# Patient Record
Sex: Male | Born: 1965 | Race: White | Hispanic: No | Smoking: Former smoker
Health system: Southern US, Community
[De-identification: ages and names within clinical notes are randomized; demographics above are authoritative.]

## PROBLEM LIST (undated history)

## (undated) DIAGNOSIS — I5022 Chronic systolic (congestive) heart failure: Secondary | ICD-10-CM

## (undated) DIAGNOSIS — N200 Calculus of kidney: Secondary | ICD-10-CM

## (undated) DIAGNOSIS — E559 Vitamin D deficiency, unspecified: Secondary | ICD-10-CM

## (undated) DIAGNOSIS — Z7901 Long term (current) use of anticoagulants: Secondary | ICD-10-CM

## (undated) DIAGNOSIS — T884XXA Failed or difficult intubation, initial encounter: Secondary | ICD-10-CM

## (undated) DIAGNOSIS — Z79620 Long term (current) use of immunosuppressive biologic: Secondary | ICD-10-CM

## (undated) DIAGNOSIS — I428 Other cardiomyopathies: Secondary | ICD-10-CM

## (undated) DIAGNOSIS — I509 Heart failure, unspecified: Secondary | ICD-10-CM

## (undated) DIAGNOSIS — E119 Type 2 diabetes mellitus without complications: Secondary | ICD-10-CM

## (undated) DIAGNOSIS — F32A Depression, unspecified: Secondary | ICD-10-CM

## (undated) DIAGNOSIS — I4819 Other persistent atrial fibrillation: Secondary | ICD-10-CM

## (undated) DIAGNOSIS — E669 Obesity, unspecified: Secondary | ICD-10-CM

## (undated) DIAGNOSIS — I340 Nonrheumatic mitral (valve) insufficiency: Secondary | ICD-10-CM

## (undated) DIAGNOSIS — R7303 Prediabetes: Secondary | ICD-10-CM

## (undated) DIAGNOSIS — L400 Psoriasis vulgaris: Secondary | ICD-10-CM

## (undated) DIAGNOSIS — Z87442 Personal history of urinary calculi: Secondary | ICD-10-CM

## (undated) DIAGNOSIS — R079 Chest pain, unspecified: Secondary | ICD-10-CM

## (undated) DIAGNOSIS — G4733 Obstructive sleep apnea (adult) (pediatric): Secondary | ICD-10-CM

## (undated) DIAGNOSIS — G454 Transient global amnesia: Secondary | ICD-10-CM

## (undated) DIAGNOSIS — F419 Anxiety disorder, unspecified: Secondary | ICD-10-CM

## (undated) DIAGNOSIS — G709 Myoneural disorder, unspecified: Secondary | ICD-10-CM

## (undated) DIAGNOSIS — K760 Fatty (change of) liver, not elsewhere classified: Secondary | ICD-10-CM

## (undated) DIAGNOSIS — K8020 Calculus of gallbladder without cholecystitis without obstruction: Secondary | ICD-10-CM

## (undated) HISTORY — DX: Calculus of kidney: N20.0

## (undated) HISTORY — DX: Other persistent atrial fibrillation: I48.19

## (undated) HISTORY — DX: Prediabetes: R73.03

## (undated) HISTORY — PX: CARDIOVASCULAR STRESS TEST: SHX262

## (undated) HISTORY — DX: Obesity, unspecified: E66.9

## (undated) HISTORY — DX: Obstructive sleep apnea (adult) (pediatric): G47.33

## (undated) HISTORY — DX: Chest pain, unspecified: R07.9

## (undated) HISTORY — DX: Nonrheumatic mitral (valve) insufficiency: I34.0

## (undated) HISTORY — DX: Other cardiomyopathies: I42.8

## (undated) HISTORY — PX: TONSILLECTOMY AND ADENOIDECTOMY: SUR1326

## (undated) HISTORY — DX: Chronic systolic (congestive) heart failure: I50.22

## (undated) HISTORY — DX: Transient global amnesia: G45.4

## (undated) HISTORY — DX: Fatty (change of) liver, not elsewhere classified: K76.0

---

## 1998-08-29 HISTORY — PX: COLONOSCOPY: SHX174

## 2011-08-30 DIAGNOSIS — I4819 Other persistent atrial fibrillation: Secondary | ICD-10-CM

## 2011-08-30 HISTORY — DX: Other persistent atrial fibrillation: I48.19

## 2011-10-10 ENCOUNTER — Encounter (HOSPITAL_COMMUNITY): Payer: Self-pay | Admitting: Psychology

## 2011-10-10 ENCOUNTER — Ambulatory Visit (INDEPENDENT_AMBULATORY_CARE_PROVIDER_SITE_OTHER): Payer: PRIVATE HEALTH INSURANCE | Admitting: Psychology

## 2011-10-10 DIAGNOSIS — F329 Major depressive disorder, single episode, unspecified: Secondary | ICD-10-CM

## 2011-10-10 LAB — VITAMIN D 25 HYDROXY (VIT D DEFICIENCY, FRACTURES): Vit D, 25-Hydroxy: 19.1

## 2011-10-10 LAB — LIPID PANEL
Cholesterol, Total: 175
Direct LDL: 105
HDL: 51 mg/dL (ref 35–70)
Triglyceride fasting, serum: 93

## 2011-10-10 LAB — COMPREHENSIVE METABOLIC PANEL
Albumin: 4.1
Calcium: 9.2 mg/dL
Phosphorus: 3.7 mg/dL (ref 2.5–4.9)
Potassium: 4.8 mmol/L
Sodium: 143 mmol/L (ref 137–147)
Total Protein: 7.1 g/dL

## 2011-10-10 LAB — CBC: Hemoglobin: 15.4 g/dL (ref 13.5–17.5)

## 2011-10-10 LAB — LACTATE DEHYDROGENASE: LDH: 168 U/L

## 2011-10-10 LAB — HEMOGLOBIN A1C: A1c: 5.8
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
## 2011-10-25 ENCOUNTER — Encounter (HOSPITAL_COMMUNITY): Payer: Self-pay | Admitting: Psychology

## 2011-11-07 ENCOUNTER — Ambulatory Visit (INDEPENDENT_AMBULATORY_CARE_PROVIDER_SITE_OTHER): Payer: PRIVATE HEALTH INSURANCE | Admitting: Psychology

## 2011-11-07 DIAGNOSIS — F329 Major depressive disorder, single episode, unspecified: Secondary | ICD-10-CM

## 2011-11-08 ENCOUNTER — Encounter (HOSPITAL_COMMUNITY): Payer: Self-pay | Admitting: Psychology
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
## 2012-04-09 ENCOUNTER — Encounter: Payer: Self-pay | Admitting: Family Medicine

## 2012-04-09 ENCOUNTER — Ambulatory Visit (INDEPENDENT_AMBULATORY_CARE_PROVIDER_SITE_OTHER): Payer: PRIVATE HEALTH INSURANCE | Admitting: Family Medicine

## 2012-04-09 VITALS — BP 134/82 | HR 80 | Temp 98.2°F | Ht 69.0 in | Wt 217.8 lb

## 2012-04-09 DIAGNOSIS — F430 Acute stress reaction: Secondary | ICD-10-CM

## 2012-04-09 DIAGNOSIS — I480 Paroxysmal atrial fibrillation: Secondary | ICD-10-CM | POA: Insufficient documentation

## 2012-04-09 DIAGNOSIS — I499 Cardiac arrhythmia, unspecified: Secondary | ICD-10-CM

## 2012-04-09 DIAGNOSIS — F411 Generalized anxiety disorder: Secondary | ICD-10-CM | POA: Insufficient documentation

## 2012-04-09 DIAGNOSIS — I4821 Permanent atrial fibrillation: Secondary | ICD-10-CM | POA: Insufficient documentation

## 2012-04-09 DIAGNOSIS — Z23 Encounter for immunization: Secondary | ICD-10-CM

## 2012-04-09 DIAGNOSIS — F439 Reaction to severe stress, unspecified: Secondary | ICD-10-CM

## 2012-04-09 MED ORDER — ASPIRIN 81 MG PO TBEC
81.0000 mg | DELAYED_RELEASE_TABLET | Freq: Every day | ORAL | Status: DC
Start: 2012-04-09 — End: 2012-05-22

## 2012-04-09 MED ORDER — METOPROLOL TARTRATE 25 MG PO TABS: 25.0000 mg | ORAL_TABLET | Freq: Two times a day (BID) | ORAL | Status: DC | PRN

## 2012-04-09 NOTE — Progress Notes (Signed)
Subjective:    Patient ID: Stanley Kane, male    DOB: 04-15-1966, 46 y.o.   MRN: [id number]  HPI CC: new pt to establish  Father dx with liver cancer 1 yr ago - non drinker..  Recently several trips to hospital.  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   Increased stress recently with father and work.    Yesterday started with left posterior neck pain.  Using heating pad.  Prior loved to work out at gym, not in last few years.  Trying to start walking daily - actually lost 20lbs.  Stopped drinking soft drinks.  Increasing anxiety - with chest discomfort - described as tightness in mid chest, comes on with worsened anxiety.  Not exertional - doesn't happen when walking.  No associated SOB, nausea.  Sometimes with sleep initiation insomnia - feels worrying about things excessively.  Actually 1 wk ago did have episode of DOE when was walking down block to restaurant in [place].  First time this has happened.  His GF has told him for months that his heart at times feels like it's racing, but pt doesn't think really recognizes this.  Recently had blood work via company, told normal values. Recent stress test - at [facility], told arrhythmias but not to worry (?PAC/PVC).  Thinks done at [facility].  Have requested records today.  Body mass index is 32.16 kg/(m^2).  Caffeine: 1 cup coffee Lives with GF ([name] Smith), 1 dog Occupation: truck Hospital doctor Edu: 9th grade, GED Activity: enjoys gym but no time, starting walking Diet: good water, fruits/vegetables daily  Preventative: Blood work - done at work, will request records for [place].  Medications and allergies reviewed and updated in chart.  Past histories reviewed and updated if relevant as below. There is no problem list on file for this patient.  Past Medical History  Diagnosis Date  . Arrhythmia     "skips a beat occasionally"   Past Surgical History  Procedure Date  . Tonsillectomy and adenoidectomy childhood  . Cardiovascular stress  test ~2005    thinks at [facility], told normal   History  Substance Use Topics  . Smoking status: Former Smoker    Quit date: 08/29/1996  . Smokeless tobacco: Never Used  . Alcohol Use: 1.2 oz/week    2 Cans of beer per week     Occasional   Family History  Problem Relation Age of Onset  . Hypertension Father   . Cancer Father     liver  . Diabetes Mother   . Coronary artery disease Father 38  . Stroke Neg Hx    Allergies  Allergen Reactions  . Vicodin (Hydrocodone-Acetaminophen)     Makes him "jittery"   No current outpatient prescriptions on file prior to visit.     Review of Systems  Constitutional: Negative for fever, chills, activity change, appetite change, fatigue and unexpected weight change.  HENT: Negative for hearing loss and neck pain.   Eyes: Positive for visual disturbance (due for vision screen).  Respiratory: Positive for chest tightness (see HPI) and shortness of breath (rare - see HPI). Negative for cough and wheezing.   Cardiovascular: Negative for chest pain, palpitations and leg swelling.  Gastrointestinal: Negative for nausea, vomiting, abdominal pain, diarrhea, constipation, blood in stool and abdominal distention.  Genitourinary: Negative for hematuria and difficulty urinating.  Musculoskeletal: Negative for myalgias and arthralgias.  Skin: Negative for rash.  Neurological: Negative for dizziness, seizures, syncope and headaches.  Hematological: Does not bruise/bleed easily.  Psychiatric/Behavioral:  Negative for dysphoric mood. The patient is nervous/anxious.        Objective:   Physical Exam  Nursing note and vitals reviewed. Constitutional: He is oriented to person, place, and time. He appears well-developed and well-nourished. No distress.  HENT:  Head: Normocephalic and atraumatic.  Right Ear: External ear normal.  Left Ear: External ear normal.  Nose: Nose normal.  Mouth/Throat: Oropharynx is clear and moist.  Eyes: Conjunctivae and  EOM are normal. Pupils are equal, round, and reactive to light.  Neck: Normal range of motion. Neck supple. No thyromegaly present.  Cardiovascular: Normal rate, normal heart sounds and intact distal pulses.  An irregularly irregular rhythm present.  No murmur heard. Pulses:      Radial pulses are 2+ on the right side, and 2+ on the left side.  Pulmonary/Chest: Effort normal and breath sounds normal. No respiratory distress. He has no wheezes. He has no rales.  Abdominal: Soft. Bowel sounds are normal. He exhibits no distension and no mass. There is no tenderness. There is no rebound and no guarding.  Musculoskeletal: Normal range of motion. He exhibits no edema.  Lymphadenopathy:    He has no cervical adenopathy.  Neurological: He is alert and oriented to person, place, and time.       CN grossly intact, station and gait intact  Skin: Skin is warm and dry. No rash noted.  Psychiatric: He has a normal mood and affect. His behavior is normal. Judgment and thought content normal.      Assessment & Plan:  Tdap today.

## 2012-04-09 NOTE — Patient Instructions (Addendum)
Start baby aspirin 81mg  once daily. Use metoprolol to slow heart rate if feeling palpitations. Pass by Marion's office to schedule cardiology appointment. Return to see me in 3 months, sooner if needed. Good to meet you today, call us with questions. If worsening chest pressure, or shortness of breath, or speeding heart, seek urgent care.

## 2012-04-10 LAB — TSH: TSH: 1.75 u[IU]/mL (ref 0.35–5.50)

## 2012-04-10 NOTE — Assessment & Plan Note (Signed)
Anticipate stress contributing. Discussed healthy ways of coping with stress. Will w/u cardiac issue first, then reassess stress (anticipate improved sxs after treated)

## 2012-04-10 NOTE — Assessment & Plan Note (Addendum)
New afib, less likely atrial flutter, unclear how long but sounds like months in duration. Currently no evidence of decompensation so will pursue w/u outpatient. Have requested recent lab results from work, checked TSH today as well. EKG today - aAfib at rate of 108. Start aspirin at 81mg  daily, refer to cardiology for consideration of further treatment, likely will need echo. Given endorses h/o palpitations/racing heart, provided with metoprolol to take 25mg  1/2 to 1 pill prn tachycardia. Red flags to seek urgent care or ER discussed as well. Avoid restarting exercise routine until eval by cards.

## 2012-04-13 ENCOUNTER — Ambulatory Visit (INDEPENDENT_AMBULATORY_CARE_PROVIDER_SITE_OTHER): Payer: PRIVATE HEALTH INSURANCE | Admitting: Cardiovascular Disease

## 2012-04-13 ENCOUNTER — Encounter: Payer: Self-pay | Admitting: Cardiovascular Disease

## 2012-04-13 VITALS — BP 100/80 | HR 113 | Ht 69.0 in | Wt 218.0 lb

## 2012-04-13 DIAGNOSIS — F439 Reaction to severe stress, unspecified: Secondary | ICD-10-CM

## 2012-04-13 DIAGNOSIS — R0989 Other specified symptoms and signs involving the circulatory and respiratory systems: Secondary | ICD-10-CM

## 2012-04-13 DIAGNOSIS — R0602 Shortness of breath: Secondary | ICD-10-CM

## 2012-04-13 DIAGNOSIS — R079 Chest pain, unspecified: Secondary | ICD-10-CM

## 2012-04-13 DIAGNOSIS — F430 Acute stress reaction: Secondary | ICD-10-CM

## 2012-04-13 DIAGNOSIS — I4891 Unspecified atrial fibrillation: Secondary | ICD-10-CM

## 2012-04-13 DIAGNOSIS — R0683 Snoring: Secondary | ICD-10-CM | POA: Insufficient documentation

## 2012-04-13 MED ORDER — RIVAROXABAN 20 MG PO TABS: 20.0000 mg | ORAL_TABLET | Freq: Every day | ORAL | Status: DC

## 2012-04-13 MED ORDER — DIGOXIN 250 MCG PO TABS: 0.2500 mg | ORAL_TABLET | Freq: Every day | ORAL | Status: DC

## 2012-04-13 NOTE — Assessment & Plan Note (Addendum)
His wife reports significant snoring even despite recent 20 pound weight loss. Possible periods of apnea. We did discuss with him at some point possibly doing a sleep study. He has nasal congestion. We have suggested he try zyrtec. He also tried nasal strips. Perhaps when he has nasal congestion, his snoring might be worse. We did discuss that obstructive sleep apnea is a risk factor for recurrent atrial fibrillation.

## 2012-04-13 NOTE — Assessment & Plan Note (Addendum)
Blood pressure is low, unable to take regular metoprolol or calcium channel blocker. We'll start digoxin 0.25 mg daily. We'll check a digoxin level when he comes in for echocardiogram.  We'll start him on anticoagulation one month and then discuss further options with him. If he continues to be in atrial fibrillation, depending on his echocardiogram, options include antiarrhythmic medications and /or cardioversion

## 2012-04-13 NOTE — Assessment & Plan Note (Signed)
He does have significant stress, typically while driving. Uncertain if this could have contributed to his arrhythmia.   
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
## 2012-04-13 NOTE — Patient Instructions (Addendum)
Please start digoxin one a day (two the first day)  Start xarelto one a day for blood thinning Monitor your pulse   Take metoprolol 1/2 pill for significant shortness of breath or tachycardia  We will schedule you for an echocardiogram  Please call us if you have new issues that need to be addressed before your next appt.  Your physician wants you to follow-up in: 1 months.   Atrial Fibrillation (Irregular Heartbeat) Your caregiver has diagnosed you with atrial fibrillation (AFib). The heart normally beats very regularly; AFib is a type of irregular heartbeat. The heart rate may be faster or slower than normal. This can prevent your heart from pumping as well as it should. AFib can be constant (chronic) or intermittent (paroxysmal). CAUSES Atrial fibrillation may be caused by:  Heart disease, including heart attack, coronary artery disease, heart failure, diseases of the heart valves, and others.   Blood clot in the lungs (pulmonary embolism).   Pneumonia or other infections.   Chronic lung disease.   Thyroid disease.   Toxins. These include alcohol, some medications (such as decongestant medications or diet pills), and caffeine.  In some people, no cause for AFib can be found. This is referred to as Lone Atrial Fibrillation. SYMPTOMS  Palpitations or a fluttering in your chest.   A vague sense of chest discomfort.   Shortness of breath.   Sudden onset of lightheadedness or weakness.  Sometimes, the first sign of AFib can be a complication of the condition. This could be a stroke or heart failure. DIAGNOSIS Your description of your condition may make your caregiver suspicious of atrial fibrillation. Your caregiver will examine your pulse to determine if fibrillation is present. An EKG (electrocardiogram) will confirm the diagnosis. Further testing may help determine what caused you to have atrial fibrillation. This may include chest x-ray, echocardiogram, blood tests, or CT  scans. PREVENTION If you have previously had atrial fibrillation, your caregiver may advise you to avoid substances known to cause the condition (such as stimulant medications, and possibly caffeine or alcohol). You may be advised to use medications to prevent recurrence. Proper treatment of any underlying condition is important to help prevent recurrence. PROGNOSIS Atrial fibrillation does tend to become a chronic condition over time. It can cause significant complications (see below). Atrial fibrillation is not usually immediately life-threatening, but it can shorten your life expectancy. This seems to be worse in women. If you have lone atrial fibrillation and are under 69 years old, the risk of complications is very low, and life expectancy is not shortened. RISKS AND COMPLICATIONS Complications of atrial fibrillation can include stroke, chest pain, and heart failure. Your caregiver will recommend treatments for the atrial fibrillation, as well as for any underlying conditions, to help minimize risk of complications. TREATMENT Treatment for AFib is divided into several categories:  Treatment of any underlying condition.   Converting you out of AFib into a regular (sinus) rhythm.   Controlling rapid heart rate.   Prevention of blood clots and stroke.  Medications and procedures are available to convert your atrial fibrillation to sinus rhythm. However, recent studies have shown that this may not offer you any advantage, and cardiac experts are continuing research and debate on this topic. More important is controlling your rapid heartbeat. The rapid heartbeat causes more symptoms, and places strain on your heart. Your caregiver will advise you on the use of medications that can control your heart rate. Atrial fibrillation is a strong stroke risk. You can  lessen this risk by taking blood thinning medications such as Coumadin (warfarin), or sometimes aspirin. These medications need close  monitoring by your caregiver. Over-medication can cause bleeding. Too little medication may not protect against stroke. HOME CARE INSTRUCTIONS  If your caregiver prescribed medicine to make your heartbeat more normally, take as directed.   If blood thinners were prescribed by your caregiver, take EXACTLY as directed.   Perform blood tests EXACTLY as directed.   Quit smoking. Smoking increases your cardiac and lung (pulmonary) risks.   DO NOT drink alcohol.   DO NOT drink caffeinated drinks (e.g. coffee, pop, chocolate, and leaf teas). You may drink decaffeinated coffee, pop or tea.   If you are overweight, you should choose a reduced calorie diet to lose weight. Please see a registered dietitian if you need more information about healthy weight loss. DO NOT USE DIET PILLS as they may aggravate heart problems.   If you have other heart problems that are causing AFib, you may need to eat a low salt, fat, and cholesterol diet. Your caregiver will tell you if this is necessary.   Exercise every day to improve your physical fitness. Stay active unless advised otherwise.   If your caregiver has given you a follow-up appointment, it is very important to keep that appointment. Not keeping the appointment could result in heart failure or stroke. If there is any problem keeping the appointment, you must call back to this facility for assistance.  SEEK MEDICAL CARE IF:  You notice a change in the rate, rhythm or strength of your heartbeat.   You develop an infection or any other change in your overall health status.  SEEK IMMEDIATE MEDICAL CARE IF:  You develop chest pain, abdominal pain, sweating, weakness or feel sick to your stomach (nausea).   You develop shortness of breath.   You develop swollen feet and ankles.   You develop dizziness, numbness, or weakness of your face or limbs, or any change in vision or speech.  MAKE SURE YOU:  Understand these instructions.   Will watch your  condition.   Will get help right away if you are not doing well or get worse.  Document Released: 08/15/2005 Document Re-Released: 02/02/2010 [facility] Patient Information 2011 [place], [place].

## 2012-04-25 ENCOUNTER — Telehealth: Payer: Self-pay

## 2012-04-25 NOTE — Telephone Encounter (Signed)
Lets have pt come in for OV.  i could see tomorrow.

## 2012-04-25 NOTE — Telephone Encounter (Signed)
[name] left v/m pt having extreme anxiety; request low dose med for anxiety. Did not leave pharmacy info. Left v/m for candice to call back.

## 2012-04-25 NOTE — Telephone Encounter (Signed)
I had to leave a message for Stanley Kane advising he needs to be seen. Advised I placed him on schedule at 3:30 tomorrow to give him to get back. If he gets back earlier and needs earlier appt, then she could call and reschedule for tomorrow morning. Otherwise, keep appt for tomorrow afternoon or seek urgent care tonight.

## 2012-04-25 NOTE — Telephone Encounter (Signed)
Stanley Kane said stress level out the roof. 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 Pt has seen cardiologist and taking xarelto and digoxin. Pt is not taking metoprolol at this time.[facility].

## 2012-04-25 NOTE — Telephone Encounter (Signed)
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  He called her to say his stress level is "out the roof". She asks if there is any med for anxiety that can be called in for pt. I explained Dr. Mariah Milling is on vacation and usually does not prescribe these types of meds. I advised her to call PCP about anxiety.  Understanding verb.

## 2012-04-26 ENCOUNTER — Ambulatory Visit: Payer: Self-pay | Admitting: Family Medicine

## 2012-05-01 ENCOUNTER — Other Ambulatory Visit: Payer: Self-pay

## 2012-05-02 ENCOUNTER — Telehealth: Payer: Self-pay | Admitting: Cardiovascular Disease
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
## 2012-05-02 NOTE — Telephone Encounter (Signed)
Pt informed.  He wishes to keep 9/24 echo as scheduled but will call us sooner should his symptoms change/get worse.  He will try the 1/2 dose of metoprolol for elevated HR and palpitations.

## 2012-05-02 NOTE — Telephone Encounter (Signed)
Spoke with [name]; she states pt is feeling better but Stanley Kane will talk with pt to see if he feels he needs to come in for anxiety med. If pt needs appt [name] will call back to schedule.

## 2012-05-02 NOTE — Telephone Encounter (Signed)
LMTCB

## 2012-05-02 NOTE — Telephone Encounter (Signed)
FYI See below 

## 2012-05-02 NOTE — Telephone Encounter (Signed)
Pt GF called and states that pt is out on the road and states that a few days ago pt had severe chest pain with increased HR. No nitro or vomiting. Pt didn't take the metoprolol that was prescribed in the case of this happening.

## 2012-05-02 NOTE — Telephone Encounter (Signed)
Sounds good. Could repeat 1/2 metoprolol if needed if rate does not improve

## 2012-05-02 NOTE — Telephone Encounter (Signed)
Per Lela in [place], no echo's available for 9/6. I will call to inform pt.

## 2012-05-03 NOTE — Telephone Encounter (Signed)
Pt informed.  Understanding verb. Says he is feeling well today. He took 1 whole tablet of metoprolol yesterday and today.

## 2012-05-07 ENCOUNTER — Encounter: Payer: Self-pay | Admitting: Family Medicine

## 2012-05-21 ENCOUNTER — Ambulatory Visit: Payer: Self-pay | Admitting: Cardiovascular Disease

## 2012-05-22 ENCOUNTER — Ambulatory Visit (INDEPENDENT_AMBULATORY_CARE_PROVIDER_SITE_OTHER): Payer: PRIVATE HEALTH INSURANCE | Admitting: Cardiovascular Disease

## 2012-05-22 ENCOUNTER — Other Ambulatory Visit: Payer: Self-pay

## 2012-05-22 ENCOUNTER — Encounter: Payer: Self-pay | Admitting: Cardiovascular Disease

## 2012-05-22 ENCOUNTER — Other Ambulatory Visit (INDEPENDENT_AMBULATORY_CARE_PROVIDER_SITE_OTHER): Payer: PRIVATE HEALTH INSURANCE

## 2012-05-22 VITALS — BP 114/79 | HR 87 | Ht 69.0 in | Wt 218.5 lb

## 2012-05-22 DIAGNOSIS — R079 Chest pain, unspecified: Secondary | ICD-10-CM

## 2012-05-22 DIAGNOSIS — I4891 Unspecified atrial fibrillation: Secondary | ICD-10-CM

## 2012-05-22 DIAGNOSIS — R0602 Shortness of breath: Secondary | ICD-10-CM | POA: Insufficient documentation

## 2012-05-22 MED ORDER — AMIODARONE HCL 400 MG PO TABS: 400.0000 mg | ORAL_TABLET | Freq: Every day | ORAL | Status: DC

## 2012-05-22 NOTE — Patient Instructions (Addendum)
Please start amiodarone 400 mg twice a day for 5 days Then decrease the  Dose to 200 mg twice a day  We will schedule an ekg in 2 weeks  Please call us if you have new issues that need to be addressed before your next appt.  Your physician wants you to follow-up in: 1 months.

## 2012-05-22 NOTE — Progress Notes (Signed)
Patient ID: Stanley Kane, male    DOB: 28-Apr-1966, 47 y.o.   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  He reports that he is unable to tolerate metoprolol twice a day. He's been taking this once in the morning only. Heart rate in the morning is typically elevated in the 120 range when he gets up for his morning pill. He does have malaise, shortness of breath.  Continues To have significant stress while driving.  Echocardiogram today shows ejection fraction 50%, mild dilatation of the left atrium, no significant valvular disease   He reports having a stress test at [facility] 6 years ago. This was reportedly normal.  EKG today shows atrial fibrillation with rate 87 beats per minute, no significant ST or T wave changes   Outpatient Encounter Prescriptions as of 05/22/2012  Medication Sig Dispense Refill  . digoxin (LANOXIN) 0.25 MG tablet Take 1 tablet (0.25 mg total) by mouth daily.  30 tablet  6  . metoprolol tartrate (LOPRESSOR) 25 MG tablet Take 25 mg by mouth daily.      . Multiple Vitamins-Minerals (MULTIVITAMIN PO) Take 1 tablet by mouth daily.      . Rivaroxaban (XARELTO) 20 MG TABS Take 1 tablet (20 mg total) by mouth daily.  30 tablet  6    Review of Systems  Constitutional: Positive for fatigue.  HENT: Negative.   Eyes: Negative.   Respiratory: Positive for shortness of breath.   Cardiovascular: Positive for palpitations.  Gastrointestinal: Negative.   Musculoskeletal: Negative.   Skin: Negative.   Neurological: Negative.   Hematological: Negative.   Psychiatric/Behavioral: Negative.   All other systems reviewed and are negative.    BP 114/79  Pulse 87  Ht 5\' 9"  (1.753 m)  Wt 218 lb 8 oz (99.111 kg)  BMI 32.27 kg/m2  Physical Exam  Nursing note and vitals reviewed. Constitutional: He is  oriented to person, place, and time. He appears well-developed and well-nourished.  HENT:  Head: Normocephalic.  Nose: Nose normal.  Mouth/Throat: Oropharynx is clear and moist.  Eyes: Conjunctivae normal are normal. Pupils are equal, round, and reactive to light.  Neck: Normal range of motion. Neck supple. No JVD present.  Cardiovascular: S1 normal, S2 normal, normal heart sounds and intact distal pulses.  An irregularly irregular rhythm present. Tachycardia present.  Exam reveals no gallop and no friction rub.   No murmur heard. Pulmonary/Chest: Effort normal and breath sounds normal. No respiratory distress. He has no wheezes. He has no rales. He exhibits no tenderness.  Abdominal: Soft. Bowel sounds are normal. He exhibits no distension. There is no tenderness.  Musculoskeletal: Normal range of motion. He exhibits no edema and no tenderness.  Lymphadenopathy:    He has no cervical adenopathy.  Neurological: He is alert and oriented to person, place, and time. Coordination normal.  Skin: Skin is warm and dry. No rash noted. No erythema.  Psychiatric: He has a normal mood and affect. His behavior is normal. Judgment and thought content normal.           Assessment and Plan     Atrial fibrillation -  We'll start him on amiodarone 400 mg twice a day for 5 days then decrease the dose to 200 mg twice a day . EKG in 2 weeks' time. If he remains in atrial fibrillation, we'll schedule him  for cardioversion . Continue other current medications, try to increase metoprolol to twice a day .  Stress - He does have significant stress, typically while driving. Uncertain if this could have contributed to his arrhythmia.  Snoring -   We did discuss that obstructive sleep apnea is a risk factor for recurrent atrial fibrillation. He is not interested in a sleep study at this time   Shortness of breath  Likely secondary to underlying atrial fibrillation. Possible contribution from being  deconditioned and obese .

## 2012-05-28 NOTE — Telephone Encounter (Signed)
Pt wanted to clarify instructions for amiodarone. He says bottle says something different than AVS printout. I reviewed instructions with pt. He understands now and will call us when he is in town for EKG.  He mentions left chest/arm pain that gets worse with movement.  I reassured him this is usually non cardiac. He had a stress test in 2006 that was normal.  He will pay close att'n to these symptoms and let us know should these get worse/change.

## 2012-05-28 NOTE — Telephone Encounter (Signed)
Pt calling not sure of the directions of the amiodarone.

## 2012-05-28 NOTE — Telephone Encounter (Signed)
LMTCB

## 2012-05-28 NOTE — Telephone Encounter (Signed)
FYI See below 

## 2012-06-05 ENCOUNTER — Ambulatory Visit (INDEPENDENT_AMBULATORY_CARE_PROVIDER_SITE_OTHER): Payer: PRIVATE HEALTH INSURANCE

## 2012-06-05 VITALS — BP 110/72 | HR 65 | Ht 69.0 in | Wt 221.0 lb

## 2012-06-05 DIAGNOSIS — R0602 Shortness of breath: Secondary | ICD-10-CM

## 2012-06-05 DIAGNOSIS — I4891 Unspecified atrial fibrillation: Secondary | ICD-10-CM

## 2012-06-05 NOTE — Patient Instructions (Addendum)
We will tentatively schedule cardioversion for 06/27/12 We will call you to confirm  Return for labs 06/18/12

## 2012-06-05 NOTE — Progress Notes (Signed)
Pt here for EKG after having started amiodarone at last OV. Dr. Mariah Milling wanted pt to come in for EKG 2 weeks after start and schedule cardioversion if pt still in atrial fib. EKG shows atrial fib with a rate of 65 BPM.  Pt is asymptomatic. Says his HR has improved tremendously. Denies worsening sob or fatigue. He confirms compliance with xarelto as well. 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  We will tentatively arrange this and have him come in for labs 06/18/12. Understanding verb. We will give him detailed instruction re: cardioversion at 10/21 visit.

## 2012-06-06 ENCOUNTER — Other Ambulatory Visit: Payer: Self-pay

## 2012-06-06 ENCOUNTER — Telehealth: Payer: Self-pay

## 2012-06-06 DIAGNOSIS — I4891 Unspecified atrial fibrillation: Secondary | ICD-10-CM

## 2012-06-06 NOTE — Telephone Encounter (Signed)
Spoke with pt's fiance, who says pt has decided to not have cardioversion 10/30. They will be on vacation that week. They would like to have this done 07/06/12. This has been scheduled for 11/8 for Dr. Mariah Milling to do.  He will come for labs/CXR/EKG 07/02/12. Instructions mailed to pt.

## 2012-06-14 ENCOUNTER — Telehealth: Payer: Self-pay | Admitting: Cardiovascular Disease

## 2012-06-14 ENCOUNTER — Other Ambulatory Visit: Payer: Self-pay

## 2012-06-14 MED ORDER — METOPROLOL TARTRATE 25 MG PO TABS: 12.5000 mg | ORAL_TABLET | Freq: Every day | ORAL | Status: DC

## 2012-06-14 NOTE — Telephone Encounter (Signed)
Pt's fiance says pt has been very fatigued and even had to call out of work yesterday, which he never does.  I had her check his HR while I was on the phone. It is between 80-90 BPM.   I advised pt to decrease metoprolol to 12.5 mg daily for a few days and see if this helps symptoms. They will call us back should they not notice a change

## 2012-06-14 NOTE — Telephone Encounter (Signed)
Pt's fiance called to see if the patient can back off of his Metoprolol since it is making him very fatigued.  Please call pt to discuss options.

## 2012-06-20 ENCOUNTER — Other Ambulatory Visit: Payer: Self-pay

## 2012-06-27 NOTE — Addendum Note (Signed)
Addended by: Indiana Pechacek J on: 06/27/2012 12:30 AM   Modules accepted: Level of Service  

## 2012-07-02 ENCOUNTER — Other Ambulatory Visit: Payer: Self-pay

## 2012-07-02 ENCOUNTER — Ambulatory Visit (INDEPENDENT_AMBULATORY_CARE_PROVIDER_SITE_OTHER): Payer: PRIVATE HEALTH INSURANCE

## 2012-07-02 ENCOUNTER — Ambulatory Visit: Payer: Self-pay | Admitting: Cardiovascular Disease

## 2012-07-02 ENCOUNTER — Other Ambulatory Visit: Payer: Self-pay | Admitting: Cardiovascular Disease

## 2012-07-02 VITALS — BP 104/64 | HR 79 | Ht 69.0 in | Wt 216.0 lb

## 2012-07-02 DIAGNOSIS — I4891 Unspecified atrial fibrillation: Secondary | ICD-10-CM

## 2012-07-02 DIAGNOSIS — R0602 Shortness of breath: Secondary | ICD-10-CM

## 2012-07-03 LAB — CBC WITH DIFFERENTIAL
Basophils Absolute: 0.1 10*3/uL (ref 0.0–0.2)
Eosinophils Absolute: 0.1 10*3/uL (ref 0.0–0.4)
Immature Granulocytes: 0 % (ref 0–2)
Lymphocytes Absolute: 2.1 10*3/uL (ref 0.7–3.1)
MCH: 32.8 pg (ref 26.6–33.0)
MCHC: 34.3 g/dL (ref 31.5–35.7)
MCV: 96 fL (ref 79–97)
Monocytes Absolute: 1 10*3/uL — ABNORMAL HIGH (ref 0.1–0.9)
Platelets: 349 10*3/uL (ref 155–379)
RDW: 13.8 % (ref 12.3–15.4)

## 2012-07-03 LAB — BASIC METABOLIC PANEL
CO2: 18 mmol/L — ABNORMAL LOW (ref 19–28)
Calcium: 9.3 mg/dL (ref 8.7–10.2)
Glucose: 104 mg/dL — ABNORMAL HIGH (ref 65–99)
Potassium: 4.4 mmol/L (ref 3.5–5.2)
Sodium: 139 mmol/L (ref 134–144)

## 2012-07-06 ENCOUNTER — Ambulatory Visit: Payer: Self-pay | Admitting: Cardiovascular Disease

## 2012-07-06 DIAGNOSIS — I4891 Unspecified atrial fibrillation: Secondary | ICD-10-CM

## 2012-07-17 ENCOUNTER — Telehealth: Payer: Self-pay | Admitting: Cardiovascular Disease

## 2012-07-17 NOTE — Telephone Encounter (Signed)
Fiance called back He is seeing pcp Monday 11/25 at 0915 Wants to see Gollan same day Scheduled for 11/25 at 0745 Understanding verb

## 2012-07-17 NOTE — Telephone Encounter (Signed)
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 He will not know until later today when he is coming back in town His rate is ranging between 80-110 BPM He is asymptomatic and noticed this when he was checking his HR Fiance is also concerned, because prior to leaving for this trip, on Sunday, pt was under a lot of stress and stays stressed out with his job.  She thinks he needs RX for anxiety/stress management She feels until we get stress under better control, atrial fib will continue I explained atrial fib can be exacerbated by stress and may need med for this but Dr. Mariah Milling usually doesn't prescribe and may want to call PCP for this She voices understanding I explained pt is due for EKG anyway post cardioversion and should come in for this when he is back in town Fiance will call me this afternoon, as soon as she finds out when he will be back in town, to make that appt She will try to get appt with PCP same day I will await her call back

## 2012-07-17 NOTE — Telephone Encounter (Signed)
Pt fiance calling stating that pt is in afib and is out of town. Wants to know what she should do.

## 2012-07-23 ENCOUNTER — Ambulatory Visit: Payer: Self-pay | Admitting: Family Medicine

## 2012-07-23 ENCOUNTER — Ambulatory Visit: Payer: Self-pay | Admitting: Cardiovascular Disease

## 2012-07-23 NOTE — Patient Instructions (Signed)
EKG performed for atrial fib

## 2012-07-23 NOTE — Addendum Note (Signed)
Addended by: Antonieta Iba on: 07/23/2012 07:47 AM   Modules accepted: Level of Service

## 2012-09-05 ENCOUNTER — Ambulatory Visit: Payer: Self-pay | Admitting: Family Medicine

## 2012-09-21 ENCOUNTER — Encounter: Payer: Self-pay | Admitting: Cardiovascular Disease

## 2012-09-21 ENCOUNTER — Ambulatory Visit (INDEPENDENT_AMBULATORY_CARE_PROVIDER_SITE_OTHER): Payer: Self-pay | Admitting: Cardiovascular Disease

## 2012-09-21 VITALS — BP 110/72 | HR 74 | Ht 69.0 in | Wt 221.0 lb

## 2012-09-21 DIAGNOSIS — R0683 Snoring: Secondary | ICD-10-CM

## 2012-09-21 DIAGNOSIS — I4891 Unspecified atrial fibrillation: Secondary | ICD-10-CM

## 2012-09-21 DIAGNOSIS — Z733 Stress, not elsewhere classified: Secondary | ICD-10-CM

## 2012-09-21 DIAGNOSIS — R0609 Other forms of dyspnea: Secondary | ICD-10-CM

## 2012-09-21 DIAGNOSIS — R0602 Shortness of breath: Secondary | ICD-10-CM

## 2012-09-21 DIAGNOSIS — F439 Reaction to severe stress, unspecified: Secondary | ICD-10-CM

## 2012-09-21 MED ORDER — DABIGATRAN ETEXILATE MESYLATE 150 MG PO CAPS: 150.0000 mg | ORAL_CAPSULE | Freq: Two times a day (BID) | ORAL | Status: DC

## 2012-09-21 NOTE — Assessment & Plan Note (Signed)
Back in atrial fibrillation. Uncertain if secondary to untreated sleep apnea or other trigger such as stress while driving. We'll restart anticoagulation, pradaxa , 150 mg twice a day. EKG in one month. Stay on amiodarone. We'll reconsider cardioversion at that time.

## 2012-09-21 NOTE — Assessment & Plan Note (Signed)
Palpitations and mild shortness of breath likely secondary to underlying arrhythmia. Would repeat cardioversion if he is willing in one month.

## 2012-09-21 NOTE — Assessment & Plan Note (Addendum)
We'll need to discuss sleep study with him. Encouraged weight loss.
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
## 2012-09-21 NOTE — Assessment & Plan Note (Signed)
He does have significant stress, typically while driving. Uncertain if this could have contributed to his arrhythmia.

## 2012-09-21 NOTE — Patient Instructions (Addendum)
You are doing well. Please start pradaxa twice a day  Please call us if you have new issues that need to be addressed before your next appt.  Your physician wants you to follow-up in: 1 months for EKG, discuss cardioversion You will receive a reminder letter in the mail two months in advance. If you don't receive a letter, please call our office to schedule the follow-up appointment.

## 2012-10-26 ENCOUNTER — Ambulatory Visit: Payer: Self-pay | Admitting: Cardiovascular Disease

## 2012-10-26 ENCOUNTER — Encounter: Payer: Self-pay | Admitting: *Deleted

## 2012-11-03 ENCOUNTER — Other Ambulatory Visit: Payer: Self-pay | Admitting: Cardiovascular Disease

## 2012-11-23 ENCOUNTER — Ambulatory Visit (INDEPENDENT_AMBULATORY_CARE_PROVIDER_SITE_OTHER): Payer: BC Managed Care – PPO | Admitting: Cardiovascular Disease

## 2012-11-23 ENCOUNTER — Encounter: Payer: Self-pay | Admitting: Cardiovascular Disease

## 2012-11-23 VITALS — BP 114/74 | HR 85 | Ht 69.0 in | Wt 216.0 lb

## 2012-11-23 DIAGNOSIS — R0602 Shortness of breath: Secondary | ICD-10-CM

## 2012-11-23 DIAGNOSIS — I4891 Unspecified atrial fibrillation: Secondary | ICD-10-CM

## 2012-11-23 DIAGNOSIS — Z733 Stress, not elsewhere classified: Secondary | ICD-10-CM

## 2012-11-23 DIAGNOSIS — F439 Reaction to severe stress, unspecified: Secondary | ICD-10-CM

## 2012-11-23 DIAGNOSIS — R0989 Other specified symptoms and signs involving the circulatory and respiratory systems: Secondary | ICD-10-CM

## 2012-11-23 DIAGNOSIS — R0683 Snoring: Secondary | ICD-10-CM

## 2012-11-23 MED ORDER — AMIODARONE HCL 200 MG PO TABS: 200.0000 mg | ORAL_TABLET | Freq: Two times a day (BID) | ORAL | Status: DC

## 2012-11-23 NOTE — Assessment & Plan Note (Signed)
Worsening shortness of breath since he is back in atrial fibrillation. He is interested in restoring normal sinus rhythm.

## 2012-11-23 NOTE — Assessment & Plan Note (Signed)
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 Uncertain if this is contributing to recurrence of his arrhythmia versus underlying sleep apnea

## 2012-11-23 NOTE — Assessment & Plan Note (Signed)
He is interested in cardioversion. We will try pharmacologic cardioversion. He would take amiodarone 400 mg twice a day for 5 days then down to 200 mg twice a day with EKG in 10 days to 2 weeks. If he remains in atrial fibrillation, we will schedule cardioversion.

## 2012-11-23 NOTE — Assessment & Plan Note (Signed)
Sleep apnea could continue to be a problem. He is not interested in sleep study at this time

## 2012-11-23 NOTE — Patient Instructions (Addendum)
Please take amiodarone 400 mg twice a day for five days Then decrease amiodarone to 200 mg twice a day  EKG in 10 days to 2 weeks If still in atrial fibrillation, We will schedule a cardioversion  Please call us if you have new issues that need to be addressed before your next appt.  Your physician wants you to follow-up in: 6 weeks

## 2012-11-23 NOTE — Progress Notes (Signed)
Patient ID: Stanley Kane, male    DOB: Jul 17, 1966, 47 y.o.   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 Develop atrial fibrillation in 2013 with cardioversion 07/06/2012, successful restoring of his normal sinus rhythm.  Last clinic visit with recurrent atrial fibrillation started back on anticoagulation.   Previously unable to tolerate metoprolol twice a day. He's taking one half dose twice a day . Suspected obstructive sleep apnea, not on CPAP . Sleep apnea noted during anesthesia of cardioversion Significant stress and anxiety when driving his stroke in the [place] area . Wife reports he has anger issues when driving .  He went for routine DOT physical and this was denied as he was in atrial fibrillation and not on anticoagulation .   Prior  Echocardiogram showed ejection fraction 50%, mild dilatation of the left atrium, no significant valvular disease  He reports having a stress test at [place] 6 years ago. This was reportedly normal.  EKG today shows atrial fibrillation with rate 85 beats per minute, no significant ST or T wave changes   Outpatient Encounter Prescriptions as of 11/23/2012  Medication Sig Dispense Refill  . amiodarone (PACERONE) 200 MG tablet Take 1 tablet (200 mg total) by mouth 2 (two) times daily.  60 tablet  6  . dabigatran (PRADAXA) 150 MG CAPS Take 1 capsule (150 mg total) by mouth every 12 (twelve) hours.  60 capsule  11  . digoxin (LANOXIN) 0.25 MG tablet Take 1 tablet (0.25 mg total) by mouth daily.  30 tablet  6  . metoprolol tartrate (LOPRESSOR) 25 MG tablet Take 0.5 tablets (12.5 mg total) by mouth daily.  15 tablet  3  . Multiple Vitamins-Minerals (MULTIVITAMIN PO) Take 1 tablet by mouth daily.        Review of Systems  Constitutional: Positive for fatigue.  HENT: Negative.    Eyes: Negative.   Respiratory: Positive for shortness of breath.   Cardiovascular: Positive for palpitations.  Gastrointestinal: Negative.   Musculoskeletal: Negative.   Skin: Negative.   Neurological: Negative.   Psychiatric/Behavioral: Negative.   All other systems reviewed and are negative.    BP 114/74  Pulse 85  Ht 5\' 9"  (1.753 m)  Wt 216 lb (97.977 kg)  BMI 31.88 kg/m2  Physical Exam  Nursing note and vitals reviewed. Constitutional: He is oriented to person, place, and time. He appears well-developed and well-nourished.  HENT:  Head: Normocephalic.  Nose: Nose normal.  Mouth/Throat: Oropharynx is clear and moist.  Eyes: Conjunctivae are normal. Pupils are equal, round, and reactive to light.  Neck: Normal range of motion. Neck supple. No JVD present.  Cardiovascular: S1 normal, S2 normal, normal heart sounds and intact distal pulses.  An irregularly irregular rhythm present. Tachycardia present.  Exam reveals no gallop and no friction rub.   No murmur heard. Pulmonary/Chest: Effort normal and breath sounds normal. No respiratory distress. He has no wheezes. He has no rales. He exhibits no tenderness.  Abdominal: Soft. Bowel sounds are normal. He exhibits no distension. There is no tenderness.  Musculoskeletal: Normal range of motion. He exhibits no edema and no tenderness.  Lymphadenopathy:    He has no cervical adenopathy.  Neurological: He is alert and oriented to person, place, and time. Coordination normal.  Skin: Skin is warm and dry. No rash noted. No erythema.  Psychiatric: He  has a normal mood and affect. His behavior is normal. Judgment and thought content normal.      Assessment and Plan

## 2012-11-30 ENCOUNTER — Telehealth: Payer: Self-pay

## 2012-11-30 NOTE — Telephone Encounter (Signed)
Is pt cleared for dental work? Hold anticoag?

## 2012-12-03 NOTE — Telephone Encounter (Signed)
LMTCB

## 2012-12-03 NOTE — Telephone Encounter (Signed)
Do not hold anticoagulation Scheduling for cardioversion if he does not convert on amio Would do cleaning at a later date or do cleaning and dont stop anticoagulation

## 2012-12-05 NOTE — Telephone Encounter (Signed)
lmtcb

## 2012-12-12 ENCOUNTER — Ambulatory Visit (INDEPENDENT_AMBULATORY_CARE_PROVIDER_SITE_OTHER): Payer: BC Managed Care – PPO

## 2012-12-12 VITALS — BP 120/84 | HR 74 | Ht 69.0 in | Wt 224.0 lb

## 2012-12-12 DIAGNOSIS — I4891 Unspecified atrial fibrillation: Secondary | ICD-10-CM

## 2012-12-12 NOTE — Telephone Encounter (Signed)
lmtcb YN:WGNF to have ekg asap

## 2012-12-12 NOTE — Progress Notes (Signed)
Pt here for EKG post amiodarone load No complaints EKG performed Shows atrial fibrillation Per Dr. Windell Hummingbird last note, cardioversion was scheduled for 5/9 He will come for labs and EKG 5/8 Verbal instructions given

## 2012-12-12 NOTE — Patient Instructions (Addendum)
See cardioversion instructions given to you

## 2012-12-31 ENCOUNTER — Telehealth: Payer: Self-pay

## 2012-12-31 NOTE — Telephone Encounter (Signed)
FYI: Pt called to cancel cardioversion scheduled for 01/04/13 due to family emergency He will call back next week  to r/s

## 2013-01-04 ENCOUNTER — Ambulatory Visit: Payer: Self-pay | Admitting: Cardiovascular Disease

## 2013-02-01 ENCOUNTER — Ambulatory Visit: Payer: Self-pay | Admitting: Cardiovascular Disease

## 2013-02-09 ENCOUNTER — Other Ambulatory Visit: Payer: Self-pay | Admitting: Cardiovascular Disease

## 2013-02-11 ENCOUNTER — Other Ambulatory Visit: Payer: Self-pay | Admitting: *Deleted

## 2013-02-11 MED ORDER — DIGOXIN 250 MCG PO TABS: 0.2500 mg | ORAL_TABLET | Freq: Every day | ORAL | Status: DC

## 2013-02-11 NOTE — Telephone Encounter (Signed)
Refilled Digoxin sent to [facility].

## 2013-03-04 ENCOUNTER — Ambulatory Visit (INDEPENDENT_AMBULATORY_CARE_PROVIDER_SITE_OTHER): Payer: BC Managed Care – PPO | Admitting: Cardiovascular Disease

## 2013-03-04 ENCOUNTER — Encounter: Payer: Self-pay | Admitting: Cardiovascular Disease

## 2013-03-04 VITALS — BP 110/78 | HR 66 | Ht 70.0 in | Wt 209.2 lb

## 2013-03-04 DIAGNOSIS — I4891 Unspecified atrial fibrillation: Secondary | ICD-10-CM

## 2013-03-04 DIAGNOSIS — R0602 Shortness of breath: Secondary | ICD-10-CM

## 2013-03-04 MED ORDER — DABIGATRAN ETEXILATE MESYLATE 150 MG PO CAPS: 150.0000 mg | ORAL_CAPSULE | Freq: Two times a day (BID) | ORAL | Status: DC

## 2013-03-04 MED ORDER — DIGOXIN 250 MCG PO TABS: 0.2500 mg | ORAL_TABLET | Freq: Every day | ORAL | Status: DC

## 2013-03-04 MED ORDER — METOPROLOL TARTRATE 25 MG PO TABS: 25.0000 mg | ORAL_TABLET | Freq: Every day | ORAL | Status: DC

## 2013-03-04 NOTE — Patient Instructions (Addendum)
You are doing well. No medication changes were made.  Continue to monitor your rhythm, Cal the office if you think you are in atrial fibrillation and restart pradaxa  Please call us if you have new issues that need to be addressed before your next appt.  Your physician wants you to follow-up in: 6 months.  You will receive a reminder letter in the mail two months in advance. If you don't receive a letter, please call our office to schedule the follow-up appointment.

## 2013-03-04 NOTE — Assessment & Plan Note (Signed)
Maintaining normal sinus rhythm on today's visit. Possibly from recent weight loss,  may be with improved sleep apnea. He stay on digoxin and metoprolol. I've asked them to closely monitor his heart rate with a pulse oximeter. If he can closely monitor this, he could hold his anticoagulation . Resting to come back into the office for any recurrent arrhythmia .

## 2013-03-04 NOTE — Progress Notes (Signed)
Patient ID: Stanley Kane, male    DOB: May 22, 1966, 47 y.o.   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 Developing atrial fibrillation in 2013 with cardioversion 07/06/2012, successful restoring of his normal sinus rhythm.  Previous clinic visit with recurrent atrial fibrillation started back on anticoagulation.   Suspected obstructive sleep apnea, not on CPAP . Sleep apnea noted during anesthesia of cardioversion Significant stress and anxiety when driving his stroke in the [place] area . Wife reports he has anger issues when driving . He went for routine DOT physical and this was denied as he was in atrial fibrillation and not on anticoagulation .   In followup today, he is in normal sinus rhythm. He lists previous visit for cardioversion. His weight is down 25 pounds. He is trying to watch his diet. Overall he feels well with no complaints. He stopped taking amiodarone, only takes anticoagulation, metoprolol, digoxin. He's been out of his metoprolol for several days.  Prior  Echocardiogram showed ejection fraction 50%, mild dilatation of the left atrium, no significant valvular disease  He reports having a stress test at [place] 6 years ago. This was reportedly normal.  EKG shows normal sinus rhythm with rate 65 beats a minute, no significant ST or T wave changes   Outpatient Encounter Prescriptions as of 03/04/2013  Medication Sig Dispense Refill  . dabigatran (PRADAXA) 150 MG CAPS Take 1 capsule (150 mg total) by mouth every 12 (twelve) hours.  60 capsule  11  . digoxin (LANOXIN) 0.25 MG tablet Take 1 tablet (0.25 mg total) by mouth daily.  30 tablet  3  . metoprolol tartrate (LOPRESSOR) 25 MG tablet Take 0.5 tablets (12.5 mg total) by mouth daily.  15 tablet  3  . Multiple Vitamins-Minerals (MULTIVITAMIN PO) Take 1  tablet by mouth daily.      . [DISCONTINUED] amiodarone (PACERONE) 200 MG tablet Take 1 tablet (200 mg total) by mouth 2 (two) times daily.  60 tablet  6   No facility-administered encounter medications on file as of 03/04/2013.     Review of Systems  HENT: Negative.   Eyes: Negative.   Gastrointestinal: Negative.   Musculoskeletal: Negative.   Skin: Negative.   Neurological: Negative.   Psychiatric/Behavioral: Negative.   All other systems reviewed and are negative.    BP 110/78  Pulse 66  Ht 5\' 10"  (1.778 m)  Wt 209 lb 4 oz (94.915 kg)  BMI 30.02 kg/m2  Physical Exam  Nursing note and vitals reviewed. Constitutional: He is oriented to person, place, and time. He appears well-developed and well-nourished.  HENT:  Head: Normocephalic.  Nose: Nose normal.  Mouth/Throat: Oropharynx is clear and moist.  Eyes: Conjunctivae are normal. Pupils are equal, round, and reactive to light.  Neck: Normal range of motion. Neck supple. No JVD present.  Cardiovascular: S1 normal, S2 normal, normal heart sounds and intact distal pulses.  An irregularly irregular rhythm present. Tachycardia present.  Exam reveals no gallop and no friction rub.   No murmur heard. Pulmonary/Chest: Effort normal and breath sounds normal. No respiratory distress. He has no wheezes. He has no rales. He exhibits no tenderness.  Abdominal: Soft. Bowel sounds are normal. He exhibits no distension. There is no tenderness.  Musculoskeletal: Normal range of motion. He exhibits no edema and no tenderness.  Lymphadenopathy:    He has  no cervical adenopathy.  Neurological: He is alert and oriented to person, place, and time. Coordination normal.  Skin: Skin is warm and dry. No rash noted. No erythema.  Psychiatric: He has a normal mood and affect. His behavior is normal. Judgment and thought content normal.      Assessment and Plan

## 2014-05-26 ENCOUNTER — Ambulatory Visit: Payer: Self-pay | Admitting: Family Medicine

## 2014-06-02 ENCOUNTER — Ambulatory Visit (INDEPENDENT_AMBULATORY_CARE_PROVIDER_SITE_OTHER): Payer: No Typology Code available for payment source | Admitting: Cardiovascular Disease

## 2014-06-02 ENCOUNTER — Encounter: Payer: Self-pay | Admitting: Cardiovascular Disease

## 2014-06-02 VITALS — BP 104/74 | HR 138 | Ht 69.0 in | Wt 249.5 lb

## 2014-06-02 DIAGNOSIS — E669 Obesity, unspecified: Secondary | ICD-10-CM | POA: Insufficient documentation

## 2014-06-02 DIAGNOSIS — I4891 Unspecified atrial fibrillation: Secondary | ICD-10-CM

## 2014-06-02 DIAGNOSIS — F439 Reaction to severe stress, unspecified: Secondary | ICD-10-CM

## 2014-06-02 DIAGNOSIS — Z658 Other specified problems related to psychosocial circumstances: Secondary | ICD-10-CM

## 2014-06-02 DIAGNOSIS — Z6835 Body mass index (BMI) 35.0-35.9, adult: Secondary | ICD-10-CM | POA: Insufficient documentation

## 2014-06-02 DIAGNOSIS — R0683 Snoring: Secondary | ICD-10-CM

## 2014-06-02 DIAGNOSIS — R0602 Shortness of breath: Secondary | ICD-10-CM

## 2014-06-02 DIAGNOSIS — E66811 Obesity, class 1: Secondary | ICD-10-CM | POA: Insufficient documentation

## 2014-06-02 MED ORDER — RIVAROXABAN 20 MG PO TABS: 20.0000 mg | ORAL_TABLET | Freq: Every day | ORAL | Status: DC

## 2014-06-02 MED ORDER — DIGOXIN 250 MCG PO TABS: 0.2500 mg | ORAL_TABLET | Freq: Every day | ORAL | Status: DC

## 2014-06-02 MED ORDER — METOPROLOL SUCCINATE ER 50 MG PO TB24: 50.0000 mg | ORAL_TABLET | Freq: Every day | ORAL | Status: DC

## 2014-06-02 NOTE — Progress Notes (Signed)
Patient ID: Stanley ChihuahuaWesley E Stratmann, male    DOB: [birth year], 48 y.o.   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 Developing atrial fibrillation in 2013 with cardioversion 07/06/2012, successful restoring of his normal sinus rhythm.  Previous clinic visit with recurrent atrial fibrillation started back on anticoagulation.  Suspected obstructive sleep apnea, not on CPAP . Sleep apnea noted during anesthesia of cardioversion  Significant stress and anxiety when driving his stroke in the [place] area . Wife reports he has anger issues when driving . He  previously went for routine DOT physical and this was denied as he was in atrial fibrillation and not on anticoagulation .   In followup today, he has noticed tachycardia and palpitations for the past several weeks, possibly months. He did not seek her care until today. He has had increasing shortness of breath particularly with exertion. Clearly symptomatic from his fast heart rate. Is not taking any medications and reports he was confused and thought he was supposed to stop everything He denies any significant chest pain lightheadedness or dizziness Weight continues to trend upwards  Prior  Echocardiogram showed ejection fraction 50%, mild dilatation of the left atrium, no significant valvular disease  He reports having a stress test at [facility] 6 years ago. This was reportedly normal.  EKG shows  atrial fibrillation with rate 138 beats a minute, no significant ST or T wave changes   Outpatient Encounter Prescriptions as of 06/02/2014  Medication Sig  . dabigatran (PRADAXA) 150 MG CAPS Take 1 capsule (150 mg total) by mouth every 12 (twelve) hours.  . digoxin (LANOXIN) 0.25 MG tablet Take 1 tablet (0.25 mg total) by mouth daily.  . metoprolol tartrate (LOPRESSOR) 25 MG tablet  Take 1 tablet (25 mg total) by mouth daily.  . Multiple Vitamins-Minerals (MULTIVITAMIN PO) Take 1 tablet by mouth daily.    Review of Systems  Constitutional: Negative.   HENT: Negative.   Eyes: Negative.   Respiratory: Positive for shortness of breath.   Cardiovascular: Positive for palpitations.  Gastrointestinal: Negative.   Endocrine: Negative.   Musculoskeletal: Negative.   Skin: Negative.   Allergic/Immunologic: Negative.   Neurological: Negative.   Hematological: Negative.   Psychiatric/Behavioral: Negative.   All other systems reviewed and are negative.   BP 104/74  Pulse 138  Ht 5\' 9"  (1.753 m)  Wt 249 lb 8 oz (113.172 kg)  BMI 36.83 kg/m2  Physical Exam  Nursing note and vitals reviewed. Constitutional: He is oriented to person, place, and time. He appears well-developed and well-nourished.  HENT:  Head: Normocephalic.  Nose: Nose normal.  Mouth/Throat: Oropharynx is clear and moist.  Eyes: Conjunctivae are normal. Pupils are equal, round, and reactive to light.  Neck: Normal range of motion. Neck supple. No JVD present.  Cardiovascular: S1 normal, S2 normal, normal heart sounds and intact distal pulses.  An irregularly irregular rhythm present. Tachycardia present.  Exam reveals no gallop and no friction rub.   No murmur heard. Pulmonary/Chest: Effort normal and breath sounds normal. No respiratory distress. He has no wheezes. He has no rales. He exhibits no tenderness.  Abdominal: Soft. Bowel sounds are normal. He exhibits no distension. There is no tenderness.  Musculoskeletal: Normal range of motion. He exhibits no edema and no tenderness.  Lymphadenopathy:    He has no cervical adenopathy.  Neurological: He is alert  and oriented to person, place, and time. Coordination normal.  Skin: Skin is warm and dry. No rash noted. No erythema.  Psychiatric: He has a normal mood and affect. His behavior is normal. Judgment and thought content normal.       Assessment and Plan

## 2014-06-02 NOTE — Assessment & Plan Note (Signed)
Likely has obstructive sleep apnea. Encouraged him again that he would likely benefit from a sleep study and CPAP

## 2014-06-02 NOTE — Assessment & Plan Note (Signed)
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

## 2014-06-02 NOTE — Assessment & Plan Note (Signed)
Acute onset of shortness of breath likely from atrial fibrillation with rapid rate. No clear signs of fluid retention, lungs are clear. Lasix may be needed for shortness of breath persist despite heart rate control

## 2014-06-02 NOTE — Assessment & Plan Note (Signed)
Weight continues to be a major issue. poor lifestyle, not exercising. Counseled him on watching his diet

## 2014-06-02 NOTE — Patient Instructions (Signed)
Please start metoprolol once a day and digoxin one a day for heart rate control Please start xarelto one a day for blood thinner  Please call us if you have new issues that need to be addressed before your next appt.  Your physician wants you to follow-up in: 1 months.

## 2014-06-02 NOTE — Assessment & Plan Note (Addendum)
He is back in atrial fibrillation today and has likely been having arrhythmia on a consistent basis for several weeks to month or 2. We will restart his anticoagulation with xarelto 20 mg daily. We've also restarted his metoprolol 25 mg twice a day, and digoxin 0.25 mg daily After 3 weeks or so, we could try flecainide or amiodarone in an attempt to convert him to normal sinus rhythm His obstructive sleep apnea and lack of CPAP is likely contributing to his symptoms

## 2014-06-03 ENCOUNTER — Telehealth: Payer: Self-pay

## 2014-06-03 NOTE — Telephone Encounter (Signed)
Pt states Xarelto is too expensive, would like an alternative. Please call.

## 2014-06-03 NOTE — Telephone Encounter (Signed)
With the coupon for xarelto , should be less than $10 Only other option is warfarin

## 2014-06-03 NOTE — Telephone Encounter (Signed)
Spoke w/ pt.  Advised him of Dr. Windell HummingbirdGollan's recommendation.  He is agreeable to continuing xarelto for now.  He requests a coupon and samples, if possible.  Advised him that I am leaving these at front desk to pick up at his convenience.

## 2014-06-04 ENCOUNTER — Encounter: Payer: Self-pay | Admitting: Cardiovascular Disease

## 2014-06-16 ENCOUNTER — Ambulatory Visit (INDEPENDENT_AMBULATORY_CARE_PROVIDER_SITE_OTHER): Payer: No Typology Code available for payment source | Admitting: Family Medicine

## 2014-06-16 ENCOUNTER — Ambulatory Visit (INDEPENDENT_AMBULATORY_CARE_PROVIDER_SITE_OTHER)
Admission: RE | Admit: 2014-06-16 | Discharge: 2014-06-16 | Disposition: A | Discharge status: Home / Self Care | Payer: No Typology Code available for payment source | Source: Ambulatory Visit | Attending: Family Medicine | Admitting: Family Medicine

## 2014-06-16 ENCOUNTER — Encounter: Payer: Self-pay | Admitting: Family Medicine

## 2014-06-16 ENCOUNTER — Telehealth: Payer: Self-pay

## 2014-06-16 VITALS — BP 116/84 | HR 84 | Temp 97.9°F | Ht 70.0 in | Wt 252.0 lb

## 2014-06-16 DIAGNOSIS — F439 Reaction to severe stress, unspecified: Secondary | ICD-10-CM

## 2014-06-16 DIAGNOSIS — E669 Obesity, unspecified: Secondary | ICD-10-CM

## 2014-06-16 DIAGNOSIS — I481 Persistent atrial fibrillation: Secondary | ICD-10-CM

## 2014-06-16 DIAGNOSIS — R7309 Other abnormal glucose: Secondary | ICD-10-CM

## 2014-06-16 DIAGNOSIS — R1011 Right upper quadrant pain: Secondary | ICD-10-CM

## 2014-06-16 DIAGNOSIS — M79644 Pain in right finger(s): Secondary | ICD-10-CM

## 2014-06-16 DIAGNOSIS — I4819 Other persistent atrial fibrillation: Secondary | ICD-10-CM

## 2014-06-16 DIAGNOSIS — Z658 Other specified problems related to psychosocial circumstances: Secondary | ICD-10-CM

## 2014-06-16 DIAGNOSIS — R7303 Prediabetes: Secondary | ICD-10-CM | POA: Insufficient documentation

## 2014-06-16 DIAGNOSIS — M79646 Pain in unspecified finger(s): Secondary | ICD-10-CM | POA: Insufficient documentation

## 2014-06-16 DIAGNOSIS — R1084 Generalized abdominal pain: Secondary | ICD-10-CM | POA: Insufficient documentation

## 2014-06-16 DIAGNOSIS — Z23 Encounter for immunization: Secondary | ICD-10-CM

## 2014-06-16 MED ORDER — SERTRALINE HCL 25 MG PO TABS: 25.0000 mg | ORAL_TABLET | Freq: Every day | ORAL | Status: DC

## 2014-06-16 NOTE — Assessment & Plan Note (Signed)
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  Discussed anxiolytic, however pt not interested in any med that may cause sedation. Will start daily zoloft at 25mg  daily. Discussed common side effects and adverse effects to monitor for.  Pt agrees with plan.

## 2014-06-16 NOTE — Patient Instructions (Addendum)
Flu shot today. Return tomorrow morning fasting for blood work. Pass by Marion's office to schedule abdominal ultrasound. Try zoloft 25mg  daily for anxiety. This is a daily mood medicine that can help lower anxiety levels. For joint pain - start tylenol 1000mg  twice daily. Avoid aleve or advil. Right had xray to eval for arthritis. Good to see you today, call us with quesitons. Return in 3 months for follow up, sooner if needed.

## 2014-06-16 NOTE — Assessment & Plan Note (Signed)
Suspicious for biliary colic with sxs he describes - will order abd US and CMP, lipase and CBC. If + gallstones, will refer to surgery. Pt agrees.

## 2014-06-16 NOTE — Assessment & Plan Note (Signed)
Encouraged restart exercising as soon as afib allows him.

## 2014-06-16 NOTE — Progress Notes (Signed)
BP 116/84  Pulse 84  Temp(Src) 97.9 F (36.6 C) (Oral)  Ht 5\' 10"  (1.778 m )  Wt 252 lb (114.306 kg)  BMI 36.16 kg/m2  SpO2 96%   CC: re establish care  Subjective:    Patient ID: Stanley Kane, male    DOB: [birth year], 48 y.o.   MRN: [id number]  HPI: Stanley ChihuahuaWesley E Rust is a 48 y.o. male presenting on 06/16/2014 for Establish Care   Not seen since 03/2012 when he established care at our office. [place]. Stress with driving.  Last visit here was found to have atrial fibrillation s/p evaluation by Dr. Mariah MillingGollan. S/p successful cardioversion 06/2012, but unfortunately had recurrence of atrial fibrillation. Placed on xarelto, digoxin, and metoprolol succinate 50mg  daily. He was lost to follow up from 02/2013 to 05/2014 when he returned to see Dr. Mariah MillingGollan, and in interim had self stopped all these medications for unknown amount of time.  Significant stress with driving up in [place]. Requests med for this, but unable to take prn med that will cause sedation. Stays very anxiety and stressed while driving. No SI/HI.   Also with suspected obesity and OSA not on CPAP. Pt does snore significantly.  Body mass index is 36.16 kg/(m^2).  Wt Readings from Last 3 Encounters:  06/16/14 252 lb (114.306 kg)  06/02/14 249 lb 8 oz (113.172 kg)  03/04/13 209 lb 4 oz (94.915 kg)    Over last 6-8 months noticing RUQ pain described as numb/tingling that lasts about 1 hour and occasionally radiates to LUQ. Describes crescendo/decrescendo pain. Worse with fried foods. 2 wks ago with episode of nausea no vomiting. Denies fevers/chills, diarrhea/constipation. Denies significant GERD sxs.  Joint pains of hands worse in mornings - stiffness. No redness or warmth of joints. Knuckles swell. Mother with h/o arthritis. Denies other joint pains.  Relevant past medical, surgical, family and social history reviewed and updated as indicated.  Allergies and medications reviewed and updated. Current Outpatient  Prescriptions on File Prior to Visit  Medication Sig  . digoxin (LANOXIN) 0.25 MG tablet Take 1 tablet (0.25 mg total) by mouth daily.  . metoprolol succinate (TOPROL XL) 50 MG 24 hr tablet Take 1 tablet (50 mg total) by mouth daily.  . rivaroxaban (XARELTO) 20 MG TABS tablet Take 1 tablet (20 mg total) by mouth daily with supper.   No current facility-administered medications on file prior to visit.    Review of Systems Per HPI unless specifically indicated above    Objective:    BP 116/84  Pulse 84  Temp(Src) 97.9 F (36.6 C) (Oral)  Ht 5\' 10"  (1.778 m)  Wt 252 lb (114.306 kg)  BMI 36.16 kg/m2  SpO2 96%  Physical Exam  Nursing note and vitals reviewed. Constitutional: He appears well-developed and well-nourished. No distress.  HENT:  Mouth/Throat: Oropharynx is clear and moist. No oropharyngeal exudate.  Eyes: Conjunctivae and EOM are normal. Pupils are equal, round, and reactive to light.  Cardiovascular: Normal rate, normal heart sounds and intact distal pulses.  An irregularly irregular rhythm present.  No murmur heard. Pulmonary/Chest: Effort normal and breath sounds normal. No respiratory distress. He has no wheezes. He has no rales.  Abdominal: Soft. Normal appearance and bowel sounds are normal. He exhibits no distension and no mass. There is no hepatosplenomegaly. There is tenderness in the right upper quadrant. There is no rigidity, no rebound, no guarding, no CVA tenderness and negative Murphy's sign. No hernia. Hernia confirmed negative in the ventral  area.  obese  Musculoskeletal: He exhibits no edema.  No active synovitis. 2+ rad pulses bilaterally Point tender to palpation of R palmar 1st MCP joint - small nodule also palpable. Also tender with flexion/extension at this joint. Able to flex/extend at IP of thumb without pain.  No significant pain to palpation other joints of R hand.  Skin: Skin is warm and dry. No rash noted.  Psychiatric: He has a normal mood  and affect.       Assessment & Plan:   Problem List Items Addressed This Visit   Thumb pain     Tender at palmar 1st MCP joint. ?tenosynovitis vs arthritis. Check xray of R hand today to eval for arthritic burden.  Depending on results of xray - consider autoimmune eval including RF/CCP. Consider referral to rheum vs hand.    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  Discussed anxiolytic, however pt not interested in any med that may cause sedation. Will start daily zoloft at 25mg  daily. Discussed common side effects and adverse effects to monitor for.  Pt agrees with plan.    RUQ abdominal pain - Primary     Suspicious for biliary colic with sxs he describes - will order abd US and CMP, lipase and CBC. If + gallstones, will refer to surgery. Pt agrees.    Relevant Orders      Lipase      Comprehensive metabolic panel      CBC with Differential      US Abdomen Complete   Prediabetes     Last A1c 5.8% (2013). Recheck today. Anticipate deterioration given marked weight gain noted.    Relevant Orders      Hemoglobin A1c   Obesity     Encouraged restart exercising as soon as afib allows him.    Relevant Orders      Lipid panel   Atrial fibrillation     Persistent afib, but rate controlled with toprol xl and reports compliance with digoxin and xarelto. Continue regimen, has f/u planned with cards.        Follow up plan: Return in about 3 months (around 09/16/2014), or as needed.

## 2014-06-16 NOTE — Assessment & Plan Note (Addendum)
Tender at palmar 1st MCP joint. ?tenosynovitis vs arthritis. Check xray of R hand today to eval for arthritic burden.  Depending on results of xray - consider autoimmune eval including RF/CCP. Consider referral to rheum vs hand.

## 2014-06-16 NOTE — Assessment & Plan Note (Signed)
Last A1c 5.8% (2013). Recheck today. Anticipate deterioration given marked weight gain noted.

## 2014-06-16 NOTE — Telephone Encounter (Signed)
Placed samples of Xarelto 20 mg @ front desk for pick up. 

## 2014-06-16 NOTE — Telephone Encounter (Signed)
Pt would like Xarelto samples.  

## 2014-06-16 NOTE — Progress Notes (Signed)
Pre visit review using our clinic review tool, if applicable. No additional management support is needed unless otherwise documented below in the visit note. 

## 2014-06-16 NOTE — Addendum Note (Signed)
Addended by: Josph MachoANCE, KIMBERLY A on: 06/16/2014 11:50 AM   Modules accepted: Orders

## 2014-06-16 NOTE — Assessment & Plan Note (Signed)
Persistent afib, but rate controlled with toprol xl and reports compliance with digoxin and xarelto. Continue regimen, has f/u planned with cards.

## 2014-06-17 ENCOUNTER — Other Ambulatory Visit (INDEPENDENT_AMBULATORY_CARE_PROVIDER_SITE_OTHER): Payer: No Typology Code available for payment source

## 2014-06-17 DIAGNOSIS — R1011 Right upper quadrant pain: Secondary | ICD-10-CM

## 2014-06-17 DIAGNOSIS — R7309 Other abnormal glucose: Secondary | ICD-10-CM

## 2014-06-17 DIAGNOSIS — E669 Obesity, unspecified: Secondary | ICD-10-CM

## 2014-06-17 DIAGNOSIS — R7303 Prediabetes: Secondary | ICD-10-CM

## 2014-06-17 LAB — CBC WITH DIFFERENTIAL/PLATELET
BASOS PCT: 0.7 % (ref 0.0–3.0)
Basophils Absolute: 0 10*3/uL (ref 0.0–0.1)
EOS PCT: 4.4 % (ref 0.0–5.0)
Eosinophils Absolute: 0.3 10*3/uL (ref 0.0–0.7)
HEMATOCRIT: 46.9 % (ref 39.0–52.0)
HEMOGLOBIN: 15.6 g/dL (ref 13.0–17.0)
Lymphocytes Relative: 27.6 % (ref 12.0–46.0)
Lymphs Abs: 1.9 10*3/uL (ref 0.7–4.0)
MCHC: 33.4 g/dL (ref 30.0–36.0)
MCV: 94.7 fl (ref 78.0–100.0)
Monocytes Absolute: 0.8 10*3/uL (ref 0.1–1.0)
Monocytes Relative: 11.1 % (ref 3.0–12.0)
NEUTROS PCT: 56.2 % (ref 43.0–77.0)
Neutro Abs: 3.9 10*3/uL (ref 1.4–7.7)
Platelets: 334 10*3/uL (ref 150.0–400.0)
RBC: 4.95 Mil/uL (ref 4.22–5.81)
RDW: 13.7 % (ref 11.5–15.5)
WBC: 6.9 10*3/uL (ref 4.0–10.5)

## 2014-06-17 LAB — LIPID PANEL
Cholesterol: 160 mg/dL (ref 0–200)
HDL: 36 mg/dL — ABNORMAL LOW (ref >39.00)
LDL CALC: 103 mg/dL — AB (ref 0–99)
NONHDL: 124
TRIGLYCERIDES: 106 mg/dL (ref 0.0–149.0)
Total CHOL/HDL Ratio: 4
VLDL: 21.2 mg/dL (ref 0.0–40.0)

## 2014-06-17 LAB — COMPREHENSIVE METABOLIC PANEL
ALT: 31 U/L (ref 0–53)
AST: 27 U/L (ref 0–37)
Albumin: 3.4 g/dL — ABNORMAL LOW (ref 3.5–5.2)
Alkaline Phosphatase: 41 U/L (ref 39–117)
BUN: 15 mg/dL (ref 6–23)
CALCIUM: 9.1 mg/dL (ref 8.4–10.5)
CHLORIDE: 104 meq/L (ref 96–112)
CO2: 30 meq/L (ref 19–32)
CREATININE: 1 mg/dL (ref 0.4–1.5)
GFR: 82.03 mL/min (ref >60.00)
GLUCOSE: 116 mg/dL — AB (ref 70–99)
Potassium: 4.4 mEq/L (ref 3.5–5.1)
Sodium: 139 mEq/L (ref 135–145)
Total Bilirubin: 1.1 mg/dL (ref 0.2–1.2)
Total Protein: 7.6 g/dL (ref 6.0–8.3)

## 2014-06-17 LAB — HEMOGLOBIN A1C: Hgb A1c MFr Bld: 5.9 % (ref 4.6–6.5)

## 2014-06-17 LAB — LIPASE: LIPASE: 26 U/L (ref 11.0–59.0)

## 2014-06-18 ENCOUNTER — Encounter: Payer: Self-pay | Admitting: *Deleted

## 2014-06-18 ENCOUNTER — Encounter: Payer: Self-pay | Admitting: Family Medicine

## 2014-06-29 DIAGNOSIS — K760 Fatty (change of) liver, not elsewhere classified: Secondary | ICD-10-CM

## 2014-06-29 HISTORY — DX: Fatty (change of) liver, not elsewhere classified: K76.0

## 2014-07-07 ENCOUNTER — Encounter: Payer: Self-pay | Admitting: Family Medicine

## 2014-07-07 ENCOUNTER — Ambulatory Visit (INDEPENDENT_AMBULATORY_CARE_PROVIDER_SITE_OTHER): Payer: No Typology Code available for payment source | Admitting: Cardiovascular Disease

## 2014-07-07 ENCOUNTER — Encounter: Payer: Self-pay | Admitting: Cardiovascular Disease

## 2014-07-07 ENCOUNTER — Ambulatory Visit: Payer: Self-pay | Admitting: Family Medicine

## 2014-07-07 ENCOUNTER — Telehealth: Payer: Self-pay | Admitting: Family Medicine

## 2014-07-07 VITALS — BP 100/72 | HR 101 | Ht 70.0 in | Wt 248.2 lb

## 2014-07-07 DIAGNOSIS — R7309 Other abnormal glucose: Secondary | ICD-10-CM

## 2014-07-07 DIAGNOSIS — Z658 Other specified problems related to psychosocial circumstances: Secondary | ICD-10-CM

## 2014-07-07 DIAGNOSIS — F439 Reaction to severe stress, unspecified: Secondary | ICD-10-CM

## 2014-07-07 DIAGNOSIS — I4891 Unspecified atrial fibrillation: Secondary | ICD-10-CM

## 2014-07-07 DIAGNOSIS — R0683 Snoring: Secondary | ICD-10-CM

## 2014-07-07 DIAGNOSIS — R0602 Shortness of breath: Secondary | ICD-10-CM

## 2014-07-07 DIAGNOSIS — R7303 Prediabetes: Secondary | ICD-10-CM

## 2014-07-07 DIAGNOSIS — R1084 Generalized abdominal pain: Secondary | ICD-10-CM

## 2014-07-07 MED ORDER — FLECAINIDE ACETATE 100 MG PO TABS: 100.0000 mg | ORAL_TABLET | Freq: Two times a day (BID) | ORAL | Status: DC

## 2014-07-07 MED ORDER — RIVAROXABAN 20 MG PO TABS: 20.0000 mg | ORAL_TABLET | Freq: Every day | ORAL | Status: DC

## 2014-07-07 NOTE — Telephone Encounter (Signed)
Request to change dx for abd US done.

## 2014-07-07 NOTE — Assessment & Plan Note (Signed)
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

## 2014-07-07 NOTE — Assessment & Plan Note (Signed)
Chronic mild shortness of breath, improved with better heart rate control. Relatively asymptomatic. We'll try to restore normal sinus rhythm with medications, followed by cardioversion.

## 2014-07-07 NOTE — Progress Notes (Signed)
Patient ID: Stanley ChihuahuaWesley E Beckles, male    DOB: 02/15/1966, 48 y.o.   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 Developing atrial fibrillation in 2013 with cardioversion 07/06/2012, successful restoring of his normal sinus rhythm.  Previous clinic visit with recurrent atrial fibrillation started back on anticoagulation.  Suspected obstructive sleep apnea, not on CPAP . Sleep apnea noted during anesthesia of cardioversion  On his last clinic visit 1 month ago, he was found to be back in atrial fibrillation. He has been relatively asymptomatic. He was started back on digoxin, metoprolol, blood thinners. In follow-up today, he has no shortness of breath with exertion, no lower extremity edema. He does feel that his heart rate has improved, less palpitations He continues to drive a truck. He is interested in trying to get back to normal sinus rhythm. EKG today shows atrial fibrillation with ventricular rate 101 bpm, no significant ST or T-wave changes   Other past medical history Significant stress and anxiety when driving [place] stroke in the [place] . Wife reports he has anger issues when driving . He  previously went for routine DOT physical and this was denied as he was in atrial fibrillation and not on anticoagulation .   Prior  Echocardiogram showed ejection fraction 50%, mild dilatation of the left atrium, no significant valvular disease  He reports having a stress test at [facility] 6 years ago. This was reportedly normal.   Outpatient Encounter Prescriptions as of 07/07/2014  Medication Sig  . digoxin (LANOXIN) 0.25 MG tablet Take 1 tablet (0.25 mg total) by mouth daily.  . metoprolol succinate (TOPROL XL) 50 MG 24 hr tablet Take 1 tablet (50 mg total) by mouth daily.  . rivaroxaban (XARELTO) 20 MG TABS tablet Take 1  tablet (20 mg total) by mouth daily with supper.  . sertraline (ZOLOFT) 25 MG tablet Take 1 tablet (25 mg total) by mouth daily.   In terms of his social history  reports that he has never smoked. He has never used smokeless tobacco. He reports that he drinks about 1.2 oz of alcohol per week. He reports that he does not use illicit drugs.  Review of Systems  Constitutional: Negative.   HENT: Negative.   Eyes: Negative.   Cardiovascular: Positive for palpitations.  Gastrointestinal: Negative.   Musculoskeletal: Negative.   Skin: Negative.   Neurological: Negative.   Hematological: Negative.   All other systems reviewed and are negative.   BP 100/72 mmHg  Pulse 101  Ht 5\' 10"  (1.778 m)  Wt 248 lb 4 oz (112.605 kg)  BMI 35.62 kg/m2  Physical Exam  Constitutional: He is oriented to person, place, and time. He appears well-developed and well-nourished.  obese  HENT:  Head: Normocephalic.  Nose: Nose normal.  Mouth/Throat: Oropharynx is clear and moist.  Eyes: Conjunctivae are normal. Pupils are equal, round, and reactive to light.  Neck: Normal range of motion. Neck supple. No JVD present.  Cardiovascular: Normal rate, regular rhythm, S1 normal, S2 normal, normal heart sounds and intact distal pulses.  An irregularly irregular rhythm present. Tachycardia present.  Exam reveals no gallop and no friction rub.   No murmur heard. Pulmonary/Chest: Effort normal and breath sounds normal. No respiratory distress. He has no wheezes. He has no rales. He exhibits no tenderness.  Abdominal: Soft. Bowel sounds are normal. He exhibits no  distension. There is no tenderness.  Musculoskeletal: Normal range of motion. He exhibits no edema or tenderness.  Lymphadenopathy:    He has no cervical adenopathy.  Neurological: He is alert and oriented to person, place, and time. Coordination normal.  Skin: Skin is warm and dry. No rash noted. No erythema.  Psychiatric: He has a normal mood and affect.  His behavior is normal. Judgment and thought content normal.      Assessment and Plan   Nursing note and vitals reviewed.

## 2014-07-07 NOTE — Assessment & Plan Note (Signed)
I suspect he has underlying sleep apnea, likely contributing to recurrent episodes of atrial fibrillation. In the past he has been reluctant to have a sleep study

## 2014-07-07 NOTE — Assessment & Plan Note (Signed)
We have encouraged continued exercise, careful diet management in an effort to lose weight. 

## 2014-07-07 NOTE — Assessment & Plan Note (Addendum)
Various treatment options discussed with him including several medications, possible cardioversion.We have recommended that he start flecainide 100 mg twice a day in addition to his current medications. Repeat EKG in 2 weeks time. If he remains in atrial fibrillation, we will schedule a cardioversion.

## 2014-07-07 NOTE — Patient Instructions (Addendum)
You are doing well. Please start flecainide one pill twice a day  EKG in 2 weeks or so to check the rhythm If still in atrial fibrillation, we can schedule a cardioversion   Please call us if you have new issues that need to be addressed before your next appt.  Your physician wants you to follow-up in: 1 month.   Your next appointment will be scheduled in our new office located at :  [facility]  [address], [address]  [place], [place] [id number]

## 2014-07-08 ENCOUNTER — Encounter: Payer: Self-pay | Admitting: *Deleted

## 2014-07-28 ENCOUNTER — Ambulatory Visit (INDEPENDENT_AMBULATORY_CARE_PROVIDER_SITE_OTHER): Payer: No Typology Code available for payment source

## 2014-07-28 VITALS — BP 128/78 | Ht 69.0 in | Wt 248.5 lb

## 2014-07-28 DIAGNOSIS — I4819 Other persistent atrial fibrillation: Secondary | ICD-10-CM

## 2014-07-28 DIAGNOSIS — I481 Persistent atrial fibrillation: Secondary | ICD-10-CM

## 2014-07-28 NOTE — Patient Instructions (Addendum)
Your physician has recommended that you have a Cardioversion (DCCV). Electrical Cardioversion uses a jolt of electricity to your heart either through paddles or wired patches attached to your chest. This is a controlled, usually prescheduled, procedure. Defibrillation is done under light anesthesia in the hospital, and you usually go home the day of the procedure. This is done to get your heart back into a normal rhythm. You are not awake for the procedure. Please see the instruction sheet given to you today.  We will draw labs today:  CBC, BMET, PT/INR

## 2014-07-28 NOTE — Addendum Note (Signed)
Addended by: Rhea BeltonMOODY, AMANDA R on: 07/28/2014 03:09 PM   Modules accepted: Orders, Level of Service

## 2014-07-28 NOTE — Progress Notes (Signed)
1.) Reason for visit: EKG  2.) Name of MD requesting visit: Dr. Mariah MillingGollan  3.) H&P: Pt presents to the office for 2 week EKG to determine if he is still in afib.  4.) ROS related to problem: Pt's EKG positive for afib.  5.) Assessment and plan per MD: Cardioversion sched for 08/04/14 @ 7:30am.  Preprocedure labs drawn today.

## 2014-07-29 LAB — CBC WITH DIFFERENTIAL
BASOS: 1 %
Basophils Absolute: 0.1 10*3/uL (ref 0.0–0.2)
EOS ABS: 0.3 10*3/uL (ref 0.0–0.4)
Eos: 4 %
HCT: 47 % (ref 37.5–51.0)
HEMOGLOBIN: 16 g/dL (ref 12.6–17.7)
IMMATURE GRANS (ABS): 0 10*3/uL (ref 0.0–0.1)
Immature Granulocytes: 0 %
LYMPHS: 34 %
Lymphocytes Absolute: 2.7 10*3/uL (ref 0.7–3.1)
MCH: 32.1 pg (ref 26.6–33.0)
MCHC: 34 g/dL (ref 31.5–35.7)
MCV: 94 fL (ref 79–97)
MONOCYTES: 11 %
Monocytes Absolute: 0.8 10*3/uL (ref 0.1–0.9)
NEUTROS PCT: 50 %
Neutrophils Absolute: 3.9 10*3/uL (ref 1.4–7.0)
Platelets: 341 10*3/uL (ref 150–379)
RBC: 4.98 x10E6/uL (ref 4.14–5.80)
RDW: 14.4 % (ref 12.3–15.4)
WBC: 7.8 10*3/uL (ref 3.4–10.8)

## 2014-07-29 LAB — BASIC METABOLIC PANEL
BUN/Creatinine Ratio: 14 (ref 9–20)
BUN: 15 mg/dL (ref 6–24)
CALCIUM: 9.6 mg/dL (ref 8.7–10.2)
CO2: 22 mmol/L (ref 18–29)
Chloride: 100 mmol/L (ref 97–108)
Creatinine, Ser: 1.09 mg/dL (ref 0.76–1.27)
GFR calc Af Amer: 93 mL/min/{1.73_m2} (ref >59)
GFR calc non Af Amer: 80 mL/min/{1.73_m2} (ref >59)
Glucose: 93 mg/dL (ref 65–99)
POTASSIUM: 5.4 mmol/L — AB (ref 3.5–5.2)
Sodium: 139 mmol/L (ref 134–144)

## 2014-07-29 LAB — PROTIME-INR
INR: 1.1 (ref 0.8–1.2)
Prothrombin Time: 11.5 s (ref 9.1–12.0)

## 2014-08-04 ENCOUNTER — Ambulatory Visit: Payer: Self-pay | Admitting: Cardiovascular Disease

## 2014-08-05 ENCOUNTER — Telehealth: Payer: Self-pay | Admitting: Cardiovascular Disease

## 2014-08-05 NOTE — Telephone Encounter (Signed)
Can we scan his EKG from [facility] to our records We need his heart rate and blood pressure Would continue flecainide and metoprool. And xarelto for now

## 2014-08-05 NOTE — Telephone Encounter (Signed)
Pt would like to know if he should continue flecainide 100 mg BID, as he was NSR and was not cardioverted. Please advise.  Thank you.

## 2014-08-05 NOTE — Telephone Encounter (Signed)
Pt is asking about med, he was suppose to get a shock to put heart back into rhythm, but when dr.g went to do it. EKG was in rhythm thus patient is asking should he still take them as directed or should they be changed please call patient.

## 2014-08-06 NOTE — Telephone Encounter (Signed)
Spoke w/ pt's wife.  Advised her of Dr. Gollan's recommendation. She verbalizes understanding and will call back w/ any questions or concerns.  

## 2014-08-29 DIAGNOSIS — G454 Transient global amnesia: Secondary | ICD-10-CM

## 2014-08-29 HISTORY — DX: Transient global amnesia: G45.4

## 2014-08-29 HISTORY — PX: MRI: SHX5353

## 2014-08-29 HISTORY — PX: US ECHOCARDIOGRAPHY: HXRAD669

## 2014-08-29 HISTORY — PX: OTHER SURGICAL HISTORY: SHX169

## 2014-09-01 ENCOUNTER — Encounter: Payer: Self-pay | Admitting: Cardiovascular Disease

## 2014-09-01 ENCOUNTER — Ambulatory Visit (INDEPENDENT_AMBULATORY_CARE_PROVIDER_SITE_OTHER): Payer: No Typology Code available for payment source | Admitting: Cardiovascular Disease

## 2014-09-01 VITALS — BP 120/70 | HR 97 | Ht 69.0 in | Wt 249.5 lb

## 2014-09-01 DIAGNOSIS — E669 Obesity, unspecified: Secondary | ICD-10-CM

## 2014-09-01 DIAGNOSIS — F439 Reaction to severe stress, unspecified: Secondary | ICD-10-CM

## 2014-09-01 DIAGNOSIS — R7303 Prediabetes: Secondary | ICD-10-CM

## 2014-09-01 DIAGNOSIS — R0602 Shortness of breath: Secondary | ICD-10-CM

## 2014-09-01 DIAGNOSIS — R7309 Other abnormal glucose: Secondary | ICD-10-CM

## 2014-09-01 DIAGNOSIS — Z658 Other specified problems related to psychosocial circumstances: Secondary | ICD-10-CM

## 2014-09-01 DIAGNOSIS — I4891 Unspecified atrial fibrillation: Secondary | ICD-10-CM

## 2014-09-01 MED ORDER — RIVAROXABAN 20 MG PO TABS: 20.0000 mg | ORAL_TABLET | Freq: Every day | ORAL | Status: DC

## 2014-09-01 MED ORDER — METOPROLOL SUCCINATE ER 50 MG PO TB24: 50.0000 mg | ORAL_TABLET | Freq: Every day | ORAL | Status: DC

## 2014-09-01 MED ORDER — FLECAINIDE ACETATE 100 MG PO TABS: 100.0000 mg | ORAL_TABLET | Freq: Two times a day (BID) | ORAL | Status: DC

## 2014-09-01 MED ORDER — DIGOXIN 250 MCG PO TABS: 0.2500 mg | ORAL_TABLET | Freq: Every day | ORAL | Status: DC

## 2014-09-01 NOTE — Assessment & Plan Note (Signed)
Paroxysmal atrial fibrillation. I recommended that he restart his medications as he was previously taking including flecainide and anticoagulation.

## 2014-09-01 NOTE — Patient Instructions (Signed)
Please restart your meds. No medication changes were made.  Please call us if you have new issues that need to be addressed before your next appt.  Your physician wants you to follow-up in: 6 months.  You will receive a reminder letter in the mail two months in advance. If you don't receive a letter, please call our office to schedule the follow-up appointment.

## 2014-09-01 NOTE — Assessment & Plan Note (Signed)
Recent shortness of breath likely secondary to recurrent atrial fibrillation. Recommended medication compliance in an effort to maintain normal sinus rhythm

## 2014-09-01 NOTE — Progress Notes (Signed)
Patient ID: Stanley Kane, male    DOB: 08/14/1966, 49 y.o.   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  Developing atrial fibrillation in 2013 with cardioversion 07/06/2012, successful restoring of his normal sinus rhythm.  Previous clinic visit with recurrent atrial fibrillation started back on anticoagulation, after several weeks started on flecainide Suspected obstructive sleep apnea, not on CPAP . Sleep apnea noted during anesthesia of previous cardioversion  He was scheduled for cardioversion December 2015 and on arrival was in normal sinus rhythm. Prior to that had been started on flecainide. Cardioversion was canceled. In follow-up today, he has not been taking his medications for several weeks including anticoagulation and flecainide. Reason is unclear He does report some shortness of breath with exertion. 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 Wife reports he has anger issues when driving . He  previously went for routine DOT physical and this was denied as he was in atrial fibrillation and not on anticoagulation .   Prior  Echocardiogram showed ejection fraction 50%, mild dilatation of the left atrium, no significant valvular disease  He reports having a stress test at [facility] 6 years ago. This was reportedly normal.  Allergies  Allergen Reactions  . Vicodin [Hydrocodone-Acetaminophen]     Makes him "jittery"    Outpatient Encounter Prescriptions as of 09/01/2014  Medication Sig  . digoxin (LANOXIN) 0.25 MG tablet Take 1  tablet (0.25 mg total) by mouth daily.  . metoprolol succinate (TOPROL-XL) 50 MG 24 hr tablet Take 1 tablet (50 mg total) by mouth daily.  . [DISCONTINUED] digoxin (LANOXIN) 0.25 MG tablet Take 1 tablet (0.25 mg total) by mouth daily.  . [DISCONTINUED] metoprolol succinate (TOPROL XL) 50 MG 24 hr tablet Take 1 tablet (50 mg total) by mouth daily.  . flecainide (TAMBOCOR) 100 MG tablet Take 1 tablet (100 mg total) by mouth 2 (two) times daily.  . rivaroxaban (XARELTO) 20 MG TABS tablet Take 1 tablet (20 mg total) by mouth daily with supper.  . sertraline (ZOLOFT) 25 MG tablet Take 1 tablet (25 mg total) by mouth daily. (Patient not taking: Reported on 09/01/2014)  . [DISCONTINUED] flecainide (TAMBOCOR) 100 MG tablet Take 1 tablet (100 mg total) by mouth 2 (two) times daily. (Patient not taking: Reported on 09/01/2014)  . [DISCONTINUED] rivaroxaban (XARELTO) 20 MG TABS tablet Take 1 tablet (20 mg total) by mouth daily with supper. (Patient not taking: Reported on 09/01/2014)    Past Medical History  Diagnosis Date  . Arrhythmia     "skips a beat occasionally"  . Obesity   . Heart murmur   . Atrial fibrillation   . Prediabetes   . Fatty liver 06/2014    by abd [place]    Past Surgical History  Procedure Laterality Date  . Tonsillectomy and adenoidectomy  childhood  . Cardiovascular stress test  ~2005    thinks at [facility], told normal  . Colonoscopy  2000    WNL, for weight loss after started gym    Social History  reports that he has never smoked. He has  never used smokeless tobacco. He reports that he drinks about 1.2 oz of alcohol per week. He reports that he does not use illicit drugs.  Family History family history includes Cancer in his father; Coronary artery disease (age of onset: 49) in his father; Diabetes in his mother; Heart attack in his father; Heart disease in his mother; Hypertension in his father. There is no history of Stroke.   Review of Systems  Constitutional: Negative.    Respiratory: Positive for shortness of breath.   Cardiovascular: Positive for palpitations.  Gastrointestinal: Negative.   Musculoskeletal: Negative.   Neurological: Negative.   Hematological: Negative.   All other systems reviewed and are negative.   BP 120/70 mmHg  Pulse 97  Ht  (1.753 m)  Wt 249 lb 8 oz (113.172 kg)  BMI 36.83 kg/m2  Physical Exam  Constitutional: He is oriented to person, place, and time. He appears well-developed and well-nourished.  obese  HENT:  Head: Normocephalic.  Nose: Nose normal.  Mouth/Throat: Oropharynx is clear and moist.  Eyes: Conjunctivae are normal. Pupils are equal, round, and reactive to light.  Neck: Normal range of motion. Neck supple. No JVD present.  Cardiovascular: S1 normal, S2 normal, normal heart sounds and intact distal pulses.  An irregularly irregular rhythm present. Tachycardia present.  Exam reveals no gallop and no friction rub.   No murmur heard. Pulmonary/Chest: Effort normal and breath sounds normal. No respiratory distress. He has no wheezes. He has no rales. He exhibits no tenderness.  Abdominal: Soft. Bowel sounds are normal. He exhibits no distension. There is no tenderness.  Musculoskeletal: Normal range of motion. He exhibits no edema or tenderness.  Lymphadenopathy:    He has no cervical adenopathy.  Neurological: He is alert and oriented to person, place, and time. Coordination normal.  Skin: Skin is warm and dry. No rash noted. No erythema.  Psychiatric: He has a normal mood and affect. His behavior is normal. Judgment and thought content normal.      Assessment and Plan   Nursing note and vitals reviewed.

## 2014-09-01 NOTE — Assessment & Plan Note (Signed)
We have encouraged continued exercise, careful diet management in an effort to lose weight. 

## 2014-09-01 NOTE — Assessment & Plan Note (Signed)
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 Needs stress reduction techniques, regular exercise program

## 2014-09-01 NOTE — Assessment & Plan Note (Signed)
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 Significant dietary noncompliance

## 2014-09-15 ENCOUNTER — Ambulatory Visit: Payer: Self-pay | Admitting: Family Medicine

## 2014-09-15 ENCOUNTER — Telehealth: Payer: Self-pay

## 2014-09-15 NOTE — Telephone Encounter (Signed)
Pt called, states Xarelto is too expensive. Please advise of another medication

## 2014-09-18 NOTE — Telephone Encounter (Signed)
Patient did not get a call back from 01-18.  Patient says xarelto os working for him other than costing $400 at the pharmacy.  Patient would like advice as to what to switch to that is cheaper or can he get samples.  Please call patient.

## 2014-09-18 NOTE — Telephone Encounter (Signed)
If he has Nurse, learning disabilitycommercial insurance, we should be able to get a coupon card for xarelto to cut the cost down to less than $10  Alternatively could try sarvaysa 60 mg daily with coupon card, costing $4

## 2014-09-22 ENCOUNTER — Other Ambulatory Visit: Payer: Self-pay

## 2014-09-22 MED ORDER — RIVAROXABAN 20 MG PO TABS: 20.0000 mg | ORAL_TABLET | Freq: Every day | ORAL | Status: DC

## 2014-09-22 NOTE — Telephone Encounter (Signed)
Spoke w/ pt.  He would like to continue Xarelto, as he is in the process of trying to switch ins in hopes that this will be covered.  Advised him that I am leaving samples and a coupon at the front desk to pick up at his convenience.

## 2014-09-25 ENCOUNTER — Observation Stay: Payer: Self-pay | Admitting: Emergency Medicine

## 2014-09-25 LAB — COMPREHENSIVE METABOLIC PANEL
ANION GAP: 6 — AB (ref 7–16)
AST: 27 U/L (ref 15–37)
Albumin: 3.2 g/dL — ABNORMAL LOW (ref 3.4–5.0)
Alkaline Phosphatase: 55 U/L (ref 46–116)
BUN: 15 mg/dL (ref 7–18)
Bilirubin,Total: 0.2 mg/dL (ref 0.2–1.0)
CALCIUM: 8.6 mg/dL (ref 8.5–10.1)
Chloride: 106 mmol/L (ref 98–107)
Co2: 26 mmol/L (ref 21–32)
Creatinine: 0.99 mg/dL (ref 0.60–1.30)
EGFR (African American): >60
EGFR (Non-African Amer.): >60
GLUCOSE: 123 mg/dL — AB (ref 65–99)
Osmolality: 278 (ref 275–301)
POTASSIUM: 4 mmol/L (ref 3.5–5.1)
SGPT (ALT): 31 U/L (ref 14–63)
Sodium: 138 mmol/L (ref 136–145)
TOTAL PROTEIN: 7.4 g/dL (ref 6.4–8.2)

## 2014-09-25 LAB — DIGOXIN LEVEL: DIGOXIN: 0.85 ng/mL

## 2014-09-25 LAB — HEPATIC FUNCTION PANEL
ALK PHOS: 55 U/L (ref 25–125)
ALT: 31 U/L (ref 10–40)
AST: 27 U/L (ref 14–40)
Bilirubin, Total: 0.2 mg/dL

## 2014-09-25 LAB — URINALYSIS, COMPLETE
Bacteria: NONE SEEN
Bilirubin,UR: NEGATIVE
Blood: NEGATIVE
Glucose,UR: NEGATIVE mg/dL (ref 0–75)
Ketone: NEGATIVE
LEUKOCYTE ESTERASE: NEGATIVE
Nitrite: NEGATIVE
PROTEIN: NEGATIVE
Ph: 6 (ref 4.5–8.0)
SQUAMOUS EPITHELIAL: NONE SEEN
Specific Gravity: 1.02 (ref 1.003–1.030)
WBC UR: <1 /HPF (ref 0–5)

## 2014-09-25 LAB — CBC
HCT: 42.9 % (ref 40.0–52.0)
HGB: 14.5 g/dL (ref 13.0–18.0)
MCH: 31.9 pg (ref 26.0–34.0)
MCHC: 33.7 g/dL (ref 32.0–36.0)
MCV: 95 fL (ref 80–100)
Platelet: 336 10*3/uL (ref 150–440)
RBC: 4.53 10*6/uL (ref 4.40–5.90)
RDW: 13.1 % (ref 11.5–14.5)
WBC: 10.2 10*3/uL (ref 3.8–10.6)

## 2014-09-25 LAB — LIPID PANEL
CHOLESTEROL: 157 mg/dL (ref 0–200)
Cholesterol: 157 mg/dL (ref 0–200)
HDL: 33 mg/dL — AB (ref 35–70)
HDL: 33 mg/dL — AB (ref 40–60)
LDL Cholesterol: 74 mg/dL
Ldl Cholesterol, Calc: 74 mg/dL (ref 0–100)
Triglycerides: 250 mg/dL — AB (ref 40–160)
Triglycerides: 250 mg/dL — ABNORMAL HIGH (ref 0–200)
VLDL CHOLESTEROL, CALC: 50 mg/dL — AB (ref 5–40)

## 2014-09-25 LAB — BASIC METABOLIC PANEL
Creatinine: 1 mg/dL (ref <=1.3)
GLUCOSE: 123 mg/dL
Potassium: 4 mmol/L (ref 3.4–5.3)
Sodium: 138 mmol/L (ref 137–147)

## 2014-09-25 LAB — CBC AND DIFFERENTIAL
Hemoglobin: 14.5 g/dL (ref 13.5–17.5)
Platelets: 336 10*3/uL (ref 150–399)
WBC: 10.2 10^3/mL

## 2014-09-25 LAB — PROTIME-INR
INR: 1.8
Prothrombin Time: 20.3 secs — ABNORMAL HIGH (ref 11.5–14.7)

## 2014-09-25 LAB — HEMOGLOBIN A1C: Hgb A1c MFr Bld: 5.7 % (ref 4.0–6.0)

## 2014-09-25 LAB — TROPONIN I

## 2014-09-25 LAB — ETHANOL: Ethanol: <3 mg/dL

## 2014-09-26 DIAGNOSIS — I340 Nonrheumatic mitral (valve) insufficiency: Secondary | ICD-10-CM

## 2014-09-26 LAB — HEMOGLOBIN A1C: HEMOGLOBIN A1C: 5.7 % (ref 4.2–6.3)

## 2014-09-29 ENCOUNTER — Encounter: Payer: Self-pay | Admitting: Family Medicine

## 2014-09-30 ENCOUNTER — Ambulatory Visit (INDEPENDENT_AMBULATORY_CARE_PROVIDER_SITE_OTHER): Payer: No Typology Code available for payment source | Admitting: Cardiovascular Disease

## 2014-09-30 ENCOUNTER — Encounter: Payer: Self-pay | Admitting: Cardiovascular Disease

## 2014-09-30 VITALS — BP 110/72 | HR 106 | Ht 70.0 in | Wt 250.5 lb

## 2014-09-30 DIAGNOSIS — F439 Reaction to severe stress, unspecified: Secondary | ICD-10-CM

## 2014-09-30 DIAGNOSIS — Z658 Other specified problems related to psychosocial circumstances: Secondary | ICD-10-CM

## 2014-09-30 DIAGNOSIS — E669 Obesity, unspecified: Secondary | ICD-10-CM

## 2014-09-30 DIAGNOSIS — I4891 Unspecified atrial fibrillation: Secondary | ICD-10-CM

## 2014-09-30 DIAGNOSIS — R0602 Shortness of breath: Secondary | ICD-10-CM

## 2014-09-30 MED ORDER — FLECAINIDE ACETATE 150 MG PO TABS: 150.0000 mg | ORAL_TABLET | Freq: Two times a day (BID) | ORAL | Status: DC

## 2014-09-30 MED ORDER — FUROSEMIDE 20 MG PO TABS: 20.0000 mg | ORAL_TABLET | Freq: Every day | ORAL | Status: DC | PRN

## 2014-09-30 NOTE — Assessment & Plan Note (Signed)
We have encouraged continued exercise, careful diet management in an effort to lose weight. 

## 2014-09-30 NOTE — Assessment & Plan Note (Signed)
Significant recent stressors, lost his father 2 days ago. Also recent episode of amnesia, seen by neurology. Unable to exclude. If hypercapnia as mental slowing after he was woken from a deep sleep. High risk of sleep apnea

## 2014-09-30 NOTE — Patient Instructions (Addendum)
You are doing well. Heart rate mildly elevated today  Please increase the flecainide to 150 mg twice a day Take lasix as needed for shortness of breath  Please call us if you have new issues that need to be addressed before your next appt.  Your physician wants you to follow-up in: 1 month.

## 2014-09-30 NOTE — Progress Notes (Signed)
Patient ID: Stanley Kane, male    DOB: [birth year], 49 y.o.   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  Developing atrial fibrillation in 2013 with cardioversion 07/06/2012, successful restoring of his normal sinus rhythm.  Previous clinic visit with recurrent atrial fibrillation started back on anticoagulation, after several weeks started on flecainide Suspected obstructive sleep apnea, not on CPAP . Sleep apnea noted during anesthesia of previous cardioversion  He was scheduled for cardioversion December 2015 and on arrival was in normal sinus rhythm. Prior to that had been started on flecainide. Cardioversion was canceled.  On his last clinic follow-up, he was not taking his medications including including anticoagulation and flecainide. Reason was unclear He went back into atrial fibrillation seen on EKG in the office It was recommended that he restart his medications  In follow-up today, he reports that he feels relatively well though recently lost his father. Wife reports he had a period of amnesia after she woke him from a deep sleep. Went to the hospital, workup was unremarkable. Unable to exclude hypercapnia as he is not on CPAP. He and  his wife are hoping to avoid sleep studies and CPAP His weight has been trending upwards, eating poorly. 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 Wife reports he has anger issues when driving . He  previously went for routine DOT physical and this was denied as he was in atrial fibrillation and not on anticoagulation .   Prior  Echocardiogram showed ejection fraction 50%, mild dilatation of the left  atrium, no significant valvular disease  He reports having a stress test at [facility] 6 years ago. This was reportedly normal.  Allergies  Allergen Reactions  . Vicodin [Hydrocodone-Acetaminophen]     Makes him "jittery"    Outpatient Encounter Prescriptions as of 09/30/2014  Medication Sig  . digoxin (LANOXIN) 0.25 MG tablet Take 1 tablet (0.25 mg total) by mouth daily.  . flecainide (TAMBOCOR) 100 MG tablet Take 1 tablet (100 mg total) by mouth 2 (two) times daily.  . magnesium oxide (MAG-OX) 400 MG tablet Take 400 mg by mouth daily.  . metoprolol succinate (TOPROL-XL) 50 MG 24 hr tablet Take 1 tablet (50 mg total) by mouth daily.  . rivaroxaban (XARELTO) 20 MG TABS tablet Take 1 tablet (20 mg total) by mouth daily with supper.  . sertraline (ZOLOFT) 25 MG tablet Take 1 tablet (25 mg total) by mouth daily.    Past Medical History  Diagnosis Date  . Arrhythmia     "skips a beat occasionally"  . Obesity   . Heart murmur   . Atrial fibrillation   . Prediabetes   . Fatty liver 06/2014    by abd [place]  . 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 Tonsillectomy and adenoidectomy  childhood  . Cardiovascular stress test  ~2005    thinks at [facility], told normal  . Colonoscopy  2000    WNL, for weight loss after started gym    Social History  reports that he has never smoked. He has never used smokeless tobacco. He  reports that he drinks about 1.2 oz of alcohol per week. He reports that he does not use illicit drugs.  Family History family history includes Cancer in his father; Coronary artery disease (age of onset: 79) in his father; Diabetes in his mother; Heart attack in his father; Heart disease in his mother; Hypertension in his father. There is no history of Stroke.   Review of Systems  Constitutional: Negative.   Respiratory: Positive for shortness of breath.    Cardiovascular: Positive for palpitations.  Gastrointestinal: Negative.   Musculoskeletal: Negative.   Neurological: Negative.   Hematological: Negative.   All other systems reviewed and are negative.   BP 110/72 mmHg  Pulse 106  Ht  (1.778 m)  Wt 250 lb 8 oz (113.626 kg)  BMI 35.94 kg/m2  Physical Exam  Constitutional: He is oriented to person, place, and time. He appears well-developed and well-nourished.  obese  HENT:  Head: Normocephalic.  Nose: Nose normal.  Mouth/Throat: Oropharynx is clear and moist.  Eyes: Conjunctivae are normal. Pupils are equal, round, and reactive to light.  Neck: Normal range of motion. Neck supple. No JVD present.  Cardiovascular: S1 normal, S2 normal, normal heart sounds and intact distal pulses.  An irregularly irregular rhythm present. Tachycardia present.  Exam reveals no gallop and no friction rub.   No murmur heard. Pulmonary/Chest: Effort normal and breath sounds normal. No respiratory distress. He has no wheezes. He has no rales. He exhibits no tenderness.  Abdominal: Soft. Bowel sounds are normal. He exhibits no distension. There is no tenderness.  Musculoskeletal: Normal range of motion. He exhibits no edema or tenderness.  Lymphadenopathy:    He has no cervical adenopathy.  Neurological: He is alert and oriented to person, place, and time. Coordination normal.  Skin: Skin is warm and dry. No rash noted. No erythema.  Psychiatric: He has a normal mood and affect. His behavior is normal. Judgment and thought content normal.      Assessment and Plan   Nursing note and vitals reviewed.

## 2014-09-30 NOTE — Assessment & Plan Note (Signed)
Shortness of breath likely secondary to obesity, deconditioning. Unable to exclude mild chronic diastolic CHF in the setting of atrial fibrillation. We have given him Lasix to take as needed for worsening shortness of breath, ankle edema

## 2014-09-30 NOTE — Assessment & Plan Note (Signed)
He does not want to attempt cardioversion at this time. Heart rate mildly increased. We'll increase his flecainide up to 150 mill grams twice a day for rate and possible rhythm control. He is taking his anticoagulation faithfully though having difficulty getting coverage by insurance. Stressed the importance of weight loss, even sleep study. They have declined a sleep study at this time. I will see him back in close follow-up to discuss cardioversion again

## 2014-10-03 ENCOUNTER — Encounter: Payer: Self-pay | Admitting: Family Medicine

## 2014-10-03 ENCOUNTER — Ambulatory Visit (INDEPENDENT_AMBULATORY_CARE_PROVIDER_SITE_OTHER): Payer: No Typology Code available for payment source | Admitting: Family Medicine

## 2014-10-03 VITALS — BP 122/70 | HR 84 | Temp 98.0°F | Wt 247.5 lb

## 2014-10-03 DIAGNOSIS — R0683 Snoring: Secondary | ICD-10-CM

## 2014-10-03 DIAGNOSIS — I4891 Unspecified atrial fibrillation: Secondary | ICD-10-CM

## 2014-10-03 DIAGNOSIS — F411 Generalized anxiety disorder: Secondary | ICD-10-CM

## 2014-10-03 DIAGNOSIS — E669 Obesity, unspecified: Secondary | ICD-10-CM

## 2014-10-03 DIAGNOSIS — G454 Transient global amnesia: Secondary | ICD-10-CM

## 2014-10-03 DIAGNOSIS — M953 Acquired deformity of neck: Secondary | ICD-10-CM

## 2014-10-03 MED ORDER — SERTRALINE HCL 50 MG PO TABS
50.0000 mg | ORAL_TABLET | Freq: Every day | ORAL | Status: DC
Start: 2014-10-03 — End: 2014-10-17

## 2014-10-03 NOTE — Progress Notes (Signed)
Pre visit review using our clinic review tool, if applicable. No additional management support is needed unless otherwise documented below in the visit note. 

## 2014-10-03 NOTE — Patient Instructions (Addendum)
I think you are doing well. 2 referrals placed today - to Dr Sherryll BurgerShah and for sleep evaluation. Pass by Shirlee [facility]imer[facility]ion or Allison's office to schedule these appointments. Let's increase sertraline to 50mg  daily. Return in 2 months for follow up visit.
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
## 2014-10-05 ENCOUNTER — Encounter: Payer: Self-pay | Admitting: Family Medicine

## 2014-10-05 DIAGNOSIS — M953 Acquired deformity of neck: Secondary | ICD-10-CM | POA: Insufficient documentation

## 2014-10-05 DIAGNOSIS — G454 Transient global amnesia: Secondary | ICD-10-CM | POA: Insufficient documentation

## 2014-10-05 NOTE — Assessment & Plan Note (Signed)
Rate but not rhythm controlled today - continue flecainide, xarelto, and digoxin and toprol xl. followed closely by cardiology.

## 2014-10-05 NOTE — Assessment & Plan Note (Addendum)
Likely stress related, sxs have now resolved..  Stroke w/u unrevealing. Reassured this should not cause any long term consequences or recur. Does not need neuro f/u from this standpoint. See above for neuro referral. Not likely migraine cause - advised ok to stop magnesium.
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
## 2014-10-05 NOTE — Assessment & Plan Note (Signed)
Body mass index is 35.51 kg/(m^2).

## 2014-10-05 NOTE — Assessment & Plan Note (Signed)
Increase sertraline to 50 mg daily

## 2014-10-05 NOTE — Assessment & Plan Note (Signed)
Concern during hospitalization for OSA, as well as concerning body habitus and endorsed snoring, and apneic episodes. ESS only = 5. Regardless, will refer to pulm for eval of appropriateness for sleep study.

## 2014-10-17 ENCOUNTER — Other Ambulatory Visit: Payer: Self-pay | Admitting: Family Medicine

## 2014-10-17 ENCOUNTER — Other Ambulatory Visit: Payer: Self-pay | Admitting: *Deleted

## 2014-10-17 MED ORDER — SERTRALINE HCL 50 MG PO TABS: 50.0000 mg | ORAL_TABLET | Freq: Every day | ORAL | Status: DC

## 2014-10-20 ENCOUNTER — Ambulatory Visit: Payer: Self-pay | Admitting: Cardiovascular Disease

## 2014-10-27 ENCOUNTER — Other Ambulatory Visit: Payer: Self-pay

## 2014-10-27 MED ORDER — RIVAROXABAN 20 MG PO TABS: 20.0000 mg | ORAL_TABLET | Freq: Every day | ORAL | Status: DC

## 2014-11-03 ENCOUNTER — Ambulatory Visit: Payer: Self-pay | Admitting: Cardiovascular Disease

## 2014-11-10 ENCOUNTER — Telehealth: Payer: Self-pay | Admitting: Family Medicine

## 2014-11-10 NOTE — Telephone Encounter (Signed)
Noted. Will see when he can get into town.

## 2014-11-10 NOTE — Telephone Encounter (Signed)
Patient Name: Stanley Kane  DOB: 09-24-1965    Initial Comment Caller states her boyfriend is truck driver on the road, has burning left hand pain after feeling a pop in it about a month ago, was swollen at first but has decreased, xfer from office for triage   Nurse Assessment  Nurse: Scarlette ArStandifer, RN, Herbert SetaHeather Date/Time (Eastern Time): 11/10/2014 8:56:21 AM  Confirm and document reason for call. If symptomatic, describe symptoms. ---Caller states her boyfriend is truck driver on the road, has burning left hand pain after feeling a pop in it about a month ago, was swollen at first but has decreased,  Has the patient traveled out of the country within the last 30 days? ---Not Applicable  Does the patient require triage? ---Yes  Related visit to physician within the last 2 weeks? ---No  Does the PT have any chronic conditions? (i.e. diabetes, asthma, etc.) ---Yes  List chronic conditions. ---A-fib  Did the patient indicate they were pregnant? ---No     Guidelines    Guideline Title Affirmed Question Affirmed Notes  Hand and Wrist Injury Can't use injured hand normally (e.g., make a fist, open fully, hold a glass of water)    Final Disposition User   See Physician within 24 Hours Standifer, RN, Insurance underwriterHeather    Comments  Caller states that patient is out of town right now and made an appt at the office for next week. Caller states that she will call back sooner if he gets worse or if he is able to come to the office sooner.

## 2014-11-10 NOTE — Telephone Encounter (Signed)
Pt has appt with Dr Sharen HonesGutierrez on 11/24/14.

## 2014-11-18 ENCOUNTER — Institutional Professional Consult (permissible substitution): Payer: Self-pay | Admitting: Pulmonary Disease

## 2014-11-24 ENCOUNTER — Encounter: Payer: Self-pay | Admitting: Cardiovascular Disease

## 2014-11-24 ENCOUNTER — Ambulatory Visit (INDEPENDENT_AMBULATORY_CARE_PROVIDER_SITE_OTHER)
Admission: RE | Admit: 2014-11-24 | Discharge: 2014-11-24 | Disposition: A | Discharge status: Home / Self Care | Payer: BLUE CROSS/BLUE SHIELD | Source: Ambulatory Visit | Attending: Family Medicine | Admitting: Family Medicine

## 2014-11-24 ENCOUNTER — Other Ambulatory Visit: Payer: Self-pay | Admitting: Cardiovascular Disease

## 2014-11-24 ENCOUNTER — Encounter: Payer: Self-pay | Admitting: Family Medicine

## 2014-11-24 ENCOUNTER — Ambulatory Visit (INDEPENDENT_AMBULATORY_CARE_PROVIDER_SITE_OTHER): Payer: BLUE CROSS/BLUE SHIELD | Admitting: Family Medicine

## 2014-11-24 ENCOUNTER — Ambulatory Visit (INDEPENDENT_AMBULATORY_CARE_PROVIDER_SITE_OTHER): Payer: BLUE CROSS/BLUE SHIELD | Admitting: Cardiovascular Disease

## 2014-11-24 VITALS — BP 100/62 | HR 84 | Ht 69.0 in | Wt 240.0 lb

## 2014-11-24 VITALS — BP 142/50 | HR 68 | Temp 97.8°F | Wt 241.1 lb

## 2014-11-24 DIAGNOSIS — M79643 Pain in unspecified hand: Secondary | ICD-10-CM | POA: Insufficient documentation

## 2014-11-24 DIAGNOSIS — R0683 Snoring: Secondary | ICD-10-CM | POA: Diagnosis not present

## 2014-11-24 DIAGNOSIS — I4891 Unspecified atrial fibrillation: Secondary | ICD-10-CM

## 2014-11-24 DIAGNOSIS — M79646 Pain in unspecified finger(s): Secondary | ICD-10-CM

## 2014-11-24 DIAGNOSIS — M25532 Pain in left wrist: Secondary | ICD-10-CM

## 2014-11-24 DIAGNOSIS — R7309 Other abnormal glucose: Secondary | ICD-10-CM

## 2014-11-24 DIAGNOSIS — R7303 Prediabetes: Secondary | ICD-10-CM

## 2014-11-24 DIAGNOSIS — R0602 Shortness of breath: Secondary | ICD-10-CM | POA: Diagnosis not present

## 2014-11-24 DIAGNOSIS — E669 Obesity, unspecified: Secondary | ICD-10-CM

## 2014-11-24 LAB — BASIC METABOLIC PANEL
ANION GAP: 8 (ref 7–16)
BUN: 17 mg/dL
Calcium, Total: 9.2 mg/dL
Chloride: 101 mmol/L
Co2: 27 mmol/L
Creatinine: 0.93 mg/dL
EGFR (Non-African Amer.): >60
Glucose: 95 mg/dL
Potassium: 4.5 mmol/L
SODIUM: 136 mmol/L

## 2014-11-24 LAB — CBC WITH DIFFERENTIAL/PLATELET
BASOS ABS: 0.1 10*3/uL (ref 0.0–0.1)
BASOS PCT: 0.9 %
Eosinophil #: 0.2 10*3/uL (ref 0.0–0.7)
Eosinophil %: 2.9 %
HCT: 47.3 % (ref 40.0–52.0)
HGB: 15.7 g/dL (ref 13.0–18.0)
LYMPHS ABS: 2 10*3/uL (ref 1.0–3.6)
Lymphocyte %: 26.6 %
MCH: 31.6 pg (ref 26.0–34.0)
MCHC: 33.3 g/dL (ref 32.0–36.0)
MCV: 95 fL (ref 80–100)
MONO ABS: 0.7 x10 3/mm (ref 0.2–1.0)
Monocyte %: 9.4 %
NEUTROS ABS: 4.5 10*3/uL (ref 1.4–6.5)
Neutrophil %: 60.2 %
Platelet: 331 10*3/uL (ref 150–440)
RBC: 4.99 10*6/uL (ref 4.40–5.90)
RDW: 13.2 % (ref 11.5–14.5)
WBC: 7.4 10*3/uL (ref 3.8–10.6)

## 2014-11-24 LAB — PROTIME-INR
INR: 1.1
Prothrombin Time: 14.6 secs

## 2014-11-24 MED ORDER — METOPROLOL SUCCINATE ER 50 MG PO TB24: 50.0000 mg | ORAL_TABLET | Freq: Every day | ORAL | Status: DC

## 2014-11-24 MED ORDER — RIVAROXABAN 20 MG PO TABS: 20.0000 mg | ORAL_TABLET | Freq: Every day | ORAL | Status: DC

## 2014-11-24 MED ORDER — PREDNISONE 20 MG PO TABS: ORAL_TABLET | ORAL | Status: DC

## 2014-11-24 MED ORDER — DIGOXIN 250 MCG PO TABS: 0.2500 mg | ORAL_TABLET | Freq: Every day | ORAL | Status: DC

## 2014-11-24 MED ORDER — FUROSEMIDE 20 MG PO TABS: 20.0000 mg | ORAL_TABLET | Freq: Every day | ORAL | Status: DC | PRN

## 2014-11-24 MED ORDER — FLECAINIDE ACETATE 150 MG PO TABS: 150.0000 mg | ORAL_TABLET | Freq: Two times a day (BID) | ORAL | Status: DC

## 2014-11-24 NOTE — Patient Instructions (Addendum)
You are doing well. No medication changes were made.  We will schedule a cardioversion for atrial fibrillation  Please call us if you have new issues that need to be addressed before your next appt.  Your physician wants you to follow-up in: 1 month.   You are scheduled for a Cardioversion on _Tuesday, March 29____ with Dr.  Mariah MillingGollan [facility] at 7:30 a.m. on the day of your procedure.  DIET INSTRUCTIONS:  Nothing to eat or drink after midnight except your medications with a              sip of water.         1) Labs: __CBC, BMET, PT/INR________________  2) Medications:  YOU MAY TAKE ALL of your remaining medications with a small amount of water, EXCEPT YOUR LASIX  3) Must have a responsible person to drive you home.  4) Bring a current list of your medications and current insurance cards.    If you have any questions after you get home, please call the office at [phone]

## 2014-11-24 NOTE — Assessment & Plan Note (Signed)
Arthralgias predominantly DIPs. ?OA but xray R hand previously without significant arthritic burden. Not quite consistent with stenosing tenosynovitis either.  Consider RA vs gout as cause.

## 2014-11-24 NOTE — Assessment & Plan Note (Addendum)
He has mild shortness of breath, also feels it is affecting him in other ways. We will schedule him for cardioversion tomorrow, recommended he stay on his current medications. Risk and benefits discussed with him. We will order lab work in preparation today

## 2014-11-24 NOTE — Assessment & Plan Note (Signed)
We have encouraged continued exercise, careful diet management in an effort to lose weight. 

## 2014-11-24 NOTE — Assessment & Plan Note (Signed)
Shortness of breath has improved with better heart rate control and Lasix. Will continue current medications for now

## 2014-11-24 NOTE — Assessment & Plan Note (Signed)
Again we discussed sleep apnea with him. He is on to lose more weight. Not interested in a sleep study

## 2014-11-24 NOTE — Assessment & Plan Note (Addendum)
Anticipate dequervain tenosynovitis. Xray to r/o scaphoid fracture given location of pain. Doubt sensory radial nerve entrapment.  Treat with steroid course (cannot take NSAIDs as on xarelto) and thumb spica wrist brace. rec start B complex vitamin as well. Update if not improving with treatment. Pt agrees with plan.

## 2014-11-24 NOTE — Patient Instructions (Addendum)
I think you have tendonitis and possibly hyperextended nerve as well. Treat with prednisone course and wrist brace. xray to rule out fracture Continue cold water. Let us know if not improving with treatment.

## 2014-11-24 NOTE — Assessment & Plan Note (Signed)
Weight is down 13 pounds from his prior clinic visit.

## 2014-11-24 NOTE — Progress Notes (Signed)
BP 142/50 mmHg  Pulse 68  Temp(Src) 97.8 F (36.6 C) (Oral)  Wt 241 lb 1.9 oz (109.371 kg)   CC: L hand pain/burning  Subjective:    Patient ID: Stanley Kane, male    DOB: Jan 09, 1966, 49 y.o.   MRN: [id number]  HPI: Stanley Kane is a 49 y.o. male presenting on 11/24/2014 for Hand Pain   2-3 wks ago while getting out of bed, placed pressure on L hand and wrist and felt "pop" sensation and since then has been having burning pain lateral radial wrist just proximal to scaphoid, having difficulty carrying anything on left hand, as well as burning pain and sensitivit even to hair.   Known carpal tunnel syndrome bilaterally. Has not had this treated.   Persistent trouble with hand stiffness mainly in the mornings. Points to PIP bilateral 2nd and 3rd fingers. Denies redness or swelling of joints. Just stiffness and tenderness. Cold water helps loosen joints. Has tried tylenol.   Recent transient global amnesia 08/2014 s/p hospitalization with normal MRI and neurology evaluation. He also saw Dr Sherryll Burger in follow up.   Relevant past medical, surgical, family and social history reviewed and updated as indicated. Interim medical history since our last visit reviewed. Allergies and medications reviewed and updated. Current Outpatient Prescriptions on File Prior to Visit  Medication Sig  . digoxin (LANOXIN) 0.25 MG tablet Take 1 tablet (0.25 mg total) by mouth daily.  . flecainide (TAMBOCOR) 150 MG tablet Take 1 tablet (150 mg total) by mouth 2 (two) times daily.  . furosemide (LASIX) 20 MG tablet Take 1 tablet (20 mg total) by mouth daily as needed.  . metoprolol succinate (TOPROL-XL) 50 MG 24 hr tablet Take 1 tablet (50 mg total) by mouth daily.  . rivaroxaban (XARELTO) 20 MG TABS tablet Take 1 tablet (20 mg total) by mouth daily with supper.  . sertraline (ZOLOFT) 50 MG tablet Take 1 tablet (50 mg total) by mouth daily.   No current facility-administered medications on file prior to visit.     Review of Systems Per HPI unless specifically indicated above     Objective:    BP 142/50 mmHg  Pulse 68  Temp(Src) 97.8 F (36.6 C) (Oral)  Wt 241 lb 1.9 oz (109.371 kg)  Wt Readings from Last 3 Encounters:  11/24/14 241 lb 1.9 oz (109.371 kg)  11/24/14 240 lb (108.863 kg)  10/03/14 247 lb 8 oz (112.265 kg)    Physical Exam  Constitutional: He is oriented to person, place, and time. He appears well-developed and well-nourished. No distress.  Musculoskeletal: He exhibits edema (mild swelling around L wrist).  Tender to palpation along lateral L radial wrist at APL and EPB tendons, sensitive skin without rash Limited ROM 2/2 pain predominantly wrist extension  Mild tenderness to palpation PIP bilateral middle and index fingers without obvious triggering or flexor nodule appreciated. No erythema or active synovitis.  Neurological: He is alert and oriented to person, place, and time.  Neg tinel, unable to complete phalen 2/2 pain.  Skin: Skin is warm and dry. No rash noted.  No vesicular rash  Psychiatric: He has a normal mood and affect.  Nursing note and vitals reviewed.     Assessment & Plan:   Problem List Items Addressed This Visit    Pain of hand and fingers    Arthralgias predominantly DIPs. ?OA but xray R hand previously without significant arthritic burden. Not quite consistent with stenosing tenosynovitis either.  Consider RA vs  gout as cause.      Left wrist pain - Primary    Anticipate dequervain tenosynovitis. Xray to r/o scaphoid fracture given location of pain. Doubt sensory radial nerve entrapment.  Treat with steroid course (cannot take NSAIDs as on xarelto) and thumb spica wrist brace. rec start B complex vitamin as well. Update if not improving with treatment. Pt agrees with plan.      Relevant Orders   DG Wrist Complete Left       Follow up plan: Return if symptoms worsen or fail to improve.
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
## 2014-11-25 ENCOUNTER — Ambulatory Visit: Payer: Self-pay | Admitting: Cardiovascular Disease

## 2014-11-25 ENCOUNTER — Telehealth: Payer: Self-pay

## 2014-11-25 DIAGNOSIS — I4891 Unspecified atrial fibrillation: Secondary | ICD-10-CM | POA: Diagnosis not present

## 2014-11-25 MED ORDER — FLECAINIDE ACETATE 100 MG PO TABS: 100.0000 mg | ORAL_TABLET | Freq: Two times a day (BID) | ORAL | Status: DC

## 2014-11-25 NOTE — Telephone Encounter (Signed)
Pt wife called states pt HR is staying around 61-62

## 2014-11-25 NOTE — Telephone Encounter (Signed)
Spoke w/ pt.  Advised him that Dr. Mariah MillingGollan recommends he reduce his flecainide to 100 mg BID. He states that took his am meds today, but has not taken his pm meds as of yet.  He reports that he took his last 150 mg pill this am, but has some 100 mg pills at home.  Advised him to resume this dose tonight and I am sending in new rx for lower dose.  Asked him to call back w/ any further questions or concerns.

## 2014-11-26 ENCOUNTER — Telehealth: Payer: Self-pay | Admitting: *Deleted

## 2014-11-26 NOTE — Telephone Encounter (Signed)
Would call again to confirm that his heart rate is staying low

## 2014-11-26 NOTE — Telephone Encounter (Signed)
Pt girlfriend states that his hr is around 60 again. And this was after he had worked out in the yard. She is a bit concerned.  Please advise.

## 2014-11-26 NOTE — Telephone Encounter (Signed)
Stanley SaasHiraa M Malhotra at 11/26/2014 12:08 PM     Status: Signed       Expand All Collapse All   Pt girlfriend states that his hr is around 60 again. And this was after he had worked out in the yard. She is a bit concerned.  Please advise.

## 2014-11-26 NOTE — Telephone Encounter (Signed)
Please see previous phone note.  

## 2014-11-27 MED ORDER — METOPROLOL SUCCINATE ER 25 MG PO TB24: ORAL_TABLET | ORAL | Status: DC

## 2014-11-27 NOTE — Addendum Note (Signed)
Addended by: Rhea BeltonMOODY, Poetry Cerro R on: 11/27/2014 05:47 PM   Modules accepted: Orders

## 2014-11-27 NOTE — Telephone Encounter (Signed)
Left message for pt to call back  °

## 2014-11-27 NOTE — Telephone Encounter (Signed)
Spoke w/ pt.  He states that he decided to go back to work and is currently in [place]. Reports his HR today is around 49-50. He states that he is taking the flecainide 100 mg BID and that he feels good. Please advise.  Thank you.

## 2014-11-27 NOTE — Telephone Encounter (Signed)
Spoke w/ pt.  Advised him that Dr. Mariah MillingGollan recommends he cut his metoprolol back to 25 mg once daily. He states that he has already taken today, he was trying to take this in the evenings, but got his days and nights mixed up, so he will start lower dose tomorrow.  Asked him to keep us posted on his HR and let us know how he is doing.

## 2014-12-08 ENCOUNTER — Ambulatory Visit: Payer: No Typology Code available for payment source | Admitting: Family Medicine

## 2014-12-08 ENCOUNTER — Telehealth: Payer: Self-pay | Admitting: *Deleted

## 2014-12-08 DIAGNOSIS — Z0289 Encounter for other administrative examinations: Secondary | ICD-10-CM

## 2014-12-08 NOTE — Telephone Encounter (Signed)
Pt c/o BP issue: STAT if pt c/o blurred vision, one-sided weakness or slurred speech  1. What are your last 5 BP readings?  And this reading below was taken about 30 min after he took medication.  135/82 saturday afternoon  2. Are you having any other symptoms (ex. Dizziness, headache, blurred vision, passed out)?  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 What is your BP issue?  Had a 2 episodes this on Saturday   Felt very "slugish", like he couldn't get going. Went out to eat thinking sugar was low, but had no way of checking. Still wasn't feeling well. Just letting us know to see what should be done.  Pt is just dragging along. Even today Please advise. But other than this pt was feeling great.

## 2014-12-08 NOTE — Telephone Encounter (Signed)
Will forward to Dr. Gollan for review. 

## 2014-12-08 NOTE — Telephone Encounter (Signed)
Spoke w/ pt. He reports that he is currently on a run to [place]. Reports his HR has been around 78-81 recently, "bounces around in that range".  Pt was diagnosed w/ prediabetes last year and was worried that he had low BG. He does not have a monitor but reports that his hands were shaky & he was trembling, but felt better after he ate.  He states that his girlfriend is concerned that his sx are r/t metoprolol. He would like me to make Dr. Mariah MillingGollan and call him back w/ any recommendations.

## 2014-12-08 NOTE — Telephone Encounter (Signed)
Left message for pt to call back  °

## 2014-12-08 NOTE — Telephone Encounter (Signed)
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

## 2014-12-09 NOTE — Telephone Encounter (Signed)
Spoke w/ pt.  Advised him of Dr. Windell HummingbirdGollan's recommendation.  He verbalizes understanding and states that he will f/u w/ PCP re his sugars when he is back in town.

## 2014-12-28 NOTE — Discharge Summary (Signed)
PATIENT NAME:  Stanley ChihuahuaBLACK, Stanley E MR#:  [id number] DATE OF BIRTH:  1965/11/27  DATE OF ADMISSION:  09/25/2014 DATE OF DISCHARGE:  09/26/2014  ADMITTING DIAGNOSIS: Global amnesia.   DISCHARGE DIAGNOSES: 1.  Global amnesia with complicated migraine.  2.  Atrial fibrillation on anticoagulation.  3.  Hyperglycemia.  4.  Headache. 5.  Obesity.   DISCHARGE CONDITION: Stable.   DISCHARGE MEDICATIONS: The patient is to continue flecainide 100 mg p.o. twice daily, digoxin 250 mcg p.o. daily, Avapro 75 mg 2 tablets every 6 hours as needed, metoprolol succinate 50 mg p.o. daily, sertraline 25 mg p.o. daily, Xarelto 20 mg once daily, magnesium oxide 500 mg p.o. 2 tablets once at bedtime, vitamin B complex 100, 1 tablet once daily.  HOME OXYGEN: None.   DIET: A 2-gram sodium, low-fat, low-cholesterol, regular consistency.  ACTIVITY LIMITATIONS: As tolerated.  FOLLOWUP APPOINTMENT: With Dr. Sharen HonesGutierrez in 2 days after discharge.   CONSULTANTS: Care management; social work; neurology, Pauletta BrownsYuriy Zeylikman, MD.   RADIOLOGIC STUDIES: Echocardiogram on 09/26/2014 revealing left ventricular ejection fraction by visual estimation of 60% to 65%, normal global left ventricular systolic function, normal right ventricular size and systolic function, mildly dilated left atrium, mild mitral valve regurgitation, mild tricuspid regurgitation, normal right ventricular systolic pressure. MRI of brain with and without contrast on 09/26/2014 revealing normal appearance of brain itself, abnormal appearance of the skull base at C1-C2 articulation which could be congenital or posttraumatic. This is not well evaluated with brain MRI. This could possibly be associated with chronic headache. If there is concern about craniocervical disease with headaches, plain radiographs and/or CT scan would be more useful, according to the radiologist.  HOSPITAL COURSE: The patient is a 49 year old male who presents to the hospital with complaints  of headache and amnesia episode. Please refer to Dr. Mathews RobinsonsWieting's admission note on 09/25/2014. Apparently, the patient had been traveling to [place] to visit his sick father, has been taking off work, and presents after falling asleep on the couch and being disoriented after he woke up.  VITAL SIGNS ON ADMISSION: On arrival to the Emergency Room, the patient's vital signs: Temperature was 97.7, pulse was 77, respiration rate was 18, blood pressure 116/57, saturation was 96% on room air.  PHYSICAL EXAM: Unremarkable.  LABORATORY DATA: Done on arrival to the hospital revealed BMP with glucose level of 123, otherwise BMP was unremarkable. The patient's liver enzymes were remarkable for albumin level of 3.2. Troponin level was less than 0.02. Digoxin was 0.85. White blood cell count was normal at 10.2, hemoglobin was 14.5, platelet count was 336,000. Coagulation panel: Prothrombin time was 20.3 and INR was 1.8. Urinalysis was remarkable for 3 red blood cells, less than 1 white blood cell. EKG showed atrial fibrillation at a rate of 78. No acute ST-T changes were noted.   RADIOLOGIC STUDIES: CT scan of the head without contrast on 09/25/2014 revealed a negative head CT. Chest portable single-view x-ray 09/25/2014 showed no edema or consolidation. Carotid ultrasound 09/25/2014 revealing normal carotid ultrasound.   Patient was admitted to the hospital for further evaluation. He underwent CVA workup, and Dr. Loretha BrasilZeylikman, neurologist was consulted. Dr. Loretha BrasilZeylikman saw the patient in consultation on 09/26/2014. He felt that patient's presentation with transient periods of confusion and disorientation could be transient global amnesia versus complicated headache or migraine. Apparently, according to Dr. Loretha BrasilZeylikman, the patient has been having headaches for about 2 weeks now. He recommended to continue Xarelto. In regards to headache, he recommended Decadron once IV while in  the hospital, and then as outpatient he  recommended to get magnesium over-the-counter as well as vitamin B complex. The patient is being discharged to home with the above-mentioned medications and followup. Of note, due to the patient's minimal cervical changes, it is recommended to get MRI of the cervical spine to better evaluate the C1-C2 articulation, or CT scan of the cervical spine. No trauma was reported, however, according to the patient.   VITAL SIGNS ON THE DAY OF DISCHARGE: Temperature was 98.4, pulse was 80, respiratory rate was 16, blood pressure 108/68, saturation was 96% on room air at rest.  TIME SPENT: 40 minutes.     ____________________________ Katharina Caper, MD rv:ST D: 10/03/2014 20:23:00 ET T: 10/04/2014 02:06:08 ET JOB#: [id number]  cc: [name], MD Katharina Caper, MD, <Dictator>   Riana Tessmer MD ELECTRONICALLY SIGNED 10/09/2014 16:05

## 2014-12-28 NOTE — H&P (Signed)
PATIENT NAME:  Stanley Kane, Stanley Kane MR#:  [id number] DATE OF BIRTH:  08/30/1965  DATE OF ADMISSION:  09/25/2014  PRIMARY CARE PHYSICIAN:  Eustaquio BoydenJavier Gutierrez, MD  CARDIOLOGIST:  Antonieta Ibaimothy J. Gollan, MD   CHIEF COMPLAINT:  Memory loss.   HISTORY OF PRESENT ILLNESS:  This is a 49 year old man who has been under a lot of stress recently. He has been going back and forth to [place] to visit with his sick father, who does not have much time left. He took off this week from work. 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 He took his medications this morning; he remembers that. He fell asleep on the couch. He woke up. He was disoriented. He did not remember that his dad was sick in the hospital in [place]. He could not remember the year. He did not even recall that his girlfriend called EMS. He was confused at the time. He was also nauseous. Today, he did have a headache and last night also. In the ER, his initial workup was negative. Hospitalist services were contacted for further evaluation. The patient did not have any difficulty with his speech or his strength and feels okay right now except for a little nausea.   PAST MEDICAL HISTORY:  Atrial fibrillation, anxiety.   PAST SURGICAL HISTORY:  None.   ALLERGIES:  No known drug allergies.   MEDICATIONS:  Include digoxin 250 mcg daily, flecainide 100 mg twice a day, ibuprofen 200 mg every 6 hours as needed for headache pain, metoprolol succinate 50 mg extended release once a day, Zoloft 25 mg in the morning, Xarelto 20 mg in the morning.   SOCIAL HISTORY:  No smoking. Rare alcohol. No drug use. Lives with his girlfriend.   FAMILY HISTORY:  Father with liver disease believed to be due to chemicals. Mother with atrial fibrillation.   REVIEW OF SYSTEMS: CONSTITUTIONAL:  Positive for weight loss. No fever, chills, or sweats. Positive for fatigue.  EYES:  He does wear glasses.  Ears, nose, mouth, and throat:  No hearing loss.  No sore throat. No difficulty swallowing.  CARDIOVASCULAR:  No chest pain. Occasional palpitation.  RESPIRATORY:  Positive for shortness of breath with walking. No cough. No sputum. No hemoptysis.  GASTROINTESTINAL:  Positive for nausea. No vomiting. No abdominal pain. No diarrhea. No constipation. No bright red blood per rectum. No melena.  GENITOURINARY:  No burning on urination or hematuria.  MUSCULOSKELETAL:  No joint pain or muscle pain.  INTEGUMENT:  No rashes or eruptions.  NEUROLOGIC:  No fainting or blackouts.  PSYCHIATRIC:  Positive for anxiety and stress.  ENDOCRINE:  No thyroid problems.  HEMATOLOGIC AND LYMPHATIC:  No anemia.   PHYSICAL EXAMINATION:  VITAL SIGNS:  Last included temperature of 97.7, pulse 77, respirations 18, blood pressure 116/57, pulse oximetry 96% on room air.  GENERAL:  No respiratory distress.  EYES:  Conjunctivae and lids are normal. Pupils are equal, round, and reactive to light. Extraocular muscles are intact. No nystagmus.  EARS, NOSE, MOUTH, AND THROAT:  Tympanic membranes: No erythema. Nasal mucosa:  No erythema. Throat:  No erythema. No exudate seen. Lips and gums:  No lesions.  NECK:  No JVD. No bruits. No lymphadenopathy. No thyromegaly. No thyroid nodules palpated.  LUNGS:  Clear to auscultation. No use of accessory muscles to breathe. No rhonchi, rales, or wheeze heard.  CARDIOVASCULAR:  S1 and S2 normal, irregular. No gallops, rubs, or murmurs heard. Carotid upstroke  2+ bilaterally. No bruits. Dorsalis pedis pulses 2+ bilaterally. No edema of the lower extremity.  ABDOMEN:  Soft, nontender. No organomegaly/splenomegaly. Normoactive bowel sounds. No masses felt.  LYMPHATIC:  No lymph nodes in the neck.  MUSCULOSKELETAL:  No clubbing, edema, or cyanosis.  SKIN:  No rashes or ulcers seen.  NEUROLOGIC:  Cranial nerves II through XII are grossly intact. Deep tendon reflexes are 2+ bilaterally in the lower extremities. Power is 5/5 in upper and lower  extremities. Sensation is grossly intact to light touch. Babinski is negative.  PSYCHIATRIC:  The patient is oriented to person, place, and time.   LABORATORY AND RADIOLOGICAL DATA:  Urinalysis is negative. Chest x-ray is negative. CAT scan of the head is negative.   Ethanol level is negative. INR is 1.8. Troponin is negative. Glucose is 123, BUN 15, creatinine 0.99, sodium 138, potassium 4.0, chloride 106, CO2 of 26, and calcium is 8.6. Liver function tests are in normal range. White blood cell count is 10.2, hemoglobin and hematocrit 14.5 and 42.9, and platelet count is 336,000.   EKG:  Atrial fibrillation. No acute ST-T wave changes.   ASSESSMENT AND PLAN: 1.  Transient global amnesia. I think the patient has been under a lot of stress with his father sick and commuting back and forth and working. I think this is probably a stress response or being woken up from a deep sleep and being a little confused afterwards. I will get a transient ischemic attack workup with MRI of the brain, carotid ultrasound, and echocardiogram. Continue the Xarelto.  2.  Atrial fibrillation, rate controlled on digoxin, flecainide, and metoprolol. Anticoagulated with Xarelto.  3.  Anxiety. Continue Zoloft.   TIME SPENT ON ADMISSION:  50 minutes.   The patient will be admitted as an observation. Of note, ER physician, Dr. Mayford Knife, spoke with neurologist on-call, Dr. Loretha Brasil, about the case, who recommended the workup.    ____________________________ Herschell Dimes. Renae Gloss, MD rjw:nb D: 09/25/2014 20:21:00 ET T: 09/25/2014 23:34:57 ET JOB#: [id number]  cc: Herschell Dimes. Renae Gloss, MD, <Dictator> [name], MD Antonieta Iba, MD  Salley Scarlet MD ELECTRONICALLY SIGNED 09/26/2014 14:46

## 2014-12-28 NOTE — Consult Note (Signed)
PATIENT NAME:  Stanley Kane, Stanley Kane MR#:  [id number] DATE OF BIRTH:  01/09/1966  DATE OF CONSULTATION:  09/26/2014  REFERRING PHYSICIAN:   CONSULTING PHYSICIAN:  Pauletta BrownsYuriy Jachob Mcclean, MD  REASON FOR CONSULTATION: Confusion.   HISTORY OF PRESENT ILLNESS: A 49 year old gentleman with increased amount of stress in the past few weeks due to poor health of his father, with history of atrial fibrillation, compliant on Xarelto, admitted with 1-day history of confusion and disorientation, could not remember the events that happened the night prior. The patient is complaining of a 2-week history of headache. The headache is described as in the posterior occipital region, radiating into bilateral frontal areas. No photophobia, no phonophobia. Headache is still persistent, but mental status appears to be at baseline.   PAST MEDICAL HISTORY: Atrial fibrillation, anxiety.   PAST SURGICAL HISTORY: None.   HOME MEDICATIONS: Have been reviewed.   SOCIAL HISTORY: No smoke or social EtOH use.   FAMILY HISTORY: Father, history of liver disease.   REVIEW OF SYSTEMS: No shortness of breath, no chest pain, no abdominal pain. No weakness on one side of the body compared to the other. Positive for confusion. No anxiety. No depression.   NEUROLOGICAL EVALUATION: The patient is able tell me the reason why he is in the hospital, his name. Speech appears to be fluent. Extraocular movements are intact. Tongue is midline. Uvula elevates symmetrically. Shoulder shrug is intact. Motor strength appears to be 5/5 bilateral upper and lower extremities. Sensation intact to light touch and temperature. Coordination: Finger-to-nose intact   IMPRESSION: A 49 year old male comes in with a transient period of  confusion, disorientation. I agree that this could possibly be a  transient global amnesia versus a complicated headache/migraine, as the patient has been having headaches for about 2 weeks.   PLAN:  1. Continuation of Xarelto. The  patient states he takes it daily for his atrial fibrillation. MRI of the brain is pending. If there is any ischemia on the MRI of the brain, would strongly consider switching him to another anticoagulant agent, such as Eliquis.  2. Headache, Decadron 10 mg IV x1 for headache sensation. As an outpatient, the patient was told to take daily magnesium over-the-counter 500 mg and vitamin B complex. Once imaging is complete and headache is improved, likely safe to be discharged from a neurological standpoint.   This case discussed with the patient at bedside.   Thank you. It was a pleasure seeing this patient.     ____________________________ Pauletta BrownsYuriy Terreon Ekholm, MD yz:mw D: 09/26/2014 11:58:37 ET T: 09/26/2014 12:24:00 ET JOB#: [id number]  cc: Pauletta BrownsYuriy Maryiah Olvey, MD, <Dictator> Pauletta BrownsYURIY Isreal Moline MD ELECTRONICALLY SIGNED 10/07/2014 12:06

## 2014-12-29 ENCOUNTER — Encounter: Payer: Self-pay | Admitting: Cardiovascular Disease

## 2014-12-29 ENCOUNTER — Ambulatory Visit (INDEPENDENT_AMBULATORY_CARE_PROVIDER_SITE_OTHER): Payer: BLUE CROSS/BLUE SHIELD | Admitting: Cardiovascular Disease

## 2014-12-29 VITALS — BP 92/60 | HR 69 | Ht 69.0 in | Wt 243.0 lb

## 2014-12-29 DIAGNOSIS — I481 Persistent atrial fibrillation: Secondary | ICD-10-CM

## 2014-12-29 DIAGNOSIS — I4819 Other persistent atrial fibrillation: Secondary | ICD-10-CM

## 2014-12-29 DIAGNOSIS — R7309 Other abnormal glucose: Secondary | ICD-10-CM

## 2014-12-29 DIAGNOSIS — I951 Orthostatic hypotension: Secondary | ICD-10-CM

## 2014-12-29 DIAGNOSIS — R0602 Shortness of breath: Secondary | ICD-10-CM | POA: Diagnosis not present

## 2014-12-29 DIAGNOSIS — G4733 Obstructive sleep apnea (adult) (pediatric): Secondary | ICD-10-CM | POA: Diagnosis not present

## 2014-12-29 DIAGNOSIS — R7303 Prediabetes: Secondary | ICD-10-CM

## 2014-12-29 NOTE — Assessment & Plan Note (Signed)
He is currently not having symptoms from low blood pressure. Recommended he decrease his Lasix dosing to as needed or twice per week, down from daily

## 2014-12-29 NOTE — Progress Notes (Signed)
Patient ID: Stanley Kane, male    DOB: [birth year], 49 y.o.   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  Developing atrial fibrillation in 2013 with cardioversion 07/06/2012, successful restoring of his normal sinus rhythm.  Previous clinic visit with recurrent atrial fibrillation started back on anticoagulation, after several weeks started on flecainide Obesity, Suspected obstructive sleep apnea, not on CPAP . Sleep apnea noted during anesthesia of previous cardioversion   On a earlier visit in 2016, he hasn't taking his anticoagulation and flecainide 150 mg twice a day, he had converted back to atrial fibrillation, was relatively asymptomatic He underwent cardioversion as it felt it was affecting him in some ways, such as his sex life. This was done on 11/25/2014. Normal sinus rhythm successfully restored. He was maintained on anticoagulation, flecainide, Lasix daily, metoprolol. He presents today and reports that he feels his heart rate is out but is not sure, no significant shortness of breath, very mild with heavy exertion. Overall feels relatively well. Blood pressures running low but denies having any orthostatic symptoms He is not interested in doing a sleep study at this time  EKG on today's visit shows atrial fibrillation with ventricular rate 89 bpm, no significant ST or T-wave changes  Other past medical history  He was scheduled for cardioversion December 2015 and on arrival was in normal sinus rhythm. Prior to that had been started on flecainide. Cardioversion was canceled.  Wife reports he had a period of amnesia after she woke him from a deep sleep. Went to the hospital, workup was unremarkable. Unable to exclude hypercapnia as he is not on CPAP. He and  his wife are hoping to avoid sleep studies and  CPAP His weight has been trending upwards, eating poorly. Not exercising  Significant stress and anxiety when driving his stroke in the [place] area . Wife reports he has anger issues when driving . He  previously went for routine DOT physical and this was denied as he was in atrial fibrillation and not on anticoagulation .   Prior  Echocardiogram showed ejection fraction 50%, mild dilatation of the left atrium, no significant valvular disease  He reports having a stress test at [place] 6 years ago. This was reportedly normal.  Allergies  Allergen Reactions  . Vicodin [Hydrocodone-Acetaminophen]     Makes him "jittery"    Current Outpatient Prescriptions on File Prior to Visit  Medication Sig Dispense Refill  . digoxin (LANOXIN) 0.25 MG tablet Take 1 tablet (0.25 mg total) by mouth daily. 30 tablet 11  . flecainide (TAMBOCOR) 100 MG tablet Take 1 tablet (100 mg total) by mouth 2 (two) times daily. 60 tablet 6  . furosemide (LASIX) 20 MG tablet Take 1 tablet (20 mg total) by mouth daily as needed. 30 tablet 11  . metoprolol succinate (TOPROL-XL) 25 MG 24 hr tablet Take one tab by mouth once daily 30 tablet 6  . predniSONE (DELTASONE) 20 MG tablet Take two tablets daily for 3 days followed by one tablet daily for 4 days 10 tablet 0  . rivaroxaban (XARELTO) 20 MG TABS tablet Take 1 tablet (20 mg total) by mouth daily with supper. 30 tablet 11  . sertraline (ZOLOFT) 50 MG tablet Take 1 tablet (50 mg total) by mouth daily. 30 tablet 6   No current facility-administered medications on file prior to visit.  Past Medical History  Diagnosis Date  . Arrhythmia     "skips a beat occasionally"  . Obesity   . Heart murmur   . Atrial fibrillation   . Prediabetes   . Fatty liver 06/2014    by abd US  . 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 Tonsillectomy and adenoidectomy  childhood   . Cardiovascular stress test  ~2005    [facility], told normal  . Colonoscopy  2000    WNL, for weight loss after started gym  . [place] echocardiography  08/2014    EF 60%, nl vent fxn, mildly dilated LA, mild MR, mild TR  . Mri  08/2014    brain - WNL, skull base C1/2 articulation  . Carotid us  08/2014    no plaque    Social History  reports that he has never smoked. He has never used smokeless tobacco. He reports that he drinks about 1.2 oz of alcohol per week. He reports that he does not use illicit drugs.  Family History family history includes Cancer in his father; Coronary artery disease (age of onset: 6230) in his father; Diabetes in his mother; Heart attack in his father; Heart disease in his mother; Hypertension in his father. There is no history of Stroke.   Review of Systems  Constitutional: Negative.   Respiratory: Negative.   Cardiovascular: Positive for palpitations.  Gastrointestinal: Negative.   Musculoskeletal: Negative.   Neurological: Negative.   Hematological: Negative.   All other systems reviewed and are negative.   BP 92/60 mmHg  Pulse 69  Ht 5\' 9"  (1.753 m)  Wt 243 lb (110.224 kg)  BMI 35.87 kg/m2  Physical Exam  Constitutional: He is oriented to person, place, and time. He appears well-developed and well-nourished.  obese  HENT:  Head: Normocephalic.  Nose: Nose normal.  Mouth/Throat: Oropharynx is clear and moist.  Eyes: Conjunctivae are normal. Pupils are equal, round, and reactive to light.  Neck: Normal range of motion. Neck supple. No JVD present.  Cardiovascular: S1 normal, S2 normal, normal heart sounds and intact distal pulses.  An irregularly irregular rhythm present. Exam reveals no gallop and no friction rub.   No murmur heard. Pulmonary/Chest: Effort normal and breath sounds normal. No respiratory distress. He has no wheezes. He has no rales. He exhibits no tenderness.  Abdominal: Soft. Bowel sounds are normal. He exhibits no  distension. There is no tenderness.  Musculoskeletal: Normal range of motion. He exhibits no edema or tenderness.  Lymphadenopathy:    He has no cervical adenopathy.  Neurological: He is alert and oriented to person, place, and time. Coordination normal.  Skin: Skin is warm and dry. No rash noted. No erythema.  Psychiatric: He has a normal mood and affect. His behavior is normal. Judgment and thought content normal.      Assessment and Plan   Nursing note and vitals reviewed.

## 2014-12-29 NOTE — Assessment & Plan Note (Signed)
Mild shortness of breath with exertion, likely from deconditioning, obesity and underlying atrial fibrillation. Symptoms improved with Lasix.

## 2014-12-29 NOTE — Patient Instructions (Signed)
You are doing well.  Please change lasix to as needed for leg swelling, ABD bloating, shortness of breath  Please call us if you have new issues that need to be addressed before your next appt.  Your physician wants you to follow-up in: 6 months.  You will receive a reminder letter in the mail two months in advance. If you don't receive a letter, please call our office to schedule the follow-up appointment.

## 2014-12-29 NOTE — Assessment & Plan Note (Signed)
He does not want any changes to his medications or repeat cardioversion at this time. Overall feels well. Recommended he continue to lose weight, consider sleep study. If he has worsening symptoms, would refer to EP

## 2014-12-29 NOTE — Assessment & Plan Note (Signed)
Discussed the benefits of a sleep study with him. Likely causing his recurrent atrial fibrillation. He does not want sleep study at this time. Prefers to try weight loss

## 2014-12-29 NOTE — Assessment & Plan Note (Signed)
We have encouraged continued exercise, careful diet management in an effort to lose weight. 

## 2015-01-12 ENCOUNTER — Telehealth: Payer: Self-pay | Admitting: *Deleted

## 2015-01-12 ENCOUNTER — Telehealth: Payer: Self-pay | Admitting: Family Medicine

## 2015-01-12 DIAGNOSIS — M25532 Pain in left wrist: Secondary | ICD-10-CM

## 2015-01-12 DIAGNOSIS — M79646 Pain in unspecified finger(s): Secondary | ICD-10-CM

## 2015-01-12 DIAGNOSIS — M79643 Pain in unspecified hand: Secondary | ICD-10-CM

## 2015-01-12 NOTE — Telephone Encounter (Signed)
Spoke w/ pt.  Discussed w/ him at length the benefits of CPAP and treating OSA.  Advised him that sleep apnea can trigger his afib and cause his daytime somnolence.  Pt states that he is interested in setting up sleep study and will discuss this w/ Dr. Nicanor AlconGutierrez's office.  Asked him to call back w/ any other questions or concerns.

## 2015-01-12 NOTE — Telephone Encounter (Signed)
Stanley Kane called stating Stanley Kane hand is still same.  He would like a referral to the hand clinic in [place] where dr sypher is.  They don't care who the dr is they just want that clinic

## 2015-01-12 NOTE — Telephone Encounter (Signed)
Pt girlfriend calling stating that pt is complaining a lot more of being tired.  Pt is on the road and she said  Last time he was on metoprolol it did this, not sure if it is doing this again.  Last time we lowered the dose so not sure if this will help Also on Zoloft. Not sure if it could be that He is still taking everything not stopping anything.  States this is not normal for pt. He is usually a high energy person.

## 2015-01-13 NOTE — Telephone Encounter (Signed)
referral placed

## 2015-01-27 ENCOUNTER — Telehealth: Payer: Self-pay

## 2015-01-27 NOTE — Telephone Encounter (Signed)
Left message for pt to call back.  Advised her to make sure that pt is staying hydrated, that he is only taking his lasix prn and to have him lie down if he becomes symptomatic.  Advised her that it is after hours, but she may call the main number and speak w/ the PA on call if symptomatic before we open at 8:00 tomorrow.

## 2015-01-27 NOTE — Telephone Encounter (Signed)
States pt was "doing regular activities"  And had an episode that he "felt weird". states BP was 102/64 today, this also happened 2 days ago and his BP was 102/65. Please call.Denies CP, SOB. Was lightheaded to the point he has to sit down.

## 2015-01-29 ENCOUNTER — Encounter: Payer: Self-pay | Admitting: Family Medicine

## 2015-01-29 DIAGNOSIS — G5600 Carpal tunnel syndrome, unspecified upper limb: Secondary | ICD-10-CM | POA: Insufficient documentation

## 2015-03-17 ENCOUNTER — Telehealth: Payer: Self-pay

## 2015-03-17 ENCOUNTER — Telehealth: Payer: Self-pay | Admitting: *Deleted

## 2015-03-17 NOTE — Telephone Encounter (Signed)
PLEASE NOTE: All timestamps contained within this report are represented as [place] Standard Time. CONFIDENTIALTY NOTICE: This fax transmission is intended only for the addressee. It contains information that is legally privileged, confidential or otherwise protected from use or disclosure. If you are not the intended recipient, you are strictly prohibited from reviewing, disclosing, copying using or disseminating any of this information or taking any action in reliance on or regarding this information. If you have received this fax in error, please notify us immediately by telephone so that we can arrange for its return to us. Phone: [phone], Toll-Free: [phone], Fax: [phone] Page: 1 of 1 Call Id: [id number] [facility] - Client TELEPHONE ADVICE RECORD [facility] Patient Name: Stanley Kane Gender: Male DOB: March 15, 1966 Age: [age] Y 5 M 5 D Return Phone Number: [phone] (Primary), [phone] (Secondary) Address: [place]: [place] [id number] Client [facility] Client [facility] Day Physician Eustaquio BoydenGutierrez, Javier Contact Type Call Caller Name [name] Caller Phone Number n/a Relationship To Patient Friend Is this call to report lab results? No Call Type General Information Initial Comment Caller states her boyfriend has been having episodes of lightheadedness and dizziness. Caller was transferred to us from the office to schedule an appointment. caller was transferred back to the office to set up appointment. General Information Type Appointment Nurse Assessment Guidelines Guideline Title Affirmed Question Affirmed Notes Nurse Date/Time (Eastern Time) Disp. Time Lamount Cohen(Eastern Time) Disposition Final User 03/17/2015 4:11:53 PM General Information Provided Yes Sarita HaverJones, Cassandra After Care Instructions Given Call Event Type User Date / Time Description

## 2015-03-17 NOTE — Telephone Encounter (Signed)
Pt has appt on 03/18/15 at 10:30 AM with Dr Ermalene SearingBedsole.

## 2015-03-17 NOTE — Telephone Encounter (Signed)
Noted  

## 2015-03-17 NOTE — Telephone Encounter (Signed)
Pt girlfriend calling stating that pt has had few episodes Thought it was his inner ear.  Now he's stating to her that he's had some dizzy spells  Stating that he is always tired.  When he goes to bed, he "gets knock out"  She is concerned for pt for he is on Xarelto.  Please advise.

## 2015-03-17 NOTE — Telephone Encounter (Signed)
Returned phone call. Left message on machine for patient's girlfriend (on DPI) to contact the office.

## 2015-03-18 ENCOUNTER — Ambulatory Visit: Payer: Self-pay | Admitting: Family Medicine

## 2015-03-19 ENCOUNTER — Other Ambulatory Visit: Payer: Self-pay | Admitting: Neurology

## 2015-03-19 DIAGNOSIS — R42 Dizziness and giddiness: Secondary | ICD-10-CM

## 2015-03-19 DIAGNOSIS — W19XXXA Unspecified fall, initial encounter: Secondary | ICD-10-CM

## 2015-03-24 ENCOUNTER — Ambulatory Visit (INDEPENDENT_AMBULATORY_CARE_PROVIDER_SITE_OTHER): Payer: BLUE CROSS/BLUE SHIELD | Admitting: Family Medicine

## 2015-03-24 ENCOUNTER — Encounter: Payer: Self-pay | Admitting: Family Medicine

## 2015-03-24 VITALS — BP 104/70 | HR 80 | Temp 97.7°F | Wt 247.5 lb

## 2015-03-24 DIAGNOSIS — S060X0S Concussion without loss of consciousness, sequela: Secondary | ICD-10-CM

## 2015-03-24 NOTE — Progress Notes (Signed)
Pre visit review using our clinic review tool, if applicable. No additional management support is needed unless otherwise documented below in the visit note.  Episodes of sudden dizziness.  Resolves quickly.  No presyncope.  No syncope. Can happen at rest, sitting, laying down.  No sx when rolling over in the bed.    He feel about 1 week before sx started but didn't remember hitting his head.  Some headaches since then.    Has seen neuro in the meantime- note reviewed by care everywhere- presumed concussion per notes.  D/w pt.  He has f/u MRI pending per Dr. Sherryll Burger.    Has used antivert in the meantime with some relief.   Meds, vitals, and allergies reviewed.   ROS: See HPI.  Otherwise, noncontributory.  GEN: nad, alert and oriented HEENT: mucous membranes moist NECK: supple w/o LA CV: IRR, not tachy PULM: ctab, no inc wob ABD: soft, +bs EXT: no edema CN 2-12 wnl B, S/S/DTR wnl x4 DHP neg B

## 2015-03-24 NOTE — Patient Instructions (Signed)
Rest, avoid loud noise and bright lights.  Use meclizine as needed.  I'll check with neuro.  Take care.

## 2015-03-25 DIAGNOSIS — S060X0A Concussion without loss of consciousness, initial encounter: Secondary | ICD-10-CM | POA: Insufficient documentation

## 2015-03-25 NOTE — Assessment & Plan Note (Signed)
Presumed, with f/u MRI pending and outside records pending.  D/w pt about concussion, treatment, relative rest.  Can continue meclizine for now.  Sx less likely be from BPV.  D/w pt.  Will ask for MRI to be moved up if possible. >25 minutes spent in face to face time with patient, >50% spent in counselling or coordination of care.

## 2015-03-26 ENCOUNTER — Encounter: Payer: Self-pay | Admitting: *Deleted

## 2015-03-26 NOTE — Telephone Encounter (Signed)
Note and OV faxed to Dr. Sherryll Burger at [facility].

## 2015-03-26 NOTE — Telephone Encounter (Signed)
-----   Message from Joaquim Nam, MD sent at 03/25/2015 12:21 PM EDT ----- Please send a note to Dr. Sherryll Burger with [facility] along with OV from yesterday.  Patient has MRI pending per neuro.  Patient asked if that could be moved up, ie sooner.  I'll defer to neuro.  Patient doesn't have f/u set here- I'll defer to neuro Sherryll Burger wants patient to f/u here.  Thanks.

## 2015-03-30 ENCOUNTER — Ambulatory Visit
Admission: RE | Admit: 2015-03-30 | Discharge: 2015-03-30 | Disposition: A | Discharge status: Home / Self Care | Payer: BLUE CROSS/BLUE SHIELD | Source: Ambulatory Visit | Attending: Neurology | Admitting: Neurology

## 2015-03-30 DIAGNOSIS — W19XXXA Unspecified fall, initial encounter: Secondary | ICD-10-CM | POA: Insufficient documentation

## 2015-03-30 DIAGNOSIS — R42 Dizziness and giddiness: Secondary | ICD-10-CM | POA: Insufficient documentation

## 2015-03-30 DIAGNOSIS — R9089 Other abnormal findings on diagnostic imaging of central nervous system: Secondary | ICD-10-CM | POA: Diagnosis not present

## 2015-03-30 MED ORDER — GADOBENATE DIMEGLUMINE 529 MG/ML IV SOLN
20.0000 mL | Freq: Once | INTRAVENOUS | Status: AC | PRN
  Administered 2015-03-30: 20 mL via INTRAVENOUS

## 2015-03-31 ENCOUNTER — Telehealth: Payer: Self-pay | Admitting: Family Medicine

## 2015-03-31 NOTE — Telephone Encounter (Signed)
Left detailed message on voicemail.  

## 2015-03-31 NOTE — Telephone Encounter (Signed)
Pt needs note that states that he is able to return to work,.  Pt stated that MRI report is normal. Pt states that dizzy spells have stopped.  Pt would like to pick up this afternoon if possible. Callback [phone] if needed  Thanks.

## 2015-03-31 NOTE — Telephone Encounter (Signed)
Letter done. Thanks.

## 2015-05-06 ENCOUNTER — Ambulatory Visit (INDEPENDENT_AMBULATORY_CARE_PROVIDER_SITE_OTHER): Payer: BLUE CROSS/BLUE SHIELD | Admitting: Internal Medicine

## 2015-05-06 ENCOUNTER — Encounter: Payer: Self-pay | Admitting: Internal Medicine

## 2015-05-06 VITALS — BP 98/64 | HR 87 | Temp 98.2°F | Wt 245.0 lb

## 2015-05-06 DIAGNOSIS — F411 Generalized anxiety disorder: Secondary | ICD-10-CM | POA: Diagnosis not present

## 2015-05-06 MED ORDER — CITALOPRAM HYDROBROMIDE 10 MG PO TABS: 10.0000 mg | ORAL_TABLET | Freq: Every day | ORAL | Status: DC

## 2015-05-06 NOTE — Progress Notes (Signed)
Pre visit review using our clinic review tool, if applicable. No additional management support is needed unless otherwise documented below in the visit note. 

## 2015-05-06 NOTE — Patient Instructions (Signed)
Generalized Anxiety Disorder Generalized anxiety disorder (GAD) is a mental disorder. It interferes with life functions, including relationships, work, and school. GAD is different from normal anxiety, which everyone experiences at some point in their lives in response to specific life events and activities. Normal anxiety actually helps us prepare for and get through these life events and activities. Normal anxiety goes away after the event or activity is over.  GAD causes anxiety that is not necessarily related to specific events or activities. It also causes excess anxiety in proportion to specific events or activities. The anxiety associated with GAD is also difficult to control. GAD can vary from mild to severe. People with severe GAD can have intense waves of anxiety with physical symptoms (panic attacks).  SYMPTOMS The anxiety and worry associated with GAD are difficult to control. This anxiety and worry are related to many life events and activities and also occur more days than not for 6 months or longer. People with GAD also have three or more of the following symptoms (one or more in children):  Restlessness.   Fatigue.  Difficulty concentrating.   Irritability.  Muscle tension.  Difficulty sleeping or unsatisfying sleep. DIAGNOSIS GAD is diagnosed through an assessment by your health care provider. Your health care provider will ask you questions aboutyour mood,physical symptoms, and events in your life. Your health care provider may ask you about your medical history and use of alcohol or drugs, including prescription medicines. Your health care provider may also do a physical exam and blood tests. Certain medical conditions and the use of certain substances can cause symptoms similar to those associated with GAD. Your health care provider may refer you to a mental health specialist for further evaluation. TREATMENT The following therapies are usually used to treat GAD:    Medication. Antidepressant medication usually is prescribed for long-term daily control. Antianxiety medicines may be added in severe cases, especially when panic attacks occur.   Talk therapy (psychotherapy). Certain types of talk therapy can be helpful in treating GAD by providing support, education, and guidance. A form of talk therapy called cognitive behavioral therapy can teach you healthy ways to think about and react to daily life events and activities.  Stress managementtechniques. These include yoga, meditation, and exercise and can be very helpful when they are practiced regularly. A mental health specialist can help determine which treatment is best for you. Some people see improvement with one therapy. However, other people require a combination of therapies. Document Released: 12/10/2012 Document Revised: 12/30/2013 Document Reviewed: 12/10/2012 ExitCare Patient Information 2015 [employer]. This information is not intended to replace advice given to you by your health care provider. Make sure you discuss any questions you have with your health care provider.  

## 2015-05-06 NOTE — Progress Notes (Signed)
Subjective:    Patient ID: Stanley Kane, male    DOB: 02-13-1966, 49 y.o.   MRN: [id number]  HPI  Pt presents to the clinic today to discuss anxiety. 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 He worries excessively, has racing thoughts and has trouble sleeping. Even when he does get sleep, he still feels tired and fatigued. He is very irritable. He has been treated for this in the past with Zoloft (he stopped in a few months ago). He did not feel like the Zoloft was very helpful. He reports he also had worsening side effects- he felt more depressed and had more mood swings. He denies SI/HI.  Review of Systems      Past Medical History  Diagnosis Date  . Arrhythmia     "skips a beat occasionally"  . Obesity   . Heart murmur   . Atrial fibrillation   . Prediabetes   . Fatty liver 06/2014    by abd [place]  . 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 digoxin (LANOXIN) 0.25 MG tablet Take 1 tablet (0.25 mg total) by mouth daily. 30 tablet 11  . flecainide (TAMBOCOR) 100 MG tablet Take 1 tablet (100 mg total) by mouth 2 (two) times daily. 60 tablet 6  . furosemide (LASIX) 20 MG tablet Take 1 tablet (20 mg total) by mouth daily as needed. 30 tablet 11  . meclizine (ANTIVERT) 25 MG tablet Take 12.5-25 mg by mouth 3 (three) times daily as needed for dizziness.    . metoprolol succinate (TOPROL-XL) 25 MG 24 hr tablet Take one tab by mouth once daily 30 tablet 6  . rivaroxaban (XARELTO) 20 MG TABS tablet Take 1 tablet (20 mg total) by mouth daily with supper. 30 tablet 11   No current facility-administered medications for this visit.    Allergies  Allergen Reactions  . Vicodin [Hydrocodone-Acetaminophen]     Makes him "jittery"    Family History  Problem Relation Age of Onset  . Hypertension Father   . Cancer Father     liver  . Coronary artery  disease Father 56  . Heart attack Father   . Diabetes Mother   . Heart disease Mother   . Stroke Neg Hx     Social History   Social History  . Marital Status: Divorced    Spouse Name: N/A  . Number of Children: N/A  . Years of Education: N/A   Occupational History  . Not on file.   Social History Main Topics  . Smoking status: Former Smoker    Quit date: 08/29/1996  . Smokeless tobacco: Never Used  . Alcohol Use: 1.2 oz/week    2 Cans of beer per week     Comment: Occasional  . Drug Use: No  . Sexual Activity: Yes   Other Topics Concern  . Not on file   Social History Narrative   Caffeine: 1 cup coffee   Lives with GF ([name] Katrinka Blazing), 1 dog   Occupation: truck Hospital doctor   Edu: 9th grade, GED   Activity: enjoys gym but no time, starting walking   Diet: good water, fruits/vegetables daily     Constitutional: Denies fever, malaise, fatigue, headache or abrupt weight changes.  Respiratory: Denies difficulty breathing, shortness of breath, cough or sputum production.   Cardiovascular: Denies chest pain, chest tightness, palpitations  or swelling in the hands or feet.  Neurological: Denies dizziness, difficulty with memory, difficulty with speech or problems with balance and coordination.  Psych: Pt reports anxiety. Denies depression, SI/HI.  No other specific complaints in a complete review of systems (except as listed in HPI above).  Objective:   Physical Exam  BP 98/64 mmHg  Pulse 87  Temp(Src) 98.2 F (36.8 C) (Oral)  Wt 245 lb (111.131 kg)  SpO2 98% Wt Readings from Last 3 Encounters:  05/06/15 245 lb (111.131 kg)  03/30/15 246 lb (111.585 kg)  03/24/15 247 lb 8 oz (112.265 kg)    General: Appears his stated age, obese in NAD. Cardiovascular: Normal rate and rhythm.  Pulmonary/Chest: Normal effort and positive vesicular breath sounds. No respiratory distress. No wheezes, rales or ronchi noted.  Neurological: Alert and oriented.  Psychiatric: He does  engage but his affect is mildly flat. He is slightly anxious. He avoids eye contact.  BMET    Component Value Date/Time   NA 136 11/24/2014 1048   NA 138 09/25/2014   NA 139 06/17/2014 0805   K 4.5 11/24/2014 1048   K 4.0 09/25/2014   K 4.8 10/10/2011   CL 101 11/24/2014 1048   CL 100 07/28/2014 1109   CO2 27 11/24/2014 1048   CO2 22 07/28/2014 1109   GLUCOSE 95 11/24/2014 1048   GLUCOSE 93 07/28/2014 1109   GLUCOSE 116* 06/17/2014 0805   BUN 17 11/24/2014 1048   BUN 15 07/28/2014 1109   BUN 15 06/17/2014 0805   CREATININE 0.93 11/24/2014 1048   CREATININE 1.0 09/25/2014   CREATININE 1.09 07/28/2014 1109   CREATININE 1.20 10/10/2011   CALCIUM 9.2 11/24/2014 1048   CALCIUM 9.6 07/28/2014 1109   CALCIUM 9.2 10/10/2011   GFRNONAA >60 11/24/2014 1048   GFRNONAA >60 09/25/2014 1731   GFRNONAA 80 07/28/2014 1109   GFRAA >60 11/24/2014 1048   GFRAA >60 09/25/2014 1731   GFRAA 93 07/28/2014 1109    Lipid Panel     Component Value Date/Time   CHOL 157 09/25/2014 1731   CHOL 157 09/25/2014   CHOL 175 10/10/2011   TRIG 250* 09/25/2014 1731   TRIG 250* 09/25/2014   TRIG 93 10/10/2011   HDL 33* 09/25/2014 1731   HDL 33* 09/25/2014   CHOLHDL 4 06/17/2014 0805   VLDL 50* 09/25/2014 1731   VLDL 21.2 06/17/2014 0805   LDLCALC 74 09/25/2014 1731   LDLCALC 74 09/25/2014    CBC    Component Value Date/Time   WBC 7.4 11/24/2014 1048   WBC 10.2 09/25/2014   WBC 6.9 06/17/2014 0805   RBC 4.99 11/24/2014 1048   RBC 4.98 07/28/2014 1109   RBC 4.95 06/17/2014 0805   HGB 15.7 11/24/2014 1048   HGB 14.5 09/25/2014   HCT 47.3 11/24/2014 1048   HCT 47.0 07/28/2014 1109   HCT 45 10/10/2011   PLT 331 11/24/2014 1048   PLT 336 09/25/2014   MCV 95 11/24/2014 1048   MCV 94 07/28/2014 1109   MCH 31.6 11/24/2014 1048   MCH 32.1 07/28/2014 1109   MCHC 33.3 11/24/2014 1048   MCHC 34.0 07/28/2014 1109   MCHC 33.4 06/17/2014 0805   RDW 13.2 11/24/2014 1048   RDW 14.4 07/28/2014  1109   RDW 13.7 06/17/2014 0805   LYMPHSABS 2.0 11/24/2014 1048   LYMPHSABS 2.7 07/28/2014 1109   LYMPHSABS 1.9 06/17/2014 0805   MONOABS 0.7 11/24/2014 1048   MONOABS 0.8 06/17/2014 0805   EOSABS  0.2 11/24/2014 1048   EOSABS 0.3 07/28/2014 1109   EOSABS 0.3 06/17/2014 0805   BASOSABS 0.1 11/24/2014 1048   BASOSABS 0.1 07/28/2014 1109   BASOSABS 0.0 06/17/2014 0805    Hgb A1C Lab Results  Component Value Date   HGBA1C 5.7 09/25/2014         Assessment & Plan:   Generalized Anxiety Disorder:  Support offered today Failed Zoloft Discussed other treatment options. He is interested in trying another medication. eRx for Celexa 10 mg daily He is not interested in therapy referral at this time  RTC in 1 month to follow up anxiety

## 2015-05-23 ENCOUNTER — Other Ambulatory Visit: Payer: Self-pay | Admitting: Family Medicine

## 2015-06-16 ENCOUNTER — Encounter: Payer: BLUE CROSS/BLUE SHIELD | Admitting: Family Medicine

## 2015-06-16 ENCOUNTER — Telehealth: Payer: Self-pay | Admitting: Family Medicine

## 2015-06-16 DIAGNOSIS — Z0289 Encounter for other administrative examinations: Secondary | ICD-10-CM

## 2015-06-16 NOTE — Telephone Encounter (Signed)
Pt did not come in for their appt today for cpe. Please let me know if pt needs to be contacted immediately for follow up or no follow up needed. Best phone number to contact pt is [phone].

## 2015-06-17 NOTE — Telephone Encounter (Signed)
yes would offer CPE at pt's convenience. Thanks.

## 2015-07-03 ENCOUNTER — Ambulatory Visit (INDEPENDENT_AMBULATORY_CARE_PROVIDER_SITE_OTHER): Payer: BLUE CROSS/BLUE SHIELD | Admitting: Primary Care

## 2015-07-03 ENCOUNTER — Encounter: Payer: Self-pay | Admitting: Primary Care

## 2015-07-03 VITALS — BP 96/70 | HR 85 | Temp 98.5°F | Ht 69.0 in | Wt 247.8 lb

## 2015-07-03 DIAGNOSIS — F411 Generalized anxiety disorder: Secondary | ICD-10-CM

## 2015-07-03 MED ORDER — ESCITALOPRAM OXALATE 10 MG PO TABS: 10.0000 mg | ORAL_TABLET | Freq: Every day | ORAL | Status: DC

## 2015-07-03 NOTE — Assessment & Plan Note (Signed)
Slight improvement with citalopram 10 mg although continues to feel symptoms of GAD. Due to history of afib, current management of flecainide, and risk of QT prolongation with citalopram, with stop citalopram and start Lexapro. Start Lexapro 10 mg tomorrow. Follow up in 4-6 weeks for re-evaluation.

## 2015-07-03 NOTE — Progress Notes (Signed)
Subjective:    Patient ID: Stanley Kane, male    DOB: [birth year], 49 y.o.   MRN: [id number]  HPI  Mr. Stanley Kane is a 49 year old male who presents today with a chief complaint of anxiety. He is currently managed on citalopram 10 mg that was initiated on 05/06/15. During his appointment in September he was experiencing increased stress with work (as he's a Psychologist, sport and exercisebusiness owner) with feelings of irritability, fatigue, difficulty sleeping. He was trialed on Zoloft in the past as he didn't feel it was helpful.  Since his last visit he's noticed an improvement on the citalopram but continues to feel stressed and anxious. He will occasionally have difficulty sleeping at night due to racing thoughts. He continues to worry, will experience muscle tension and irritablity. Denies SI/HI, nausea, GI upset. He does experience headaches, but they are no worse than before. He is also managed on Flecainide 100 mg for heart rate control atrial fibrilation.  Review of Systems  Gastrointestinal: Negative for nausea and abdominal pain.  Neurological: Positive for headaches.  Psychiatric/Behavioral: Positive for sleep disturbance. Negative for suicidal ideas.       Past Medical History  Diagnosis Date  . Arrhythmia     "skips a beat occasionally"  . Obesity   . Heart murmur   . Atrial fibrillation (HCC)   . Prediabetes   . Fatty liver 06/2014    by abd [place]  . 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 Marital Status: Divorced    Spouse Name: N/A  . Number of Children: N/A  . Years of Education: N/A   Occupational History  . Not on file.   Social History Main Topics  . Smoking status: Former Smoker    Quit date: 08/29/1996  . Smokeless tobacco: Never Used  . Alcohol Use: 1.2 oz/week    2 Cans of beer per week     Comment: Occasional  . Drug Use: No  . Sexual Activity: Yes   Other Topics Concern  . Not on file    Social History Narrative   Caffeine: 1 cup coffee   Lives with GF ([name]), 1 dog   Occupation: truck driver   Edu: 9th grade, GED   Activity: enjoys gym but no time, starting walking   Diet: good water, fruits/vegetables daily    Past Surgical History  Procedure Laterality Date  . Tonsillectomy and adenoidectomy  childhood  . Cardiovascular stress test  ~2005    [facility], told normal  . Colonoscopy  2000    WNL, for weight loss after started gym  . [place] echocardiography  08/2014    EF 60%, nl vent fxn, mildly dilated LA, mild MR, mild TR  . Mri  08/2014    brain - WNL, skull base C1/2 articulation  . Carotid us  08/2014    no plaque    Family History  Problem Relation Age of Onset  . Hypertension Father   . Cancer Father     liver  . Coronary artery disease Father 630  . Heart attack Father   . Diabetes Mother   . Heart disease Mother   . Stroke Neg Hx     Allergies  Allergen Reactions  . Vicodin [Hydrocodone-Acetaminophen]     Makes him "jittery"    Current Outpatient Prescriptions on File Prior to Visit  Medication Sig Dispense Refill  .  digoxin (LANOXIN) 0.25 MG tablet Take 1 tablet (0.25 mg total) by mouth daily. 30 tablet 11  . flecainide (TAMBOCOR) 100 MG tablet Take 1 tablet (100 mg total) by mouth 2 (two) times daily. 60 tablet 6  . furosemide (LASIX) 20 MG tablet Take 1 tablet (20 mg total) by mouth daily as needed. 30 tablet 11  . metoprolol succinate (TOPROL-XL) 25 MG 24 hr tablet Take one tab by mouth once daily 30 tablet 6  . rivaroxaban (XARELTO) 20 MG TABS tablet Take 1 tablet (20 mg total) by mouth daily with supper. 30 tablet 11  . [DISCONTINUED] citalopram (CELEXA) 10 MG tablet Take 1 tablet (10 mg total) by mouth daily. 30 tablet 2   No current facility-administered medications on file prior to visit.    BP 96/70 mmHg  Pulse 85  Temp(Src) 98.5 F (36.9 C) (Oral)  Ht  (1.753 m)  Wt 247 lb 12.8 oz (112.401 kg)  BMI  36.58 kg/m2  SpO2 97%    Objective:   Physical Exam  Constitutional: He appears well-nourished.  Cardiovascular: Normal rate.  An irregularly irregular rhythm present.  Pulmonary/Chest: Effort normal and breath sounds normal.  Skin: Skin is warm and dry.  Psychiatric: He has a normal mood and affect.          Assessment & Plan:

## 2015-07-03 NOTE — Progress Notes (Signed)
Pre visit review using our clinic review tool, if applicable. No additional management support is needed unless otherwise documented below in the visit note. 

## 2015-07-03 NOTE — Patient Instructions (Signed)
Stop Citalopram (Celexa) 10 mg tablets.  Start Lexapro 10 mg tablets. Take 1 tablet by mouth daily for anxiety.  Follow up in 4-6 weeks with either myself or Dr. Sharen HonesGutierrez for re-evaluation.  It was a pleasure meeting you!

## 2015-07-20 ENCOUNTER — Encounter: Payer: Self-pay | Admitting: Family Medicine

## 2015-08-03 ENCOUNTER — Telehealth: Payer: Self-pay

## 2015-08-03 NOTE — Telephone Encounter (Signed)
Spoke w/ pt's GF.  Reports that they were on their way out to eat lunch when pt had an episode. Reports that pt became very anxious, "kind of blipped out", he kept talking but "didn't seem like he was all there", his heart was racing and he was very pale. BP 115/80, HR 120 at it's highest, but pt is in afib and this is "good for him".  Reports pt is lying on the couch now and feels weak. Pt recently changed antidepressants, reports that he had switched from Celexa to Lexapro, but this was switched back due to personality changes. Pt's GF believes that pt had a panic attack, as he has not processed and dealt w/ the death of his father earlier this year. She states that pt is under quite a bit of stress and she feels that he is "putting up a stiff upper lip for my benefit". She reports that pt denies chest pain, SOB, n/v or diaphoresis. Advised her to contact pt's PCP regarding anxiety issues. She is agreeable and will call Dr. Guy SandiferGuitierrez's office now. Asked her to call back if we can be of further assistance.

## 2015-08-03 NOTE — Telephone Encounter (Signed)
Pt girlfriend called, states pt just had an episode where his heart was fluttering. States he feels really weak and washed out. Please call. Denies CP, SOB. States he "kinda blanked out". States his pupils "kinda went pinpoint". He got really pale. States he feels extremely anxious.

## 2015-08-05 ENCOUNTER — Encounter: Payer: Self-pay | Admitting: Family Medicine

## 2015-08-05 ENCOUNTER — Encounter: Payer: Self-pay | Admitting: *Deleted

## 2015-08-05 ENCOUNTER — Ambulatory Visit (INDEPENDENT_AMBULATORY_CARE_PROVIDER_SITE_OTHER): Payer: Self-pay | Admitting: Family Medicine

## 2015-08-05 VITALS — BP 104/70 | HR 88 | Temp 98.3°F | Wt 250.0 lb

## 2015-08-05 DIAGNOSIS — Z6835 Body mass index (BMI) 35.0-35.9, adult: Secondary | ICD-10-CM

## 2015-08-05 DIAGNOSIS — I4819 Other persistent atrial fibrillation: Secondary | ICD-10-CM

## 2015-08-05 DIAGNOSIS — I481 Persistent atrial fibrillation: Secondary | ICD-10-CM

## 2015-08-05 DIAGNOSIS — F411 Generalized anxiety disorder: Secondary | ICD-10-CM

## 2015-08-05 MED ORDER — PAROXETINE HCL 20 MG PO TABS: 20.0000 mg | ORAL_TABLET | Freq: Every day | ORAL | Status: DC

## 2015-08-05 NOTE — Patient Instructions (Addendum)
Work on healthy stress relieving strategies. Think about counseling. We will stop celexa and start paxil 20mg  daily - sent to pharmacy. Let us know how you do with this.  See you in January for follow up.

## 2015-08-05 NOTE — Progress Notes (Signed)
Pre visit review using our clinic review tool, if applicable. No additional management support is needed unless otherwise documented below in the visit note. 

## 2015-08-05 NOTE — Assessment & Plan Note (Signed)
Worsening, now with anxiety attack. Celexa has been most effective med to date, but concern with QT prolongation with SSRIs and celexa specifically. Discussed trial of other SSRI (with less but persistent QT prolongation risk) vs trial of SNRI. Pt opts to try paxil - will start 20mg  daily sent to pharmacy.  Offered referral to counsling, declines for now. Re assess 08/2015 at CPE appt. Update sooner if worsening sxs. Pt and GF agree with plan.

## 2015-08-05 NOTE — Progress Notes (Signed)
BP 104/70 mmHg  Pulse 88  Temp(Src) 98.3 F (36.8 C) (Oral)  Wt 250 lb (113.399 kg)   CC: panic attack Subjective:    Patient ID: Stanley Kane, male    DOB: [birth year], 49 y.o.   MRN: [id number]  HPI: OREST DYGERT is a 49 y.o. male presenting on 08/05/2015 for Panic Attack   Presents with GF [name]. Tough year this past year - father died, work stress, car troubles (truck driver), GF just lost job/insurance.   Episode yesterday while driving going out to eat - symptoms included pallor, palpitations with racing heart, felt fear, lost focus. No LOC, chest pain, dizziness, headaches, vision changes, paresthesias or weakness. No confusion/AMS. Noted worsening anxiety.   Seen by Rene Kocher 04/2015 with dx anxiety, started on celexa  daily. zoloft prior not helpful. Seen by Jae Dire last month with worsening anxiety. Felt celexa was helpful but continued stress/anxiety. Night time racing thoughts endorsed. Given h/o afib on flecainide and digoxin, celexa was stopped and lexapro  daily was started. lexapro did not help - rather worsening irritability noted.   Also regularly takes lasix  and xarelto, afib followed by cardiology. Has failed DCCV x2.  Relevant past medical, surgical, family and social history reviewed and updated as indicated. Interim medical history since our last visit reviewed. Allergies and medications reviewed and updated. Current Outpatient Prescriptions on File Prior to Visit  Medication Sig  . digoxin (LANOXIN) 0.25 MG tablet Take 1 tablet (0.25 mg total) by mouth daily.  . flecainide (TAMBOCOR) 100 MG tablet Take 1 tablet (100 mg total) by mouth 2 (two) times daily.  . furosemide (LASIX) 20 MG tablet Take 1 tablet (20 mg total) by mouth daily as needed.  . metoprolol succinate (TOPROL-XL) 25 MG 24 hr tablet Take one tab by mouth once daily  . rivaroxaban (XARELTO) 20 MG TABS tablet Take 1 tablet (20 mg total) by mouth daily with supper.   No current  facility-administered medications on file prior to visit.    Review of Systems Per HPI unless specifically indicated in ROS section     Objective:    BP 104/70 mmHg  Pulse 88  Temp(Src) 98.3 F (36.8 C) (Oral)  Wt 250 lb (113.399 kg)  Wt Readings from Last 3 Encounters:  08/05/15 250 lb (113.399 kg)  07/03/15 247 lb 12.8 oz (112.401 kg)  05/06/15 245 lb (111.131 kg)   Body mass index is 36.9 kg/(m^2).  Physical Exam  Constitutional: He appears well-developed and well-nourished. No distress.  Cardiovascular: Normal rate, normal heart sounds and intact distal pulses.  An irregularly irregular rhythm present.  No murmur heard. Pulmonary/Chest: Effort normal and breath sounds normal. No respiratory distress. He has no wheezes. He has no rales.  Psychiatric: His mood appears anxious. He does not exhibit a depressed mood.  Nursing note and vitals reviewed.     Assessment & Plan:   Problem List Items Addressed This Visit    GAD (generalized anxiety disorder) - Primary    Worsening, now with anxiety attack. Celexa has been most effective med to date, but concern with QT prolongation with SSRIs and celexa specifically. Discussed trial of other SSRI (with less but persistent QT prolongation risk) vs trial of SNRI. Pt opts to try paxil - will start  daily sent to pharmacy.  Offered referral to counsling, declines for now. Re assess 08/2015 at CPE appt. Update sooner if worsening sxs. Pt and GF agree with plan.      Body  mass index (BMI) of 35.0 to 35.9 with comorbidity (HCC)    Pt motivated to return to gym.      Atrial fibrillation (HCC)    Irregular rhythm but no chest pain/tightness. No need for rpt EKG today. Has f/u with cards next month.          Follow up plan: No Follow-up on file.

## 2015-08-05 NOTE — Assessment & Plan Note (Signed)
Irregular rhythm but no chest pain/tightness. No need for rpt EKG today. Has f/u with cards next month.

## 2015-08-05 NOTE — Assessment & Plan Note (Addendum)
Pt motivated to return to gym.

## 2015-08-06 ENCOUNTER — Other Ambulatory Visit: Payer: Self-pay | Admitting: Cardiovascular Disease

## 2015-08-10 ENCOUNTER — Other Ambulatory Visit: Payer: Self-pay | Admitting: Internal Medicine

## 2015-08-17 ENCOUNTER — Ambulatory Visit: Payer: Self-pay | Admitting: Primary Care

## 2015-08-21 ENCOUNTER — Other Ambulatory Visit: Payer: Self-pay | Admitting: Cardiovascular Disease

## 2015-09-04 ENCOUNTER — Telehealth: Payer: Self-pay | Admitting: *Deleted

## 2015-09-04 NOTE — Telephone Encounter (Signed)
Pt wife calling stating they need another BP medication.  For it is getting real expensive for them Please advise.

## 2015-09-04 NOTE — Telephone Encounter (Signed)
Pt will need an appt to discuss med changes; he is over due for a 6 mo f/u and his BP meds also control his HR.

## 2015-09-07 ENCOUNTER — Ambulatory Visit: Payer: Self-pay | Admitting: Cardiovascular Disease

## 2015-09-07 ENCOUNTER — Encounter: Payer: Self-pay | Admitting: Family Medicine

## 2015-09-07 ENCOUNTER — Ambulatory Visit (INDEPENDENT_AMBULATORY_CARE_PROVIDER_SITE_OTHER): Payer: BLUE CROSS/BLUE SHIELD | Admitting: Family Medicine

## 2015-09-07 VITALS — BP 116/82 | HR 76 | Temp 98.0°F | Ht 70.0 in | Wt 255.2 lb

## 2015-09-07 DIAGNOSIS — E781 Pure hyperglyceridemia: Secondary | ICD-10-CM

## 2015-09-07 DIAGNOSIS — I4819 Other persistent atrial fibrillation: Secondary | ICD-10-CM

## 2015-09-07 DIAGNOSIS — Z23 Encounter for immunization: Secondary | ICD-10-CM | POA: Diagnosis not present

## 2015-09-07 DIAGNOSIS — R7303 Prediabetes: Secondary | ICD-10-CM

## 2015-09-07 DIAGNOSIS — Z6835 Body mass index (BMI) 35.0-35.9, adult: Secondary | ICD-10-CM

## 2015-09-07 DIAGNOSIS — Z0001 Encounter for general adult medical examination with abnormal findings: Secondary | ICD-10-CM | POA: Insufficient documentation

## 2015-09-07 DIAGNOSIS — E559 Vitamin D deficiency, unspecified: Secondary | ICD-10-CM

## 2015-09-07 DIAGNOSIS — F411 Generalized anxiety disorder: Secondary | ICD-10-CM | POA: Diagnosis not present

## 2015-09-07 DIAGNOSIS — I481 Persistent atrial fibrillation: Secondary | ICD-10-CM

## 2015-09-07 DIAGNOSIS — Z0000 Encounter for general adult medical examination without abnormal findings: Secondary | ICD-10-CM

## 2015-09-07 NOTE — Assessment & Plan Note (Signed)
Preventative protocols reviewed and updated unless pt declined. Discussed healthy diet and lifestyle.  

## 2015-09-07 NOTE — Assessment & Plan Note (Addendum)
Doing well on paxil 20mg  daily - continue.

## 2015-09-07 NOTE — Assessment & Plan Note (Signed)
Chronic, stable. Continue current regimen. 

## 2015-09-07 NOTE — Progress Notes (Signed)
BP 116/82 mmHg  Pulse 76  Temp(Src) 98 F (36.7 C) (Oral)  Ht 5\' 10"  (1.778 m)  Wt 255 lb 4 oz (115.781 kg)  BMI 36.62 kg/m2   CC: CPE  Subjective:    Patient ID: Stanley Kane, male    DOB: 27-Mar-1966, 50 y.o.   MRN: [id number]  HPI: Stanley Kane is a 50 y.o. male presenting on 09/07/2015 for Annual Exam   Paxil started over last few months for anxiety - doing well.  Ate steak egg and cheese sandwich for breakfast .  Noticing increasing nosebleeds. They stop after 15 min. On xarelto.   Preventative: COLONOSCOPY Date: 2000 WNL, for weight loss after started gym Flu shot - today.  Tdap 2013 Seat belt use discussed Sunscreen use discussed. No changing moles on skin.  Caffeine: 1 cup coffee Lives with GF ([name] Smith), 1 dog Occupation: truck [employer] Edu: 9th grade, GED Activity: bought treadmill - planning on walking 3x/wk Diet: good water, fruits/vegetables daily, some fast foods  Relevant past medical, surgical, family and social history reviewed and updated as indicated. Interim medical history since our last visit reviewed. Allergies and medications reviewed and updated. Current Outpatient Prescriptions on File Prior to Visit  Medication Sig  . digoxin (LANOXIN) 0.25 MG tablet Take 1 tablet (0.25 mg total) by mouth daily.  . flecainide (TAMBOCOR) 100 MG tablet TAKE 1 TABLET (100 MG TOTAL) BY MOUTH 2 (TWO) TIMES DAILY.  . furosemide (LASIX) 20 MG tablet Take 1 tablet (20 mg total) by mouth daily as needed.  . metoprolol succinate (TOPROL-XL) 25 MG 24 hr tablet Take one tab by mouth once daily  . PARoxetine (PAXIL) 20 MG tablet Take 1 tablet (20 mg total) by mouth daily.  . rivaroxaban (XARELTO) 20 MG TABS tablet Take 1 tablet (20 mg total) by mouth daily with supper.   No current facility-administered medications on file prior to visit.    Review of Systems  Constitutional: Negative for fever, chills, activity change, appetite change, fatigue and unexpected  weight change.  HENT: Negative for hearing loss.   Eyes: Positive for visual disturbance (new glasses).  Respiratory: Negative for cough, chest tightness, shortness of breath and wheezing.   Cardiovascular: Negative for chest pain, palpitations and leg swelling.  Gastrointestinal: Negative for nausea, vomiting, abdominal pain, diarrhea, constipation, blood in stool and abdominal distention.  Genitourinary: Negative for hematuria and difficulty urinating.  Musculoskeletal: Negative for myalgias, arthralgias and neck pain.  Skin: Negative for rash.  Neurological: Negative for dizziness, seizures, syncope and headaches.  Hematological: Negative for adenopathy. Does not bruise/bleed easily.  Psychiatric/Behavioral: Negative for dysphoric mood. The patient is not nervous/anxious.    Per HPI unless specifically indicated in ROS section    Objective:    BP 116/82 mmHg  Pulse 76  Temp(Src) 98 F (36.7 C) (Oral)  Ht 5\' 10"  (1.778 m)  Wt 255 lb 4 oz (115.781 kg)  BMI 36.62 kg/m2  Wt Readings from Last 3 Encounters:  09/07/15 255 lb 4 oz (115.781 kg)  08/05/15 250 lb (113.399 kg)  07/03/15 247 lb 12.8 oz (112.401 kg)    Physical Exam  Constitutional: He is oriented to person, place, and time. He appears well-developed and well-nourished. No distress.  HENT:  Head: Normocephalic and atraumatic.  Right Ear: Hearing, tympanic membrane, external ear and ear canal normal.  Left Ear: Hearing, tympanic membrane, external ear and ear canal normal.  Nose: Nose normal.  Mouth/Throat: Uvula is midline, oropharynx is clear  and moist and mucous membranes are normal. No oropharyngeal exudate, posterior oropharyngeal edema or posterior oropharyngeal erythema.  Eyes: Conjunctivae and EOM are normal. Pupils are equal, round, and reactive to light. No scleral icterus.  Neck: Normal range of motion. Neck supple. Carotid bruit is not present. No thyromegaly present.  Cardiovascular: Normal rate, normal heart  sounds and intact distal pulses.  An irregularly irregular rhythm present.  No murmur heard. Pulses:      Radial pulses are 2+ on the right side, and 2+ on the left side.  Pulmonary/Chest: Effort normal and breath sounds normal. No respiratory distress. He has no wheezes. He has no rales.  Abdominal: Soft. Bowel sounds are normal. He exhibits no distension and no mass. There is no tenderness. There is no rebound and no guarding.  Musculoskeletal: Normal range of motion. He exhibits no edema.  Lymphadenopathy:    He has no cervical adenopathy.  Neurological: He is alert and oriented to person, place, and time.  CN grossly intact, station and gait intact  Skin: Skin is warm and dry. No rash noted.  Psychiatric: He has a normal mood and affect. His behavior is normal. Judgment and thought content normal.  Nursing note and vitals reviewed.     Assessment & Plan:   Problem List Items Addressed This Visit    Prediabetes    Check labs.       Relevant Orders   Hemoglobin A1c   Health maintenance examination - Primary    Preventative protocols reviewed and updated unless pt declined. Discussed healthy diet and lifestyle.       GAD (generalized anxiety disorder)    Doing well on paxil 20mg  daily - continue.      Body mass index (BMI) of 35.0 to 35.9 with comorbidity (HCC)    Discussed healthy diet and lifestyle changes to affect sustainable weight loss. Reviewed AHA aerobic exercise recommendations      Relevant Orders   Comprehensive metabolic panel   Atrial fibrillation (HCC)    Chronic, stable. Continue current regimen.      Relevant Orders   Comprehensive metabolic panel   Digoxin level    Other Visit Diagnoses    Vitamin D deficiency        Relevant Orders    VITAMIN D 25 Hydroxy (Vit-D Deficiency, Fractures)    Hypertriglyceridemia        Relevant Orders    Lipid panel    Comprehensive metabolic panel        Follow up plan: Return in about 1 year (around  09/06/2016), or as needed, for annual exam, prior fasting for blood work.

## 2015-09-07 NOTE — Patient Instructions (Addendum)
Flu shot today For nosebleeds - try nasal saline irrigation and vaseline. Let me know if persistent bleeds despite this. Return tomorrow morning fasting for blood work.  Return in 1 year for next physical, sooner if needed. Goal amount of weekly exercise is 16150min/week of moderate intensity aerobic exercise.   Health Maintenance, Male A healthy lifestyle and preventative care can promote health and wellness.  Maintain regular health, dental, and eye exams.  Eat a healthy diet. Foods like vegetables, fruits, whole grains, low-fat dairy products, and lean protein foods contain the nutrients you need and are low in calories. Decrease your intake of foods high in solid fats, added sugars, and salt. Get information about a proper diet from your health care provider, if necessary.  Regular physical exercise is one of the most important things you can do for your health. Most adults should get at least 150 minutes of moderate-intensity exercise (any activity that increases your heart rate and causes you to sweat) each week. In addition, most adults need muscle-strengthening exercises on 2 or more days a week.   Maintain a healthy weight. The body mass index (BMI) is a screening tool to identify possible weight problems. It provides an estimate of body fat based on height and weight. Your health care provider can find your BMI and can help you achieve or maintain a healthy weight. For males 20 years and older:  A BMI below 18.5 is considered underweight.  A BMI of 18.5 to 24.9 is normal.  A BMI of 25 to 29.9 is considered overweight.  A BMI of 30 and above is considered obese.  Maintain normal blood lipids and cholesterol by exercising and minimizing your intake of saturated fat. Eat a balanced diet with plenty of fruits and vegetables. Blood tests for lipids and cholesterol should begin at age 49 and be repeated every 5 years. If your lipid or cholesterol levels are high, you are over age 950, or  you are at high risk for heart disease, you may need your cholesterol levels checked more frequently.Ongoing high lipid and cholesterol levels should be treated with medicines if diet and exercise are not working.  If you smoke, find out from your health care provider how to quit. If you do not use tobacco, do not start.  Lung cancer screening is recommended for adults aged 55-80 years who are at high risk for developing lung cancer because of a history of smoking. A yearly low-dose CT scan of the lungs is recommended for people who have at least a 30-pack-year history of smoking and are current smokers or have quit within the past 15 years. A pack year of smoking is smoking an average of 1 pack of cigarettes a day for 1 year (for example, a 30-pack-year history of smoking could mean smoking 1 pack a day for 30 years or 2 packs a day for 15 years). Yearly screening should continue until the smoker has stopped smoking for at least 15 years. Yearly screening should be stopped for people who develop a health problem that would prevent them from having lung cancer treatment.  If you choose to drink alcohol, do not have more than 2 drinks per day. One drink is considered to be 12 oz (360 mL) of beer, 5 oz (150 mL) of wine, or 1.5 oz (45 mL) of liquor.  Avoid the use of street drugs. Do not share needles with anyone. Ask for help if you need support or instructions about stopping the use of drugs.  High blood pressure causes heart disease and increases the risk of stroke. High blood pressure is more likely to develop in:  People who have blood pressure in the end of the normal range (100-139/85-89 mm Hg).  People who are overweight or obese.  People who are African American.  If you are 66-31 years of age, have your blood pressure checked every 3-5 years. If you are 33 years of age or older, have your blood pressure checked every year. You should have your blood pressure measured twice--once when you  are at a hospital or clinic, and once when you are not at a hospital or clinic. Record the average of the two measurements. To check your blood pressure when you are not at a hospital or clinic, you can use:  An automated blood pressure machine at a pharmacy.  A home blood pressure monitor.  If you are 64-49 years old, ask your health care provider if you should take aspirin to prevent heart disease.  Diabetes screening involves taking a blood sample to check your fasting blood sugar level. This should be done once every 3 years after age 12 if you are at a normal weight and without risk factors for diabetes. Testing should be considered at a younger age or be carried out more frequently if you are overweight and have at least 1 risk factor for diabetes.  Colorectal cancer can be detected and often prevented. Most routine colorectal cancer screening begins at the age of 24 and continues through age 86. However, your health care provider may recommend screening at an earlier age if you have risk factors for colon cancer. On a yearly basis, your health care provider may provide home test kits to check for hidden blood in the stool. A small camera at the end of a tube may be used to directly examine the colon (sigmoidoscopy or colonoscopy) to detect the earliest forms of colorectal cancer. Talk to your health care provider about this at age 9 when routine screening begins. A direct exam of the colon should be repeated every 5-10 years through age 55, unless early forms of precancerous polyps or small growths are found.  People who are at an increased risk for hepatitis B should be screened for this virus. You are considered at high risk for hepatitis B if:  You were born in a country where hepatitis B occurs often. Talk with your health care provider about which countries are considered high risk.  Your parents were born in a high-risk country and you have not received a shot to protect against  hepatitis B (hepatitis B vaccine).  You have HIV or AIDS.  You use needles to inject street drugs.  You live with, or have sex with, someone who has hepatitis B.  You are a man who has sex with other men (MSM).  You get hemodialysis treatment.  You take certain medicines for conditions like cancer, organ transplantation, and autoimmune conditions.  Hepatitis C blood testing is recommended for all people born from [birth year] through 1964 and any individual with known risk factors for hepatitis C.  Healthy men should no longer receive prostate-specific antigen (PSA) blood tests as part of routine cancer screening. Talk to your health care provider about prostate cancer screening.  Testicular cancer screening is not recommended for adolescents or adult males who have no symptoms. Screening includes self-exam, a health care provider exam, and other screening tests. Consult with your health care provider about any symptoms you have or any  concerns you have about testicular cancer.  Practice safe sex. Use condoms and avoid high-risk sexual practices to reduce the spread of sexually transmitted infections (STIs).  You should be screened for STIs, including gonorrhea and chlamydia if:  You are sexually active and are younger than 24 years.  You are older than 24 years, and your health care provider tells you that you are at risk for this type of infection.  Your sexual activity has changed since you were last screened, and you are at an increased risk for chlamydia or gonorrhea. Ask your health care provider if you are at risk.  If you are at risk of being infected with HIV, it is recommended that you take a prescription medicine daily to prevent HIV infection. This is called pre-exposure prophylaxis (PrEP). You are considered at risk if:  You are a man who has sex with other men (MSM).  You are a heterosexual man who is sexually active with multiple partners.  You take drugs by  injection.  You are sexually active with a partner who has HIV.  Talk with your health care provider about whether you are at high risk of being infected with HIV. If you choose to begin PrEP, you should first be tested for HIV. You should then be tested every 3 months for as long as you are taking PrEP.  Use sunscreen. Apply sunscreen liberally and repeatedly throughout the day. You should seek shade when your shadow is shorter than you. Protect yourself by wearing long sleeves, pants, a wide-brimmed hat, and sunglasses year round whenever you are outdoors.  Tell your health care provider of new moles or changes in moles, especially if there is a change in shape or color. Also, tell your health care provider if a mole is larger than the size of a pencil eraser.  A one-time screening for abdominal aortic aneurysm (AAA) and surgical repair of large AAAs by ultrasound is recommended for men aged 29-75 years who are current or former smokers.  Stay current with your vaccines (immunizations).   This information is not intended to replace advice given to you by your health care provider. Make sure you discuss any questions you have with your health care provider.   Document Released: 02/11/2008 Document Revised: 09/05/2014 Document Reviewed: 01/10/2011 Elsevier Interactive Patient Education [employer].

## 2015-09-07 NOTE — Assessment & Plan Note (Signed)
Discussed healthy diet and lifestyle changes to affect sustainable weight loss. Reviewed AHA aerobic exercise recommendations

## 2015-09-07 NOTE — Addendum Note (Signed)
Addended by: Josph MachoANCE, KIMBERLY A on: 09/07/2015 03:00 PM   Modules accepted: Orders

## 2015-09-07 NOTE — Assessment & Plan Note (Signed)
Check labs 

## 2015-09-07 NOTE — Progress Notes (Signed)
Pre visit review using our clinic review tool, if applicable. No additional management support is needed unless otherwise documented below in the visit note. 

## 2015-09-08 ENCOUNTER — Other Ambulatory Visit (INDEPENDENT_AMBULATORY_CARE_PROVIDER_SITE_OTHER): Payer: BLUE CROSS/BLUE SHIELD

## 2015-09-08 ENCOUNTER — Other Ambulatory Visit: Payer: Self-pay

## 2015-09-08 DIAGNOSIS — E781 Pure hyperglyceridemia: Secondary | ICD-10-CM

## 2015-09-08 DIAGNOSIS — R7303 Prediabetes: Secondary | ICD-10-CM | POA: Diagnosis not present

## 2015-09-08 DIAGNOSIS — I481 Persistent atrial fibrillation: Secondary | ICD-10-CM | POA: Diagnosis not present

## 2015-09-08 DIAGNOSIS — Z6835 Body mass index (BMI) 35.0-35.9, adult: Secondary | ICD-10-CM | POA: Diagnosis not present

## 2015-09-08 DIAGNOSIS — E559 Vitamin D deficiency, unspecified: Secondary | ICD-10-CM

## 2015-09-08 DIAGNOSIS — I4819 Other persistent atrial fibrillation: Secondary | ICD-10-CM

## 2015-09-08 LAB — COMPREHENSIVE METABOLIC PANEL
ALT: 26 U/L (ref 0–53)
AST: 22 U/L (ref 0–37)
Albumin: 4.2 g/dL (ref 3.5–5.2)
Alkaline Phosphatase: 52 U/L (ref 39–117)
BILIRUBIN TOTAL: 0.7 mg/dL (ref 0.2–1.2)
BUN: 16 mg/dL (ref 6–23)
CALCIUM: 9.7 mg/dL (ref 8.4–10.5)
CO2: 29 meq/L (ref 19–32)
CREATININE: 1.09 mg/dL (ref 0.40–1.50)
Chloride: 101 mEq/L (ref 96–112)
GFR: 76.45 mL/min (ref >60.00)
GLUCOSE: 81 mg/dL (ref 70–99)
Potassium: 4.9 mEq/L (ref 3.5–5.1)
SODIUM: 138 meq/L (ref 135–145)
Total Protein: 7.6 g/dL (ref 6.0–8.3)

## 2015-09-08 LAB — VITAMIN D 25 HYDROXY (VIT D DEFICIENCY, FRACTURES): VITD: 23.39 ng/mL — ABNORMAL LOW (ref 30.00–100.00)

## 2015-09-08 LAB — LIPID PANEL
CHOL/HDL RATIO: 4
Cholesterol: 177 mg/dL (ref 0–200)
HDL: 49.8 mg/dL (ref >39.00)
LDL Cholesterol: 103 mg/dL — ABNORMAL HIGH (ref 0–99)
NONHDL: 127.39
TRIGLYCERIDES: 124 mg/dL (ref 0.0–149.0)
VLDL: 24.8 mg/dL (ref 0.0–40.0)

## 2015-09-08 LAB — HEMOGLOBIN A1C: Hgb A1c MFr Bld: 5.8 % (ref 4.6–6.5)

## 2015-09-08 NOTE — Addendum Note (Signed)
Addended by: Liane ComberHAVERS, Chai Routh C on: 09/08/2015 11:06 AM   Modules accepted: Orders

## 2015-09-09 LAB — DIGOXIN LEVEL

## 2015-09-12 ENCOUNTER — Other Ambulatory Visit: Payer: Self-pay | Admitting: Family Medicine

## 2015-09-12 MED ORDER — VITAMIN D 50 MCG (2000 UT) PO CAPS: 1.0000 | ORAL_CAPSULE | Freq: Every day | ORAL | Status: DC

## 2015-10-12 ENCOUNTER — Telehealth: Payer: Self-pay | Admitting: Family Medicine

## 2015-10-12 ENCOUNTER — Encounter: Payer: Self-pay | Admitting: Primary Care

## 2015-10-12 ENCOUNTER — Ambulatory Visit (INDEPENDENT_AMBULATORY_CARE_PROVIDER_SITE_OTHER): Payer: BLUE CROSS/BLUE SHIELD | Admitting: Primary Care

## 2015-10-12 VITALS — BP 116/76 | HR 96 | Temp 98.6°F | Ht 70.0 in | Wt 249.8 lb

## 2015-10-12 DIAGNOSIS — K625 Hemorrhage of anus and rectum: Secondary | ICD-10-CM | POA: Diagnosis not present

## 2015-10-12 LAB — COMPREHENSIVE METABOLIC PANEL
ALT: 33 U/L (ref 0–53)
AST: 28 U/L (ref 0–37)
Albumin: 4.4 g/dL (ref 3.5–5.2)
Alkaline Phosphatase: 50 U/L (ref 39–117)
BILIRUBIN TOTAL: 0.5 mg/dL (ref 0.2–1.2)
BUN: 19 mg/dL (ref 6–23)
CO2: 31 meq/L (ref 19–32)
CREATININE: 1.18 mg/dL (ref 0.40–1.50)
Calcium: 9.9 mg/dL (ref 8.4–10.5)
Chloride: 99 mEq/L (ref 96–112)
GFR: 69.74 mL/min (ref >60.00)
GLUCOSE: 87 mg/dL (ref 70–99)
Potassium: 4.6 mEq/L (ref 3.5–5.1)
SODIUM: 137 meq/L (ref 135–145)
Total Protein: 8.3 g/dL (ref 6.0–8.3)

## 2015-10-12 LAB — CBC
HCT: 47.5 % (ref 39.0–52.0)
Hemoglobin: 15.9 g/dL (ref 13.0–17.0)
MCHC: 33.5 g/dL (ref 30.0–36.0)
MCV: 96.9 fl (ref 78.0–100.0)
Platelets: 337 10*3/uL (ref 150.0–400.0)
RBC: 4.91 Mil/uL (ref 4.22–5.81)
RDW: 13.8 % (ref 11.5–15.5)
WBC: 10.4 10*3/uL (ref 4.0–10.5)

## 2015-10-12 LAB — IFOBT (OCCULT BLOOD): IFOBT: NEGATIVE

## 2015-10-12 MED ORDER — HYDROCORTISONE ACETATE 25 MG RE SUPP: 25.0000 mg | Freq: Two times a day (BID) | RECTAL | Status: DC

## 2015-10-12 NOTE — Telephone Encounter (Signed)
Patient Name: SAMBA CUMBA  DOB: 04-08-1966    Initial Comment caller states her boyfriend has blood in his stool   Nurse Assessment  Nurse: Scarlette Ar, RN, Heather Date/Time (Eastern Time): 10/12/2015 9:20:05 AM  Confirm and document reason for call. If symptomatic, describe symptoms. You must click the next button to save text entered. ---Caller states that her boyfriend has had blood in his stool 2 times in the last week and pain in the rectum.  Has the patient traveled out of the country within the last 30 days? ---Not Applicable  Does the patient have any new or worsening symptoms? ---Yes  Will a triage be completed? ---Yes  Related visit to physician within the last 2 weeks? ---No  Does the PT have any chronic conditions? (i.e. diabetes, asthma, etc.) ---Yes  List chronic conditions. ---previous fistula, A-fib, blood thinner  Is this a behavioral health or substance abuse call? ---No     Guidelines    Guideline Title Affirmed Question Affirmed Notes  Rectal Bleeding Taking Coumadin (warfarin) or other strong blood thinner, or known bleeding disorder (e.g., thrombocytopenia)    Final Disposition User   Go to ED Now (or PCP triage) Standifer, RN, Heather    Comments  No appt available until this afternoon. Patient needs to be seen this morning, he is leaving to go out of town at 4 pm this afternoon. He does not want to have to go to the ED, since he has not had a BM in 2 days, so no blood in 2 days. He would like to be worked in to be seen in the office today.  Called the office at [phone] and information given.   Referrals  REFERRED TO PCP OFFICE   Disagree/Comply: Comply

## 2015-10-12 NOTE — Patient Instructions (Signed)
Complete lab work prior to leaving today. I will notify you of your results once received.   Insert Anusol-HC suppository twice daily for 5 days. This should help with the rectal pressure.   Please notify myself or PCP if your bleeding returns.   It was a pleasure meeting you!

## 2015-10-12 NOTE — Progress Notes (Signed)
Pre visit review using our clinic review tool, if applicable. No additional management support is needed unless otherwise documented below in the visit note. 

## 2015-10-12 NOTE — Progress Notes (Signed)
Subjective:    Patient ID: Stanley Kane, male    DOB: [birth year], 50 y.o.   MRN: [id number]  HPI  Stanley Kane is a 50 year old male who presents today with a chief complaint of rectal bleeding.  He is currently managed on Xarelto 20 mg tablets for history of atrial fibrillation. He first noticed the blood in his stool 2 weeks ago. The bleeding is present with every bowel movement. His first incident 2 weeks ago was the worse episode as he noticed bright red blood in the toilet bowl. As the days progressed, the bleeding has been less and is only noticeable on the toilet paper. Over the past 2-3 days, he's not noticed any bleeding. He does continue to experience pressure to his rectum after bowel movements.   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 History of constipation and is currently managed on Miralax daily, which he's been taking for the past 1 week. He also has a history of anal fistula several years ago with the same symptoms.   Denies weakness, fatigue, fevers, chest pain, nausea, vomiting. Overall he's noticed improvement in the bleeding.    Review of Systems  Constitutional: Negative for fever, chills and fatigue.  Respiratory: Negative for shortness of breath.   Cardiovascular: Negative for chest pain.  Gastrointestinal: Positive for constipation, blood in stool and rectal pain. Negative for nausea, vomiting and abdominal pain.  Neurological: Negative for weakness.       Past Medical History  Diagnosis Date  . Arrhythmia     "skips a beat occasionally"  . Obesity   . Heart murmur   . Atrial fibrillation (HCC)   . Prediabetes   . Fatty liver 06/2014    by abd [place]  . 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 Marital Status: Divorced    Spouse Name: N/A  . Number of Children: N/A  . Years of Education: N/A   Occupational History  .  Not on file.   Social History Main Topics  . Smoking status: Former Smoker    Quit date: 08/29/1996  . Smokeless tobacco: Never Used  . Alcohol Use: 1.2 oz/week    2 Cans of beer per week     Comment: Occasional  . Drug Use: No  . Sexual Activity: Yes   Other Topics Concern  . Not on file   Social History Narrative   Caffeine: 1 cup coffee   Lives with GF ([name] Katrinka Blazing), 1 dog   Occupation: truck driver   Edu: 9th grade, GED   Activity: bought treadmill - planning on walking 3x/wk   Diet: good water, fruits/vegetables daily, some fast foods    Past Surgical History  Procedure Laterality Date  . Tonsillectomy and adenoidectomy  childhood  . Cardiovascular stress test  ~2005    thinks at [facility], told normal  . Colonoscopy  2000    WNL, for weight loss after started gym  . US echocardiography  08/2014    EF 60%, nl vent fxn, mildly dilated LA, mild MR, mild TR  . Mri  08/2014    brain - WNL, skull base C1/2 articulation  . Carotid US  08/2014    no plaque    Family History  Problem Relation Age of Onset  . Hypertension Father   . Cancer Father  liver  . Coronary artery disease Father 35  . Heart attack Father   . Diabetes Mother   . Heart disease Mother   . Stroke Neg Hx     Allergies  Allergen Reactions  . Lexapro [Escitalopram Oxalate] Other (See Comments)    Irritability; mood swings; sleep issues  . Vicodin [Hydrocodone-Acetaminophen]     Makes him "jittery"    Current Outpatient Prescriptions on File Prior to Visit  Medication Sig Dispense Refill  . Cholecalciferol (VITAMIN D) 2000 units CAPS Take 1 capsule (2,000 Units total) by mouth daily. 30 capsule   . digoxin (LANOXIN) 0.25 MG tablet Take 1 tablet (0.25 mg total) by mouth daily. 30 tablet 11  . flecainide (TAMBOCOR) 100 MG tablet TAKE 1 TABLET (100 MG TOTAL) BY MOUTH 2 (TWO) TIMES DAILY. 60 tablet 6  . metoprolol succinate (TOPROL-XL) 25 MG 24 hr tablet Take one tab by mouth once daily 30  tablet 6  . PARoxetine (PAXIL) 20 MG tablet Take 1 tablet (20 mg total) by mouth daily. 30 tablet 3  . rivaroxaban (XARELTO) 20 MG TABS tablet Take 1 tablet (20 mg total) by mouth daily with supper. 30 tablet 11  . furosemide (LASIX) 20 MG tablet Take 1 tablet (20 mg total) by mouth daily as needed. (Patient not taking: Reported on 10/12/2015) 30 tablet 11   No current facility-administered medications on file prior to visit.    BP 116/76 mmHg  Pulse 96  Temp(Src) 98.6 F (37 C) (Oral)  Ht  (1.778 m)  Wt 249 lb 12.8 oz (113.309 kg)  BMI 35.84 kg/m2  SpO2 97%    Objective:   Physical Exam  Constitutional: He appears well-nourished.  Cardiovascular: Normal rate and regular rhythm.   Pulmonary/Chest: Effort normal and breath sounds normal.  Abdominal: Soft. Bowel sounds are normal. There is no tenderness.  Negative hemoccult stool card. No hemorrhoids visualized externally or felt internally.  Skin: Skin is warm and dry.          Assessment & Plan:  Rectal Bleeding:  Present for 2 weeks with bowel movements, gradually improved. No bleeding for previous 2-3 days. Hemoccult stool card negative today. No visualization or palpable hemorrhoids. CBC and CMP completely normal. RX for anusol-HC suppositories for rectal fullness sent to pharmacy to use PRN. Discussed importance of breaks while driving for prolonged amount of time. Discussed to notify us if bleeding returns as he's at high risk for acute blood loss due to Xarelto. He verbalized understanding.

## 2015-10-12 NOTE — Telephone Encounter (Signed)
Spoke with [name] (DPR signed); pt saw bright red blood with constipated BM 2 days ago; 2 hours after BM rectum was sore and tender. pt has not had BM since. No abd pain and not vomited blood. Pt is going out of town today at 4 PM. Pt does not want to go to ED; pt scheduled appt today at 2 pm with Mayra Reel NP. [name] said pt has just had BM with no blood in BM; 20 years ago had fistula with similar symptoms at that time. If pt condition changes or worsens prior to appt pt will go to ED. Pt is taking Xarelto.

## 2015-10-15 NOTE — Addendum Note (Signed)
Addended by: Tawnya Crook on: 10/15/2015 07:57 AM   Modules accepted: Orders

## 2015-10-26 ENCOUNTER — Ambulatory Visit (INDEPENDENT_AMBULATORY_CARE_PROVIDER_SITE_OTHER): Payer: BLUE CROSS/BLUE SHIELD | Admitting: Cardiovascular Disease

## 2015-10-26 ENCOUNTER — Encounter: Payer: Self-pay | Admitting: Cardiovascular Disease

## 2015-10-26 VITALS — BP 100/70 | HR 83 | Ht 69.0 in | Wt 250.0 lb

## 2015-10-26 DIAGNOSIS — G4733 Obstructive sleep apnea (adult) (pediatric): Secondary | ICD-10-CM | POA: Diagnosis not present

## 2015-10-26 DIAGNOSIS — Z6835 Body mass index (BMI) 35.0-35.9, adult: Secondary | ICD-10-CM | POA: Diagnosis not present

## 2015-10-26 DIAGNOSIS — I481 Persistent atrial fibrillation: Secondary | ICD-10-CM

## 2015-10-26 DIAGNOSIS — I4819 Other persistent atrial fibrillation: Secondary | ICD-10-CM

## 2015-10-26 NOTE — Assessment & Plan Note (Signed)
Wife reports severe snoring, he has obesity. He is not interested in sleep study at this time

## 2015-10-26 NOTE — Patient Instructions (Signed)
You are doing well. No medication changes were made.  Please call us if you have new issues that need to be addressed before your next appt.  Your physician wants you to follow-up in: 6 months.  You will receive a reminder letter in the mail two months in advance. If you don't receive a letter, please call our office to schedule the follow-up appointment.   

## 2015-10-26 NOTE — Assessment & Plan Note (Signed)
We have encouraged continued exercise, careful diet management in an effort to lose weight. 

## 2015-10-26 NOTE — Progress Notes (Signed)
Patient ID: Stanley Kane, male    DOB: 1966-08-12, 50 y.o.   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  Developing atrial fibrillation in 2013 with cardioversion 07/06/2012, successful restoring of his normal sinus rhythm.  Previous clinic visit with recurrent atrial fibrillation started back on anticoagulation, after several weeks started on flecainide Obesity, Suspected obstructive sleep apnea, not on CPAP . 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 This was done on 11/25/2014. Normal sinus rhythm successfully restored. He was maintained on anticoagulation, flecainide, Lasix daily, metoprolol.  He was scheduled for cardioversion December 2015 and on arrival was in normal sinus rhythm. Prior to that had been started on flecainide. Cardioversion was canceled.  Wife reports he had a period of amnesia after she woke him from a deep sleep. Went to the hospital,  workup was unremarkable. Unable to exclude hypercapnia as he is not on CPAP. He and  his wife are hoping to avoid sleep studies and CPAP His weight has been trending upwards, eating poorly. Not exercising  Significant stress and anxiety when driving his stroke in the [place] area . Wife reports he has anger issues when driving . He  previously went for routine DOT physical and this was denied as he was in atrial fibrillation and not on anticoagulation .   Prior  Echocardiogram showed ejection fraction 50%, mild dilatation of the left atrium, no significant valvular disease  He reports having a stress test at [facility] 6 years ago. This was reportedly normal.  Allergies  Allergen Reactions  . Lexapro [Escitalopram Oxalate] Other (See Comments)    Irritability; mood swings; sleep issues  . Vicodin [Hydrocodone-Acetaminophen]     Makes him "jittery"    Current Outpatient Prescriptions on File Prior to Visit  Medication Sig Dispense Refill  . digoxin (LANOXIN) 0.25 MG tablet Take 1 tablet (0.25 mg total) by mouth daily. 30 tablet 11  . flecainide (TAMBOCOR) 100 MG tablet TAKE 1 TABLET (100 MG TOTAL) BY MOUTH 2 (TWO) TIMES DAILY. 60 tablet 6  . furosemide (LASIX) 20 MG tablet Take 1 tablet (20 mg total) by mouth daily as needed. 30 tablet 11  . metoprolol succinate (TOPROL-XL) 25 MG 24 hr tablet Take one tab by mouth once daily 30 tablet 6  . PARoxetine (PAXIL) 20 MG tablet Take 1 tablet (20 mg total) by mouth daily. 30 tablet 3  . rivaroxaban (XARELTO) 20 MG TABS tablet Take 1 tablet (20 mg total) by mouth daily with supper. 30 tablet 11  No current facility-administered medications on file prior to visit.    Past Medical History  Diagnosis Date  . Arrhythmia     "skips a beat occasionally"  . Obesity   . Heart murmur   . Atrial fibrillation (HCC)   . Prediabetes   . Fatty liver 06/2014    by abd [place]  . 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 Tonsillectomy and adenoidectomy  childhood  . Cardiovascular stress test  ~2005    thinks at [name], told normal  . Colonoscopy  2000    WNL, for weight loss after started gym  . US echocardiography  08/2014    EF 60%, nl vent fxn, mildly dilated LA, mild MR, mild TR  . Mri  08/2014    brain - WNL, skull base C1/2 articulation  . Carotid US  08/2014    no plaque    Social History  reports that he quit smoking about 19 years ago. He has never used smokeless tobacco. He reports that he drinks about 1.2 oz of alcohol per week. He reports that he does not use illicit drugs.  Family History family history includes Cancer in his father; Coronary artery disease (age of onset: 70) in his father; Diabetes in his mother; Heart attack in his father; Heart disease in his mother; Hypertension in his father. There is no history of Stroke.   Review of Systems  Constitutional: Negative.   Respiratory: Negative.   Cardiovascular: Positive for palpitations.  Gastrointestinal: Negative.   Musculoskeletal: Negative.   Neurological: Negative.   Hematological: Negative.   All other systems reviewed and are negative.   BP 100/70 mmHg  Pulse 83  Ht  (1.753 m)  Wt 250 lb (113.399 kg)  BMI 36.90 kg/m2  Physical Exam  Constitutional: He is oriented to person, place, and time. He appears well-developed and well-nourished.  obese  HENT:  Head: Normocephalic.  Nose: Nose normal.  Mouth/Throat: Oropharynx is clear and moist.  Eyes: Conjunctivae are normal. Pupils are equal, round, and reactive to light.  Neck: Normal range of motion. Neck supple. No JVD present.  Cardiovascular: S1 normal, S2 normal, normal heart sounds and intact distal pulses.  An irregularly irregular rhythm present. Exam reveals no gallop and no friction rub.   No murmur heard. Pulmonary/Chest: Effort normal and breath sounds normal. No respiratory  distress. He has no wheezes. He has no rales. He exhibits no tenderness.  Abdominal: Soft. Bowel sounds are normal. He exhibits no distension. There is no tenderness.  Musculoskeletal: Normal range of motion. He exhibits no edema or tenderness.  Lymphadenopathy:    He has no cervical adenopathy.  Neurological: He is alert and oriented to person, place, and time. Coordination normal.  Skin: Skin is warm and dry. No rash noted. No erythema.  Psychiatric: He has a normal mood and affect. His behavior is normal. Judgment and thought content normal.      Assessment and Plan   Nursing note and vitals reviewed.

## 2015-10-26 NOTE — Assessment & Plan Note (Signed)
Persistent atrial fibrillation, adequate rate control, on anticoagulation Not interested in her story normal sinus rhythm

## 2015-10-29 ENCOUNTER — Other Ambulatory Visit: Payer: Self-pay | Admitting: Cardiovascular Disease

## 2015-11-02 ENCOUNTER — Ambulatory Visit: Payer: Self-pay | Admitting: Cardiovascular Disease

## 2015-11-25 ENCOUNTER — Telehealth: Payer: Self-pay | Admitting: Cardiovascular Disease

## 2015-11-25 NOTE — Telephone Encounter (Signed)
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  Please call wife candice when ready for pick up .

## 2015-11-26 NOTE — Telephone Encounter (Signed)
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

## 2015-11-27 NOTE — Telephone Encounter (Signed)
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

## 2015-11-30 ENCOUNTER — Other Ambulatory Visit: Payer: Self-pay | Admitting: Cardiovascular Disease

## 2015-11-30 NOTE — Telephone Encounter (Signed)
Spoke w/ pt's wife.  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 Letter typed and left at the front desk for her to p/u at her convenience.

## 2015-12-02 ENCOUNTER — Other Ambulatory Visit: Payer: Self-pay | Admitting: Family Medicine

## 2015-12-02 ENCOUNTER — Telehealth: Payer: Self-pay | Admitting: Cardiovascular Disease

## 2015-12-02 ENCOUNTER — Other Ambulatory Visit: Payer: Self-pay | Admitting: Cardiovascular Disease

## 2015-12-02 NOTE — Telephone Encounter (Signed)
Please call patient regarding dot clearance and ? sleep apnea noted on chart.

## 2015-12-02 NOTE — Telephone Encounter (Signed)
Left voicemail message for patient to call back.

## 2016-01-15 ENCOUNTER — Ambulatory Visit (INDEPENDENT_AMBULATORY_CARE_PROVIDER_SITE_OTHER): Payer: BLUE CROSS/BLUE SHIELD | Admitting: Family Medicine

## 2016-01-15 ENCOUNTER — Encounter: Payer: Self-pay | Admitting: Family Medicine

## 2016-01-15 VITALS — BP 124/72 | HR 98 | Temp 97.5°F | Wt 253.2 lb

## 2016-01-15 DIAGNOSIS — R1084 Generalized abdominal pain: Secondary | ICD-10-CM

## 2016-01-15 DIAGNOSIS — K760 Fatty (change of) liver, not elsewhere classified: Secondary | ICD-10-CM | POA: Diagnosis not present

## 2016-01-15 NOTE — Progress Notes (Signed)
Pre visit review using our clinic review tool, if applicable. No additional management support is needed unless otherwise documented below in the visit note. 

## 2016-01-15 NOTE — Patient Instructions (Addendum)
Let's check abdominal ultrasound to further evaluate gallbladder and liver.  Pass by Marion's office to set this up.  We will call you with results. In interim, continue miralax, conitnue healthy diet changes, avoid fatty greasy foods, increase water.

## 2016-01-15 NOTE — Progress Notes (Signed)
BP 124/72 mmHg  Pulse 98  Temp(Src) 97.5 F (36.4 C) (Oral)  Wt 253 lb 4 oz (114.873 kg)  SpO2 96%   CC: abd pain  Subjective:    Patient ID: Stanley Kane, male    DOB: 02-08-1966, 50 y.o.   MRN: [id number]  HPI: Stanley Kane is a 49 y.o. male presenting on 01/15/2016 for Abdominal Pain   Presents with wife today.   1+ yr h/o intermittent RUQ discomfort now becoming more constant - trouble getting into a comfortable position. Describes sharp pain alternating with ache. Denies significant GERD. One episode of vomiting after ate chicken and rice. Taking miralax for constipation. Occasional early satiety. Does describe occasional crescendo-decrescendo pressure pain RUQ after fatty meals over hours.   No fevers/chills, nausea/vomiting, diarrhea, dysphagia. No bowel changes. No further blood in stool since 09/2015.   A few nights ago noticed lump at LUQ. Nontender.  fmhx liver cancer (father age 38s - nonalcoholic) Pt with stable abd [place] 2015.   Started working out over last few weeks - walking on treadmill, has total gym. Changing diet, eating healthier. Has cut out soft drinks and breads. More fruits/vegetables daily. No weight loss but noting increased energy.  Relevant past medical, surgical, family and social history reviewed and updated as indicated. Interim medical history since our last visit reviewed. Allergies and medications reviewed and updated. Current Outpatient Prescriptions on File Prior to Visit  Medication Sig  . flecainide (TAMBOCOR) 100 MG tablet TAKE 1 TABLET (100 MG TOTAL) BY MOUTH 2 (TWO) TIMES DAILY.  . furosemide (LASIX) 20 MG tablet TAKE 1 TABLET (20 MG TOTAL) BY MOUTH DAILY AS NEEDED.  . metoprolol succinate (TOPROL-XL) 50 MG 24 hr tablet TAKE 1 TABLET BY MOUTH ONCE A DAY  . PARoxetine (PAXIL) 20 MG tablet TAKE 1 TABLET (20 MG TOTAL) BY MOUTH DAILY.  Marland Kitchen XARELTO 20 MG TABS tablet TAKE 1 TABLET (20 MG TOTAL) BY MOUTH DAILY WITH SUPPER.   No current  facility-administered medications on file prior to visit.    Review of Systems Per HPI unless specifically indicated in ROS section     Objective:    BP 124/72 mmHg  Pulse 98  Temp(Src) 97.5 F (36.4 C) (Oral)  Wt 253 lb 4 oz (114.873 kg)  SpO2 96%  Wt Readings from Last 3 Encounters:  01/15/16 253 lb 4 oz (114.873 kg)  10/26/15 250 lb (113.399 kg)  10/12/15 249 lb 12.8 oz (113.309 kg)    Physical Exam  Constitutional: He appears well-developed and well-nourished. No distress.  HENT:  Mouth/Throat: Oropharynx is clear and moist. No oropharyngeal exudate.  Cardiovascular: Normal rate, regular rhythm, normal heart sounds and intact distal pulses.   No murmur heard. Pulmonary/Chest: Effort normal and breath sounds normal. No respiratory distress. He has no wheezes. He has no rales.  Abdominal: Soft. Normal appearance and bowel sounds are normal. He exhibits no distension and no mass. There is no hepatosplenomegaly. There is tenderness in the right upper quadrant and right lower quadrant. There is no rigidity, no rebound, no guarding, no CVA tenderness and negative Murphy's sign.  Obese abdomen  Musculoskeletal: He exhibits no edema.  Skin: Skin is warm and dry.  Psychiatric: He has a normal mood and affect.  Nursing note and vitals reviewed.  Results for orders placed or performed in visit on 10/12/15  CBC  Result Value Ref Range   WBC 10.4 4.0 - 10.5 K/uL   RBC 4.91 4.22 - 5.81 Mil/uL  Platelets 337.0 150.0 - 400.0 K/uL   Hemoglobin 15.9 13.0 - 17.0 g/dL   HCT 04.547.5 40.939.0 - 81.152.0 %   MCV 96.9 78.0 - 100.0 fl   MCHC 33.5 30.0 - 36.0 g/dL   RDW 91.413.8 78.211.5 - 95.615.5 %  Comprehensive metabolic panel  Result Value Ref Range   Sodium 137 135 - 145 mEq/L   Potassium 4.6 3.5 - 5.1 mEq/L   Chloride 99 96 - 112 mEq/L   CO2 31 19 - 32 mEq/L   Glucose, Bld 87 70 - 99 mg/dL   BUN 19 6 - 23 mg/dL   Creatinine, Ser 2.131.18 0.40 - 1.50 mg/dL   Total Bilirubin 0.5 0.2 - 1.2 mg/dL    Alkaline Phosphatase 50 39 - 117 U/L   AST 28 0 - 37 U/L   ALT 33 0 - 53 U/L   Total Protein 8.3 6.0 - 8.3 g/dL   Albumin 4.4 3.5 - 5.2 g/dL   Calcium 9.9 8.4 - 08.610.5 mg/dL   GFR 57.8469.74 >69.62>60.00 mL/min  IFOBT POC (occult bld, rslt in office)  Result Value Ref Range   IFOBT Negative       Assessment & Plan:   Problem List Items Addressed This Visit    Generalized abdominal pain - Primary    Predominantly RUQ as well as newer RLQ abdominal discomfort. Reviewed recent normal labwork. ?biliary colic description - will recheck abd US, consider HIDA scan vs CT scan for further eval if US unrevealing. Consider rpt CMP and check lipase if unrevealing abd US. Pt and wife agree with plan.       Relevant Orders   US Abdomen Complete   Fatty liver       Follow up plan: Return if symptoms worsen or fail to improve.  Eustaquio BoydenJavier Myldred Raju, MD

## 2016-01-16 NOTE — Assessment & Plan Note (Deleted)
Predominantly RUQ as well as newer RLQ abdominal discomfort. Reviewed recent normal labwork. ?biliary colic description - will recheck abd US, consider HIDA scan vs CT scan for further eval if US unrevealing. Consider rpt CMP and check lipase if unrevealing abd US. Pt and wife agree with plan.  

## 2016-01-16 NOTE — Assessment & Plan Note (Signed)
Predominantly RUQ as well as newer RLQ abdominal discomfort. Reviewed recent normal labwork. ?biliary colic description - will recheck abd US, consider HIDA scan vs CT scan for further eval if US unrevealing. Consider rpt CMP and check lipase if unrevealing abd US. Pt and wife agree with plan.

## 2016-01-22 ENCOUNTER — Ambulatory Visit
Admission: RE | Admit: 2016-01-22 | Discharge: 2016-01-22 | Disposition: A | Discharge status: Home / Self Care | Payer: BLUE CROSS/BLUE SHIELD | Source: Ambulatory Visit | Attending: Family Medicine | Admitting: Family Medicine

## 2016-01-22 DIAGNOSIS — R1084 Generalized abdominal pain: Secondary | ICD-10-CM | POA: Diagnosis present

## 2016-01-22 DIAGNOSIS — K760 Fatty (change of) liver, not elsewhere classified: Secondary | ICD-10-CM | POA: Diagnosis not present

## 2016-01-28 ENCOUNTER — Telehealth: Payer: Self-pay | Admitting: Family Medicine

## 2016-01-28 ENCOUNTER — Other Ambulatory Visit: Payer: Self-pay | Admitting: Family Medicine

## 2016-01-28 DIAGNOSIS — K760 Fatty (change of) liver, not elsewhere classified: Secondary | ICD-10-CM

## 2016-01-28 DIAGNOSIS — R1084 Generalized abdominal pain: Secondary | ICD-10-CM

## 2016-01-28 NOTE — Telephone Encounter (Signed)
[name] called back - please call back, says you have the best number  Thank you

## 2016-01-28 NOTE — Telephone Encounter (Signed)
Stanley Kane called wanting medical assistant  To call her back about ultra sound results.

## 2016-01-28 NOTE — Telephone Encounter (Signed)
Spoke with [name].

## 2016-02-01 ENCOUNTER — Other Ambulatory Visit (INDEPENDENT_AMBULATORY_CARE_PROVIDER_SITE_OTHER): Payer: BLUE CROSS/BLUE SHIELD

## 2016-02-01 ENCOUNTER — Other Ambulatory Visit: Payer: Self-pay

## 2016-02-01 DIAGNOSIS — K760 Fatty (change of) liver, not elsewhere classified: Secondary | ICD-10-CM

## 2016-02-01 DIAGNOSIS — R1084 Generalized abdominal pain: Secondary | ICD-10-CM

## 2016-02-01 LAB — CBC WITH DIFFERENTIAL/PLATELET
BASOS ABS: 0.1 10*3/uL (ref 0.0–0.1)
Basophils Relative: 1.1 % (ref 0.0–3.0)
EOS ABS: 0.4 10*3/uL (ref 0.0–0.7)
Eosinophils Relative: 5.3 % — ABNORMAL HIGH (ref 0.0–5.0)
HCT: 45.9 % (ref 39.0–52.0)
Hemoglobin: 15.6 g/dL (ref 13.0–17.0)
LYMPHS ABS: 2.8 10*3/uL (ref 0.7–4.0)
Lymphocytes Relative: 36.5 % (ref 12.0–46.0)
MCHC: 34 g/dL (ref 30.0–36.0)
MCV: 96.2 fl (ref 78.0–100.0)
Monocytes Absolute: 0.9 10*3/uL (ref 0.1–1.0)
Monocytes Relative: 11.3 % (ref 3.0–12.0)
NEUTROS ABS: 3.6 10*3/uL (ref 1.4–7.7)
NEUTROS PCT: 45.8 % (ref 43.0–77.0)
PLATELETS: 303 10*3/uL (ref 150.0–400.0)
RBC: 4.77 Mil/uL (ref 4.22–5.81)
RDW: 13.5 % (ref 11.5–15.5)
WBC: 7.8 10*3/uL (ref 4.0–10.5)

## 2016-02-01 LAB — COMPREHENSIVE METABOLIC PANEL
ALT: 28 U/L (ref 0–53)
AST: 25 U/L (ref 0–37)
Albumin: 4.2 g/dL (ref 3.5–5.2)
Alkaline Phosphatase: 46 U/L (ref 39–117)
BILIRUBIN TOTAL: 0.4 mg/dL (ref 0.2–1.2)
BUN: 20 mg/dL (ref 6–23)
CALCIUM: 9.6 mg/dL (ref 8.4–10.5)
CO2: 30 meq/L (ref 19–32)
CREATININE: 1.05 mg/dL (ref 0.40–1.50)
Chloride: 101 mEq/L (ref 96–112)
GFR: 79.69 mL/min (ref >60.00)
GLUCOSE: 102 mg/dL — AB (ref 70–99)
Potassium: 4.4 mEq/L (ref 3.5–5.1)
Sodium: 137 mEq/L (ref 135–145)
TOTAL PROTEIN: 7.5 g/dL (ref 6.0–8.3)

## 2016-02-01 LAB — LIPASE: LIPASE: 35 U/L (ref 11.0–59.0)

## 2016-02-03 ENCOUNTER — Other Ambulatory Visit: Payer: Self-pay | Admitting: Family Medicine

## 2016-02-03 DIAGNOSIS — R1084 Generalized abdominal pain: Secondary | ICD-10-CM

## 2016-02-12 ENCOUNTER — Telehealth: Payer: Self-pay

## 2016-02-12 NOTE — Telephone Encounter (Signed)
[name] (DPR signed) said when pt first started Paxil 20 mg taking one daily it helped with anxiousness but for the past month pt is more anxious, nervous and gets upset easily; it is like the pt cannot relax his mind. Pt is worried about his stomach condition and work issues. [name] wants to know if paxil could be increased or another med for anxiety added. [name] request cb. [facility].

## 2016-02-15 ENCOUNTER — Ambulatory Visit (HOSPITAL_COMMUNITY)
Admission: RE | Admit: 2016-02-15 | Discharge: 2016-02-15 | Disposition: A | Discharge status: Home / Self Care | Payer: BLUE CROSS/BLUE SHIELD | Source: Ambulatory Visit | Attending: Family Medicine | Admitting: Family Medicine

## 2016-02-15 DIAGNOSIS — R1084 Generalized abdominal pain: Secondary | ICD-10-CM

## 2016-02-15 MED ORDER — TECHNETIUM TC 99M MEBROFENIN IV KIT
4.8500 | PACK | Freq: Once | INTRAVENOUS | Status: AC | PRN
  Administered 2016-02-15: 4.85 via INTRAVENOUS

## 2016-02-15 MED ORDER — PAROXETINE HCL 40 MG PO TABS: 40.0000 mg | ORAL_TABLET | Freq: Every day | ORAL | Status: DC

## 2016-02-15 NOTE — Telephone Encounter (Signed)
[name] notified and verbalized understanding.

## 2016-02-15 NOTE — Telephone Encounter (Signed)
Let's increase paxil - new dose sent to pharmacy. Update with effect after 3-4 wks.  Watch for any worsening of palpitations with higher paxil dose.

## 2016-02-17 ENCOUNTER — Telehealth: Payer: Self-pay | Admitting: Family Medicine

## 2016-02-17 DIAGNOSIS — K760 Fatty (change of) liver, not elsewhere classified: Secondary | ICD-10-CM

## 2016-02-17 DIAGNOSIS — R1084 Generalized abdominal pain: Secondary | ICD-10-CM

## 2016-02-17 NOTE — Telephone Encounter (Signed)
Patient's girlfriend,[name],called.  She asked to be called back instead of patient w/ test results.  [place] way [place]

## 2016-02-19 ENCOUNTER — Telehealth: Payer: Self-pay

## 2016-02-19 MED ORDER — PROMETHAZINE HCL 25 MG PO TABS: 12.5000 mg | ORAL_TABLET | Freq: Three times a day (TID) | ORAL | Status: DC | PRN

## 2016-02-19 NOTE — Telephone Encounter (Signed)
Stanley Kane (DPR signed) left v/m; pt waiting on GI referral for GB issue. Pt is having a lot of nausea and [facility] Dr Reece AgarG out of office.Please advise.

## 2016-02-19 NOTE — Telephone Encounter (Signed)
I sent phenergan to his pharmacy  If symptoms suddenly worsen-go to ED This medication may cause drowsiness -use with caution  Will cc PCP

## 2016-02-19 NOTE — Telephone Encounter (Signed)
Released via mychart. plz notify GF gallbladder study returned showing low normal gallbladder function but still normal range. If ongoing discomfort would offer GI referral for further evaluation.

## 2016-02-19 NOTE — Telephone Encounter (Signed)
Patient's GF notified and verbalized understanding.

## 2016-02-19 NOTE — Telephone Encounter (Signed)
Lockie ParesKandance called wanting to get a referral to see  [place]  [place]

## 2016-02-19 NOTE — Telephone Encounter (Signed)
Message left advising patient's GF. Advised to call if he decides to go to GI or if there are any questions.

## 2016-02-21 NOTE — Telephone Encounter (Signed)
Referral placed.

## 2016-02-21 NOTE — Addendum Note (Signed)
Addended by: Eustaquio BoydenGUTIERREZ, Franceen Erisman on: 02/21/2016 06:40 PM   Modules accepted: Orders

## 2016-02-22 NOTE — Telephone Encounter (Signed)
LVM for Stanley Kane to call me back  Revonda StandardAllison

## 2016-03-25 ENCOUNTER — Other Ambulatory Visit: Payer: Self-pay | Admitting: Cardiovascular Disease

## 2016-04-27 ENCOUNTER — Ambulatory Visit (INDEPENDENT_AMBULATORY_CARE_PROVIDER_SITE_OTHER): Payer: BLUE CROSS/BLUE SHIELD | Admitting: Gastroenterology

## 2016-04-27 ENCOUNTER — Encounter: Payer: Self-pay | Admitting: Gastroenterology

## 2016-04-27 VITALS — BP 107/67 | HR 75 | Temp 97.7°F | Ht 69.0 in | Wt 255.0 lb

## 2016-04-27 DIAGNOSIS — R1012 Left upper quadrant pain: Secondary | ICD-10-CM

## 2016-04-27 DIAGNOSIS — R1011 Right upper quadrant pain: Secondary | ICD-10-CM

## 2016-04-27 NOTE — Progress Notes (Signed)
Gastroenterology Consultation  Referring Provider:     [name], MD Primary Care Physician:  [name], MD Primary Gastroenterologist:  Dr. Servando Snare     Reason for Consultation:     Abdominal pain        HPI:   Stanley Kane is a 50 y.o. y/o male referred for consultation & management of Abdominal pain by Dr. [name], MD.  This patient comes today with a report of abdominal pain.  The patient states his abdominal pain is in the right and left upper abdomen.  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  The patient had a ultrasound with a gallbladder emptying study with both being normal except for fatty liver.The patient denies any unexplained weight loss fevers chills nausea or vomiting.  The patient does report that his father had cirrhosis from an unknown cause.  He does not report any food consistently making the pain any better or worse.  The patient finds himself changing positions to try to improve the abdominal pain.  Past Medical History:  Diagnosis Date  . Arrhythmia    "skips a beat occasionally"  . Atrial fibrillation (HCC)   . Fatty liver 06/2014   by abd [place]  . Heart murmur   . Obesity   . Prediabetes   . 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 CARDIOVASCULAR STRESS TEST  ~2005   thinks at [facility], told normal  . carotid US  08/2014   no plaque  . COLONOSCOPY  2000   WNL, for weight loss after started gym  . MRI  08/2014   brain - WNL, skull base C1/2 articulation  . TONSILLECTOMY AND ADENOIDECTOMY  childhood  . US ECHOCARDIOGRAPHY  08/2014   EF 60%, nl vent fxn, mildly dilated LA, mild MR, mild TR    Prior to Admission medications   Medication Sig Start Date End Date Taking? Authorizing Provider  furosemide (LASIX) 20 MG tablet TAKE 1 TABLET (20 MG TOTAL) BY  MOUTH DAILY AS NEEDED. 11/30/15  Yes Antonieta Iba, MD  metoprolol succinate (TOPROL-XL) 50 MG 24 hr tablet TAKE 1 TABLET BY MOUTH ONCE A DAY 10/29/15  Yes Antonieta Iba, MD  PARoxetine (PAXIL) 40 MG tablet Take 1 tablet (40 mg total) by mouth daily. 02/15/16  Yes [name], MD  XARELTO 20 MG TABS tablet TAKE 1 TABLET (20 MG TOTAL) BY MOUTH DAILY WITH SUPPER. 12/02/15  Yes Antonieta Iba, MD  flecainide (TAMBOCOR) 100 MG tablet TAKE 1 TABLET (100 MG TOTAL) BY MOUTH 2 (TWO) TIMES DAILY. Patient not taking: Reported on 04/27/2016 03/25/16   Iran Ouch, MD  promethazine (PHENERGAN) 25 MG tablet Take 0.5-1 tablets (12.5-25 mg total) by mouth every 8 (eight) hours as needed for nausea or vomiting (caution of sedation). Patient not taking: Reported on 04/27/2016 02/19/16   Judy Pimple, MD    Family History  Problem Relation Age of Onset  . Hypertension Father   . Cancer Father 36    liver  . Coronary artery disease Father 35  . Heart attack Father   . Diabetes Mother   . Heart disease Mother   . Stroke Neg Hx      Social History  Substance Use Topics  . Smoking status: Former Smoker  Quit date: 08/29/1996  . Smokeless tobacco: Never Used  . Alcohol use 1.2 oz/week    2 Cans of beer per week     Comment: Occasional    Allergies as of 04/27/2016 - Review Complete 04/27/2016  Allergen Reaction Noted  . Lexapro [escitalopram oxalate] Other (See Comments) 08/05/2015  . Vicodin [hydrocodone-acetaminophen]  04/09/2012    Review of Systems:    All systems reviewed and negative except where noted in HPI.   Physical Exam:  BP 107/67   Pulse 75   Temp 97.7 F (36.5 C) (Oral)   Ht 5\' 9"  (1.753 m)   Wt 255 lb (115.7 kg)   BMI 37.66 kg/m  No LMP for male patient. Psych:  Alert and cooperative. Normal mood and affect. General:   Alert,  Well-developed, well-nourished, pleasant and cooperative in NAD Head:  Normocephalic and atraumatic. Eyes:  Sclera clear, no icterus.    Conjunctiva pink. Ears:  Normal auditory acuity. Nose:  No deformity, discharge, or lesions. Mouth:  No deformity or lesions,oropharynx pink & moist. Neck:  Supple; no masses or thyromegaly. Lungs:  Respirations even and unlabored.  Clear throughout to auscultation.   No wheezes, crackles, or rhonchi. No acute distress. Heart:  Regular rate and rhythm; no murmurs, clicks, rubs, or gallops. Abdomen:  Normal bowel sounds.  No bruits.  Soft, Positive tenderness to one finger palpation while flexing the abdominal wall muscles. Non-distended without masses, hepatosplenomegaly or hernias noted.  No guarding or rebound tenderness.  Positive Carnett sign.   Rectal:  Deferred.  Msk:  Symmetrical without gross deformities.  Good, equal movement & strength bilaterally. Pulses:  Normal pulses noted. Extremities:  No clubbing or edema.  No cyanosis. Neurologic:  Alert and oriented x3;  grossly normal neurologically. Skin:  Intact without significant lesions or rashes.  No jaundice. Lymph Nodes:  No significant cervical adenopathy. Psych:  Alert and cooperative. Normal mood and affect.  Imaging Studies: No results found.  Assessment and Plan:   Stanley Kane is a 50 y.o. y/o male Who comes today with a report of abdominal pain that is more related to position that it is too anything else.  The patient's exam was consistent with abdominal wall pain with reproducing the abdominal pain while lifting the patient's legs 6 inches above the exam table.  The patient's abdominal pain was then exacerbated by lightly palpating the abdominal wall.  The patient has been told to use warm compresses on the area and take NSAIDs to decrease the inflammation.  He has been told to take the NSAIDs with food and for 2 weeks.  The patient has been explained the plan and agrees with it.   Note: This dictation was prepared with Dragon dictation along with smaller phrase technology. Any transcriptional errors that result from  this process are unintentional.

## 2016-05-26 ENCOUNTER — Telehealth: Payer: Self-pay

## 2016-05-26 ENCOUNTER — Other Ambulatory Visit: Payer: Self-pay

## 2016-05-26 ENCOUNTER — Telehealth: Payer: Self-pay | Admitting: Cardiovascular Disease

## 2016-05-26 DIAGNOSIS — R140 Abdominal distension (gaseous): Secondary | ICD-10-CM

## 2016-05-26 DIAGNOSIS — K625 Hemorrhage of anus and rectum: Secondary | ICD-10-CM

## 2016-05-26 NOTE — Telephone Encounter (Signed)
Pt scheduled for  CT scan of abdomen/pelvis on Monday, Oct 2nd @ 9:00am. Left vm with Jeremy Johann, pt's girlfriend with this information. She will need to pick up prep kit at the outpatient imaging. Asked her to return my call to confirm she received.

## 2016-05-26 NOTE — Telephone Encounter (Signed)
Pt currently on Xarelto 20 mg daily. He requests to hold this 2/2 possible GI bleed. GI is deferring this decision to Dr. Mariah MillingGollan.

## 2016-05-26 NOTE — Telephone Encounter (Signed)
Pt girlfriend calling asking if we can possibly take patient off his blood thinner He has had blood in stool for the last couple weeks Called gastro doctor and they were told to call us Pt is going to be home Monday Tuesday  Please advise.

## 2016-05-29 NOTE — Telephone Encounter (Signed)
He had chronic atrial fib, Risk of CVA if he comes off anticoagulation Bleeding is not normal Would recommend discussion with PMD and GI about having a colonoscopy Would make sure BMs soft as he could have hemorrhoids

## 2016-05-30 ENCOUNTER — Ambulatory Visit
Admission: RE | Admit: 2016-05-30 | Discharge: 2016-05-30 | Disposition: A | Discharge status: Home / Self Care | Payer: BLUE CROSS/BLUE SHIELD | Source: Ambulatory Visit | Attending: Gastroenterology | Admitting: Gastroenterology

## 2016-05-30 ENCOUNTER — Other Ambulatory Visit: Payer: Self-pay | Admitting: Cardiovascular Disease

## 2016-05-30 DIAGNOSIS — R140 Abdominal distension (gaseous): Secondary | ICD-10-CM

## 2016-05-30 DIAGNOSIS — K625 Hemorrhage of anus and rectum: Secondary | ICD-10-CM | POA: Diagnosis not present

## 2016-05-30 MED ORDER — IOPAMIDOL (ISOVUE-300) INJECTION 61%
100.0000 mL | Freq: Once | INTRAVENOUS | Status: AC | PRN
  Administered 2016-05-30: 100 mL via INTRAVENOUS

## 2016-05-30 MED ORDER — IOPAMIDOL (ISOVUE-M 300) INJECTION 61%: 15.0000 mL | Freq: Once | INTRAMUSCULAR | Status: DC | PRN

## 2016-05-30 NOTE — Telephone Encounter (Signed)
Spoke w/ [name].  She reports that they are at Dr. Annabell SabalWohl's office now, pt may have an abdominal CT. She reports that pt has had a considerable amount of bright red blood in the toilet. They will contact our office if pt needs cardiac clearance to hold Xarelto prior to any procedures. Pt is overdue for 6 mo f/u.  Shed pt to see Dr. Mariah MillingGollan 06/27/16 @ 8:00.

## 2016-05-30 NOTE — Telephone Encounter (Signed)
Left message for [name] to call back °

## 2016-06-01 ENCOUNTER — Telehealth: Payer: Self-pay

## 2016-06-01 NOTE — Telephone Encounter (Signed)
Pt notified of CT scan results. Pt stated the rectal bleeding stopped on Saturday but still has some upper abdominal pain. Will continue to monitor pain. If this increases or bleeding returns, pt will call back to schedule a follow up appt.

## 2016-06-01 NOTE — Telephone Encounter (Signed)
-----   Message from Midge Miniumarren Wohl, MD sent at 05/30/2016 12:58 PM EDT ----- The CT scan of the abdomen did not show any acute findings to explain any of his symptoms. The CT did not show any distention of any of his abdominal organs.

## 2016-06-27 ENCOUNTER — Ambulatory Visit: Payer: Self-pay | Admitting: Cardiovascular Disease

## 2016-07-04 ENCOUNTER — Other Ambulatory Visit: Payer: Self-pay | Admitting: Cardiovascular Disease

## 2016-07-04 NOTE — Telephone Encounter (Signed)
CALLED PT TO CHECK ON HOW HE WAS TAKING HIS LASIX AS IT IS PRN AND WAS FILLED IN OCT, OT ADMITTED TO TAKING QD BC HE DIDNT KNOW IT WAS A PRN, HE ALSO STATED HIS SWELLING WAS BETTER DOING THIS.  WILL FORWARD TO [facility] FOR REVIEW AND TO SEE IF PT WILL NEED BLOOD WORK AT NEXT APPT AS HE IS NOT ON K+

## 2016-07-08 ENCOUNTER — Other Ambulatory Visit: Payer: Self-pay | Admitting: *Deleted

## 2016-07-08 MED ORDER — RIVAROXABAN 20 MG PO TABS: ORAL_TABLET | ORAL | 3 refills | Status: DC

## 2016-07-18 ENCOUNTER — Ambulatory Visit: Payer: Self-pay | Admitting: Cardiovascular Disease

## 2016-07-25 ENCOUNTER — Ambulatory Visit (INDEPENDENT_AMBULATORY_CARE_PROVIDER_SITE_OTHER): Payer: BLUE CROSS/BLUE SHIELD | Admitting: Cardiovascular Disease

## 2016-07-25 ENCOUNTER — Ambulatory Visit: Payer: Self-pay | Admitting: Cardiovascular Disease

## 2016-07-25 ENCOUNTER — Encounter: Payer: Self-pay | Admitting: Cardiovascular Disease

## 2016-07-25 VITALS — BP 120/88 | HR 84 | Ht 69.0 in | Wt 252.5 lb

## 2016-07-25 DIAGNOSIS — N529 Male erectile dysfunction, unspecified: Secondary | ICD-10-CM

## 2016-07-25 DIAGNOSIS — I481 Persistent atrial fibrillation: Secondary | ICD-10-CM | POA: Diagnosis not present

## 2016-07-25 DIAGNOSIS — R0602 Shortness of breath: Secondary | ICD-10-CM | POA: Diagnosis not present

## 2016-07-25 DIAGNOSIS — I4819 Other persistent atrial fibrillation: Secondary | ICD-10-CM

## 2016-07-25 DIAGNOSIS — R5383 Other fatigue: Secondary | ICD-10-CM | POA: Diagnosis not present

## 2016-07-25 DIAGNOSIS — Z6835 Body mass index (BMI) 35.0-35.9, adult: Secondary | ICD-10-CM

## 2016-07-25 DIAGNOSIS — R0683 Snoring: Secondary | ICD-10-CM

## 2016-07-25 MED ORDER — SILDENAFIL CITRATE 20 MG PO TABS: 20.0000 mg | ORAL_TABLET | Freq: Three times a day (TID) | ORAL | 3 refills | Status: DC | PRN

## 2016-07-25 MED ORDER — FLECAINIDE ACETATE 100 MG PO TABS: 100.0000 mg | ORAL_TABLET | Freq: Two times a day (BID) | ORAL | 11 refills | Status: DC

## 2016-07-25 MED ORDER — METOPROLOL SUCCINATE ER 50 MG PO TB24: 50.0000 mg | ORAL_TABLET | Freq: Every day | ORAL | 11 refills | Status: DC

## 2016-07-25 MED ORDER — RIVAROXABAN 20 MG PO TABS: ORAL_TABLET | ORAL | 11 refills | Status: DC

## 2016-07-25 MED ORDER — FUROSEMIDE 20 MG PO TABS: 20.0000 mg | ORAL_TABLET | Freq: Every day | ORAL | 11 refills | Status: DC

## 2016-07-25 NOTE — Patient Instructions (Signed)
Medication Instructions:   Try the revatio 3 to 5 pills as needed  Labwork:  No new labs needed  Testing/Procedures:  No further testing at this time   I recommend watching educational videos on topics of interest to you at:       [email or web address]  Enter code: [email or web address]    Follow-Up: It was a pleasure seeing you in the office today. Please call us if you have new issues that need to be addressed before your next appt.  [phone]  Your physician wants you to follow-up in: 6 months.  You will receive a reminder letter in the mail two months in advance. If you don't receive a letter, please call our office to schedule the follow-up appointment.  If you need a refill on your cardiac medications before your next appointment, please call your pharmacy.

## 2016-07-25 NOTE — Progress Notes (Signed)
Cardiology Office Note  Date:  07/25/2016   ID:  Stanley Kane, DOB 1965-09-07, MRN [id number]  PCP:  [name], MD   Chief Complaint  Patient presents with  . other    6 month follow up. Meds reviewed by the pt. verbally. 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  Developing atrial fibrillation in 2013 with cardioversion 07/06/2012, successful restoring of his normal sinus rhythm.  Previous clinic visit with recurrent atrial fibrillation started back on anticoagulation, after several weeks started on flecainide Obesity, Suspected obstructive sleep apnea, not on CPAP . Sleep apnea noted during anesthesia of previous cardioversion   In follow-up today he reports that he is doing well Feels very tired, Goes to sleep 10 and 6 Snoring, dog wakes him up during the nighttime Takes lasix everyday, BMP normal Erectile dysfunction issues,  interested in medication No regular exercise program Not interested in sleep study at this time, will call us if he changes his mind Weight elevated but stable Tolerating anticoagulation without any significant symptoms  In the past, did not want to restore normal sinus rhythm as he was feeling well He continues to drive a truck  EKG on today's visit shows atrial fibrillation with ventricular rate 84 bpm, no significant ST or T-wave changes  Other past medical history On a earlier visit in 2016, he hasn't taking his anticoagulation and flecainide 150 mg twice a day, he had converted back to atrial fibrillation, was relatively asymptomatic He underwent cardioversion as it felt it was affecting him in some ways, such as his sex life. This was done on 11/25/2014. Normal sinus rhythm successfully restored. He was maintained on anticoagulation, flecainide, Lasix daily,  metoprolol.  He was scheduled for cardioversion December 2015 and on arrival was in normal sinus rhythm. Prior to that had been started on flecainide. Cardioversion was canceled.  Wife reports he had a period of amnesia after she woke him from a deep sleep. Went to the hospital, workup was unremarkable. Unable to exclude hypercapnia as he is not on CPAP. He and  his wife are hoping to avoid sleep studies and CPAP His weight has been trending upwards, eating poorly. Not exercising  Significant stress and anxiety when driving his stroke in the [place] area . Wife reports he has anger issues when driving . He  previously went for routine DOT physical and this was denied as he was in atrial fibrillation and not on anticoagulation .   Prior  Echocardiogram showed ejection fraction 50%, mild dilatation of the left atrium, no significant valvular disease  He reports having a stress test at [facility] 6 years ago. This was reportedly normal.  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  PSH:    Past Surgical History:  Procedure Laterality Date  . CARDIOVASCULAR STRESS TEST  ~2005   thinks at [facility], told normal  . carotid US  08/2014   no plaque  . COLONOSCOPY  2000   WNL, for weight loss after started gym  . MRI  08/2014   brain - WNL, skull base C1/2 articulation  . TONSILLECTOMY AND ADENOIDECTOMY  childhood  . US ECHOCARDIOGRAPHY  08/2014   EF 60%, nl vent fxn, mildly dilated LA, mild MR, mild TR  Current Outpatient Prescriptions  Medication Sig Dispense Refill  . flecainide (TAMBOCOR) 100 MG tablet Take 1 tablet (100 mg total) by mouth 2 (two) times daily. 60 tablet 11  . furosemide (LASIX) 20 MG tablet Take 1 tablet (20 mg total) by mouth daily. 30 tablet 11  . metoprolol succinate (TOPROL-XL) 50 MG 24 hr tablet Take 1 tablet (50 mg total) by mouth  daily. Take with or immediately following a meal. 30 tablet 11  . PARoxetine (PAXIL) 40 MG tablet Take 1 tablet (40 mg total) by mouth daily. 30 tablet 6  . rivaroxaban (XARELTO) 20 MG TABS tablet TAKE 1 TABLET (20 MG TOTAL) BY MOUTH DAILY WITH SUPPER. 30 tablet 11  . sildenafil (REVATIO) 20 MG tablet Take 1 tablet (20 mg total) by mouth 3 (three) times daily as needed. 90 tablet 3   No current facility-administered medications for this visit.      Allergies:   Lexapro [escitalopram oxalate] and Vicodin [hydrocodone-acetaminophen]   Social History:  The patient  reports that he quit smoking about 19 years ago. He has never used smokeless tobacco. He reports that he drinks about 1.2 oz of alcohol per week . He reports that he does not use drugs.   Family History:   family history includes Cancer (age of onset: 3874) in his father; Coronary artery disease (age of onset: 7030) in his father; Diabetes in his mother; Heart attack in his father; Heart disease in his mother; Hypertension in his father.    Review of Systems: Review of Systems  Constitutional: Positive for malaise/fatigue.  Respiratory: Negative.   Cardiovascular: Negative.   Gastrointestinal: Negative.   Musculoskeletal: Negative.   Neurological: Negative.   Psychiatric/Behavioral: Negative.   All other systems reviewed and are negative.    PHYSICAL EXAM: VS:  BP 120/88 (BP Location: Left Arm, Patient Position: Sitting, Cuff Size: Normal)   Pulse 84   Ht 5\' 9"  (1.753 m)   Wt 252 lb 8 oz (114.5 kg)   BMI 37.29 kg/m  , BMI Body mass index is 37.29 kg/m. GEN: Well nourished, well developed, in no acute distress  HEENT: normal  Neck: no JVD, carotid bruits, or masses Cardiac: Irregularly irregular, no murmurs, rubs, or gallops,no edema  Respiratory:  clear to auscultation bilaterally, normal work of breathing GI: soft, nontender, nondistended, + BS MS: no deformity or atrophy  Skin: warm and dry, no rash Neuro:  Strength  and sensation are intact Psych: euthymic mood, full affect    Recent Labs: 02/01/2016: ALT 28; BUN 20; Creatinine, Ser 1.05; Hemoglobin 15.6; Platelets 303.0; Potassium 4.4; Sodium 137    Lipid Panel Lab Results  Component Value Date   CHOL 177 09/08/2015   HDL 49.80 09/08/2015   LDLCALC 103 (H) 09/08/2015   TRIG 124.0 09/08/2015      Wt Readings from Last 3 Encounters:  07/25/16 252 lb 8 oz (114.5 kg)  04/27/16 255 lb (115.7 kg)  01/15/16 253 lb 4 oz (114.9 kg)       ASSESSMENT AND PLAN:  Persistent atrial fibrillation (HCC) - Plan: EKG 12-Lead Rate well controlled,  Not interested in restoring normal sinus rhythm Potentially could hold the flecainide We'll discuss with Dr. Graciela HusbandsKlein about other options, potentially could increase his metoprolol and wean off flecainide  Snoring Not interested in sleep study at this time Likely causing some of his fatigue  Shortness of breath Likely secondary to underlying atrial fibrillation and obesity, deconditioning. Recommended regular exercise program  Tired Likely multifactorial  including dog waking him up at night, Snoring, Inactivity  Body mass index (BMI) of 35.0 to 35.9 with comorbidity We have encouraged continued exercise, careful diet management in an effort to lose weight.  Erectile dysfunction We have prescribed Viagra to try   Total encounter time more than 25 minutes  Greater than 50% was spent in counseling and coordination of care with the patient   Disposition:   F/U  6 months   Orders Placed This Encounter  Procedures  . EKG 12-Lead     Signed, Dossie Arbourim Candia Kingsbury, M.D., Ph.D. 07/25/2016  [facility], [place] [phone]

## 2016-07-27 ENCOUNTER — Telehealth: Payer: Self-pay | Admitting: Cardiovascular Disease

## 2016-07-27 NOTE — Telephone Encounter (Signed)
Please call Stanley Kane as [facility]

## 2016-07-27 NOTE — Telephone Encounter (Signed)
Pt girlfriend calling stating they asked us to send in a prescription (generic) for Viagra  They went to pick it up and it was about 500  She said she would try and see if patient can try and lose some weight and that it may help getting off the medication  She would like for us to still try and see if there's something else we can do, if not they will try and see if patient can change his diet and lose some weight Also they will [facility]  Please advise.

## 2016-07-27 NOTE — Telephone Encounter (Signed)
She wanted to check on medication instructions and reviewed those with her. Let her know that it was ordered as needed and to try it to see if he gets the desired effect. She verbalized understanding and had no further questions at this time.

## 2016-07-28 NOTE — Telephone Encounter (Signed)
RN has spoken to pt and girlfriend 07/27/16.

## 2016-07-30 IMAGING — MR MRI HEAD WITHOUT AND WITH CONTRAST
11 of 12 series · 39 of 48 positions shown · IV contrast (17mL MULTIHANCE)
Comparison: Head CT 09/25/2014

CLINICAL DATA: Headache, 2 days duration.  Memory disturbance.

EXAM:
MRI HEAD WITHOUT AND WITH CONTRAST
TECHNIQUE: Multiplanar, multiecho pulse sequences of the brain and surrounding
structures were obtained without and with intravenous contrast.
CONTRAST:  20 cc MultiHance

[Series 9: T1 · sagittal · 5.0mm · 0.45mm/px · 1 of 27 slices shown]
[im 1/27]
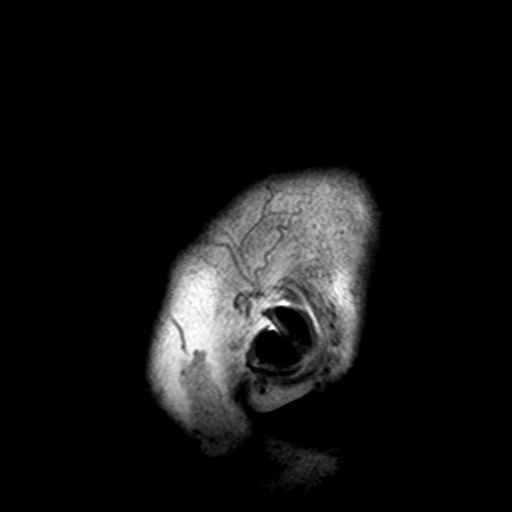

[Series 11: DWI · axial · 4.0mm · 1.80mm/px · z∈[-51,+119]mm · 5 of 44 slices shown (1 of 4)]
[im 1/44]
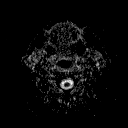
[im 11/44]
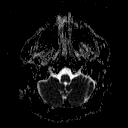
[im 22/44]
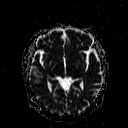
[im 33/44]
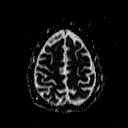
[im 44/44]
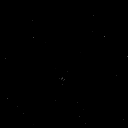

[Series 12: DWI · axial · 4.0mm · 1.80mm/px · z∈[-28,+111]mm · 4 of 37 slices shown (2 of 4)]
[im 1/37]
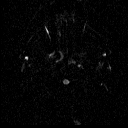
[im 13/37]
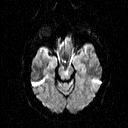
[im 25/37]
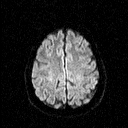
[im 37/37]
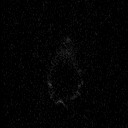

[Series 14: DWI · coronal · 5.0mm · 1.80mm/px · 4 of 39 slices shown (3 of 4)]
[im 1/39]
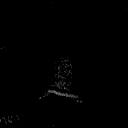
[im 13/39]
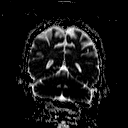
[im 26/39]
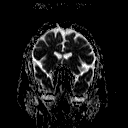
[im 39/39]
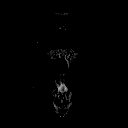

[Series 15: DWI · coronal · 5.0mm · 1.80mm/px · 4 of 37 slices shown (4 of 4)]
[im 1/37]
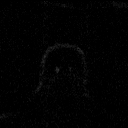
[im 13/37]
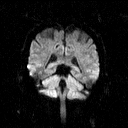
[im 25/37]
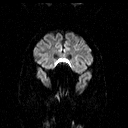
[im 37/37]
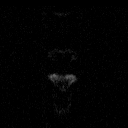

[Series 16: T2 · axial · 5.0mm · 0.47mm/px · z∈[-36,+114]mm · 3 of 25 slices shown (1 of 2)]
[im 1/25]
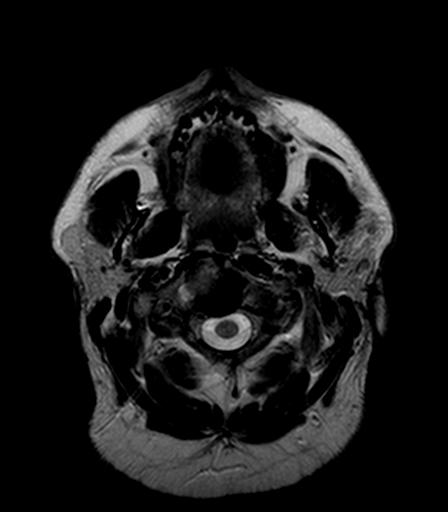
[im 13/25]
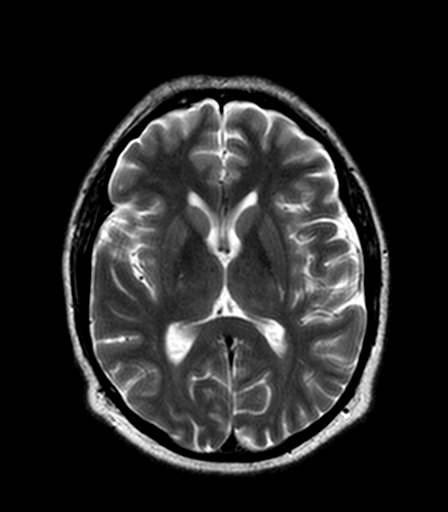
[im 25/25]
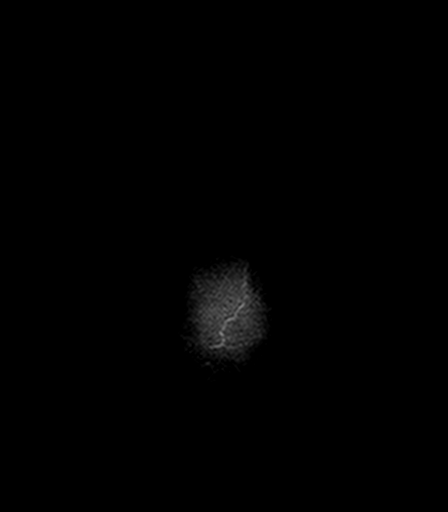

[Series 17: FLAIR · axial · 5.0mm · 0.94mm/px · z∈[-36,+114]mm · 3 of 25 slices shown]
[im 1/25]
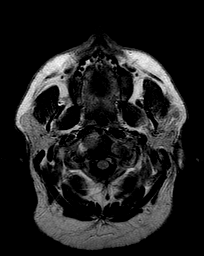
[im 13/25]
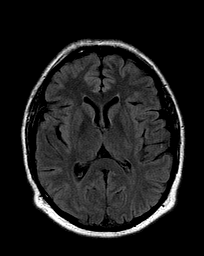
[im 25/25]
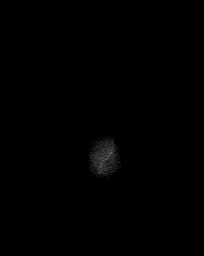

[Series 18: T2 · axial · 5.0mm · 0.47mm/px · z∈[-36,+114]mm · 3 of 25 slices shown (2 of 2)]
[im 1/25]
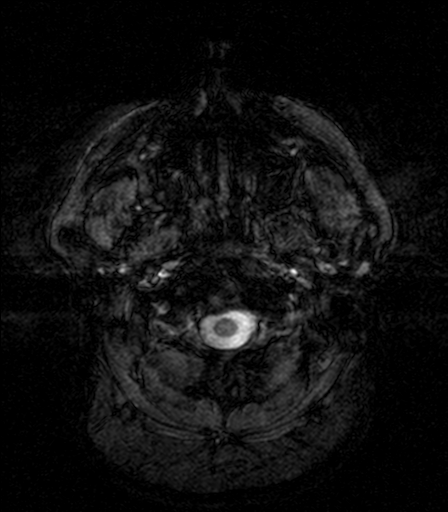
[im 13/25]
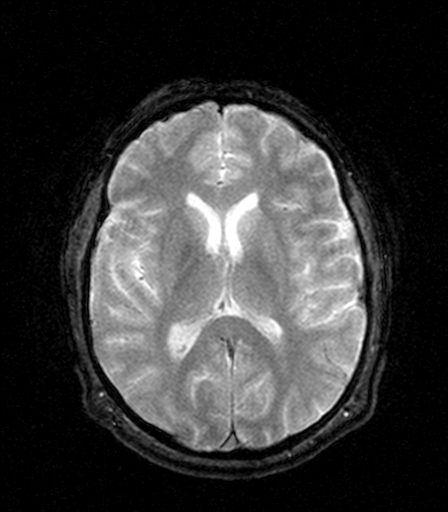
[im 25/25]
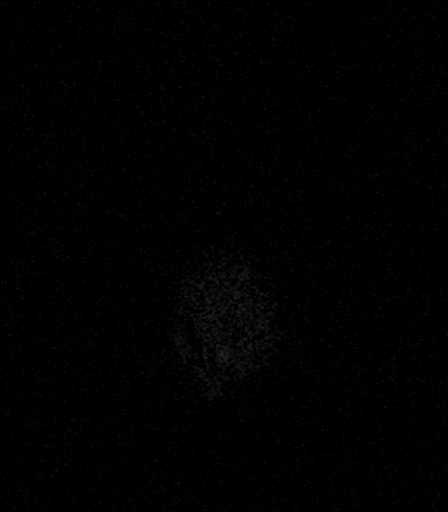

[Series 20: T2 post-contrast · coronal · 5.0mm · 0.45mm/px · 3 of 29 slices shown]
[im 1/29]
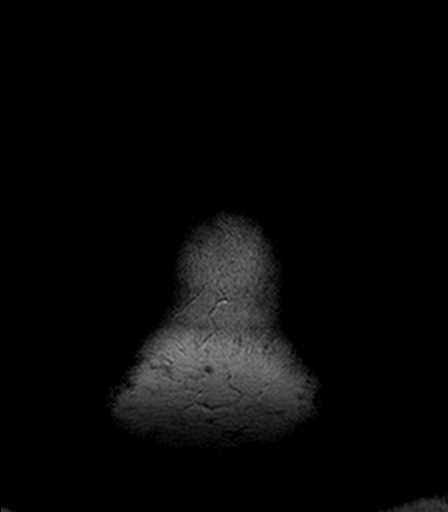
[im 15/29]
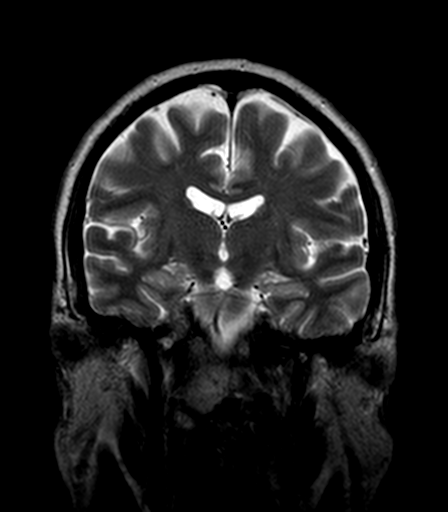
[im 29/29]
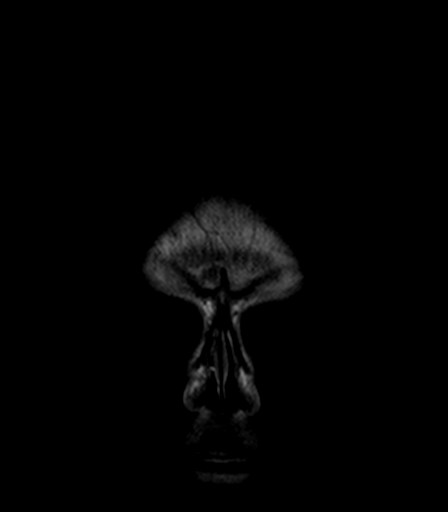

[Series 21: T1 post-contrast · axial · 3.0mm · 0.45mm/px · z∈[-42,+117]mm · 6 of 56 slices shown (1 of 2)]
[im 1/56]
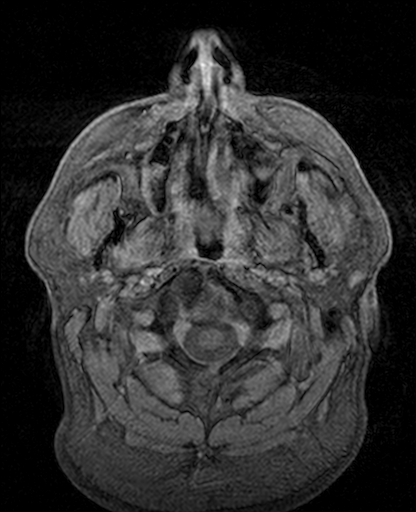
[im 12/56]
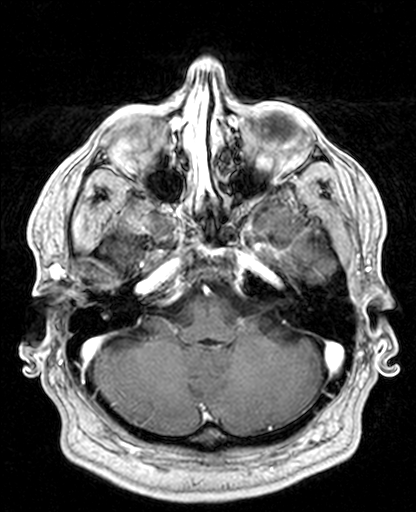
[im 23/56]
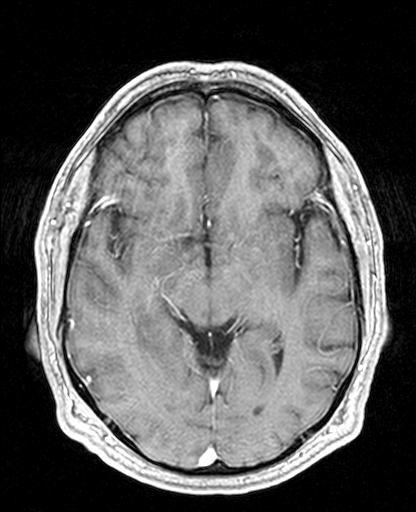
[im 34/56]
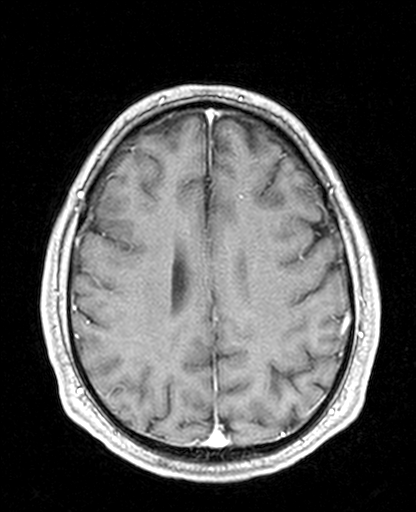
[im 45/56]
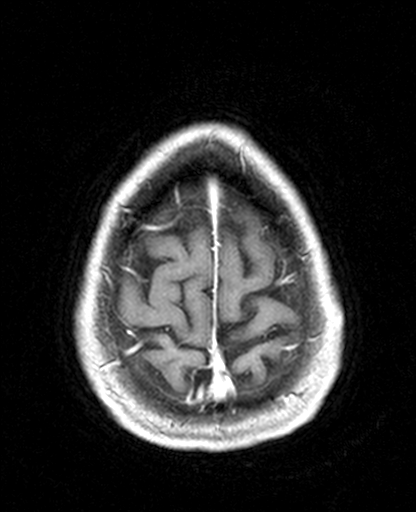
[im 56/56]
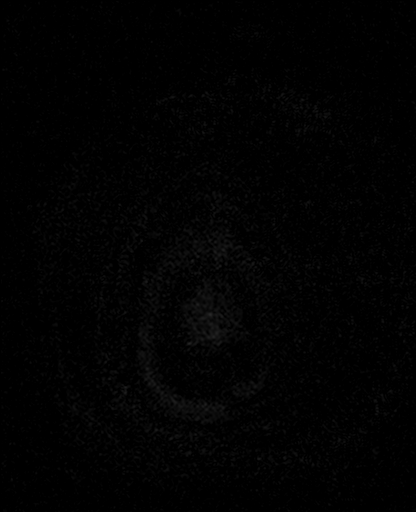

[Series 22: T1 post-contrast · coronal · 5.0mm · 0.45mm/px · 3 of 29 slices shown (2 of 2)]
[im 1/29]
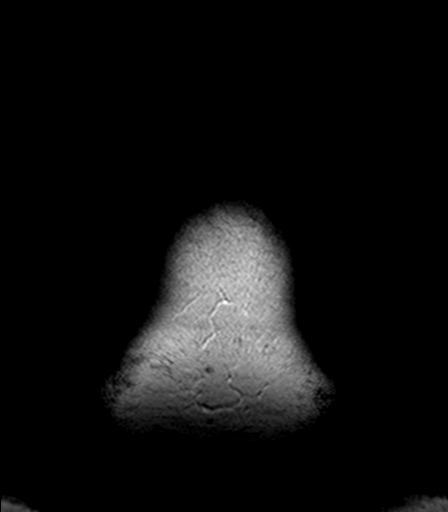
[im 15/29]
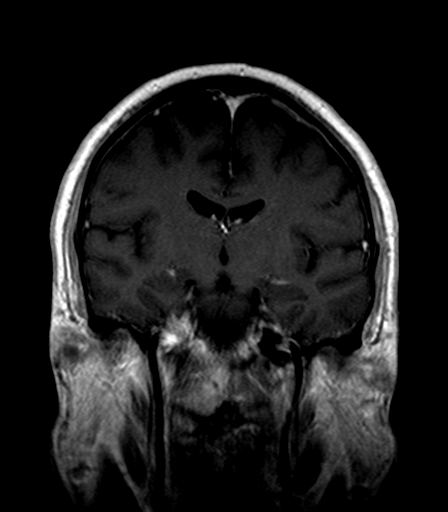
[im 29/29]
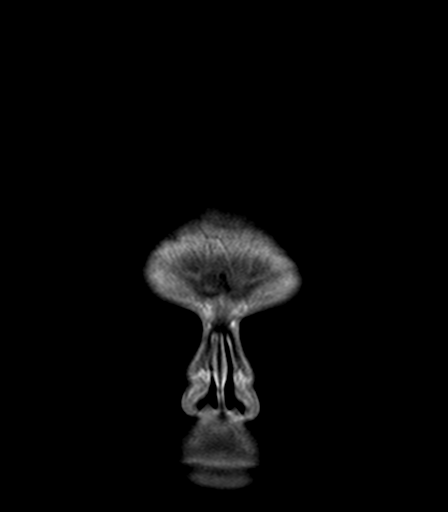

[39 of 48 positions shown; findings below may reference images not displayed]

FINDINGS: The brain has a normal appearance on all pulse sequences without
evidence of malformation, atrophy, old or acute infarction, mass
lesion, hemorrhage, hydrocephalus or extra-axial collection. No
pituitary mass. No fluid in the sinuses, middle ears or mastoids. No
skull lesion. There is an abnormal appearance at the skullbase and
C1-2 articulation which could be post traumatic or congenital. This
would be better evaluated with CT. This could be associated with
headache, but would likely be a chronic situation. There is flow in
the major vessels at the base of the brain. Major venous sinuses
show flow. After contrast administration, no abnormal enhancement
occurs.
IMPRESSION: Normal appearance of the brain itself.

Abnormal appearance at the skullbase/ C1-2 articulation which could
be congenital or post traumatic. This is not well evaluated with
brain MRI. This could possibly be associated with chronic headache.
If there is concern about craniocervical disease with headache,
plain radiographs and/or CT would be more useful.

## 2016-08-20 ENCOUNTER — Telehealth: Payer: Self-pay | Admitting: Family Medicine

## 2016-09-05 ENCOUNTER — Encounter: Payer: Self-pay | Admitting: Gastroenterology

## 2016-09-05 ENCOUNTER — Ambulatory Visit (INDEPENDENT_AMBULATORY_CARE_PROVIDER_SITE_OTHER): Payer: BLUE CROSS/BLUE SHIELD | Admitting: Gastroenterology

## 2016-09-05 ENCOUNTER — Other Ambulatory Visit: Payer: Self-pay

## 2016-09-05 VITALS — BP 122/69 | HR 103 | Temp 98.3°F | Ht 69.0 in | Wt 253.0 lb

## 2016-09-05 DIAGNOSIS — K649 Unspecified hemorrhoids: Secondary | ICD-10-CM

## 2016-09-05 DIAGNOSIS — R109 Unspecified abdominal pain: Secondary | ICD-10-CM | POA: Diagnosis not present

## 2016-09-05 MED ORDER — HYDROCORTISONE ACETATE 25 MG RE SUPP: 25.0000 mg | Freq: Two times a day (BID) | RECTAL | 1 refills | Status: DC

## 2016-09-05 NOTE — Progress Notes (Signed)
Primary Care Physician: [name], MD  Primary Gastroenterologist:  Dr. Midge Minium  Chief Complaint  Patient presents with  . Rectal Bleeding    HPI: Stanley Kane is a 51 y.o. male here who comes in today with a continue report of hemorrhoidal bleeding. The patient also has abdominal pain that is in the right upper quadrant. The patient denies any Quintero stools and also reports that his abdominal pain is made worse with any eating drinking or defecating. The patient denies any unexplained weight loss or family history of colon cancer colon polyps. He reports that his abdominal pain is a burning pain and right where his seatbelt meets his body. The patient is a Naval architect.  Current Outpatient Prescriptions  Medication Sig Dispense Refill  . flecainide (TAMBOCOR) 100 MG tablet Take 1 tablet (100 mg total) by mouth 2 (two) times daily. 60 tablet 11  . furosemide (LASIX) 20 MG tablet Take 1 tablet (20 mg total) by mouth daily. 30 tablet 11  . metoprolol succinate (TOPROL-XL) 50 MG 24 hr tablet Take 1 tablet (50 mg total) by mouth daily. Take with or immediately following a meal. 30 tablet 11  . PARoxetine (PAXIL) 40 MG tablet TAKE 1 TABLET (40 MG TOTAL) BY MOUTH DAILY. 30 tablet 5  . rivaroxaban (XARELTO) 20 MG TABS tablet TAKE 1 TABLET (20 MG TOTAL) BY MOUTH DAILY WITH SUPPER. 30 tablet 11  . hydrocortisone (ANUSOL-HC) 25 MG suppository Place 1 suppository (25 mg total) rectally 2 (two) times daily. 12 suppository 1  . sildenafil (REVATIO) 20 MG tablet Take 1 tablet (20 mg total) by mouth 3 (three) times daily as needed. (Patient not taking: Reported on 09/05/2016) 90 tablet 3   No current facility-administered medications for this visit.     Allergies as of 09/05/2016 - Review Complete 09/05/2016  Allergen Reaction Noted  . Lexapro [escitalopram oxalate] Other (See Comments) 08/05/2015  . Vicodin [hydrocodone-acetaminophen]  04/09/2012    ROS:  General: Negative for  anorexia, weight loss, fever, chills, fatigue, weakness. ENT: Negative for hoarseness, difficulty swallowing , nasal congestion. CV: Negative for chest pain, angina, palpitations, dyspnea on exertion, peripheral edema.  Respiratory: Negative for dyspnea at rest, dyspnea on exertion, cough, sputum, wheezing.  GI: See history of present illness. GU:  Negative for dysuria, hematuria, urinary incontinence, urinary frequency, nocturnal urination.  Endo: Negative for unusual weight change.    Physical Examination:   BP 122/69   Pulse (!) 103   Temp 98.3 F (36.8 C) (Oral)   Ht 5\' 9"  (1.753 m)   Wt 253 lb (114.8 kg)   BMI 37.36 kg/m   General: Well-nourished, well-developed in no acute distress.  Eyes: No icterus. Conjunctivae pink. Mouth: Oropharyngeal mucosa moist and pink , no lesions erythema or exudate. Lungs: Clear to auscultation bilaterally. Non-labored. Heart: Regular rate and rhythm, no murmurs rubs or gallops.  Abdomen: Bowel sounds are normal, positive tenderness to 1 finger palpation while raising the patient's leg 6 inches above the exam table, nondistended, no hepatosplenomegaly or masses, no abdominal bruits or hernia , no rebound or guarding.   Extremities: No lower extremity edema. No clubbing or deformities. Neuro: Alert and oriented x 3.  Grossly intact. Skin: Warm and dry, no jaundice.   Psych: Alert and cooperative, normal mood and affect.  Labs:    Imaging Studies: No results found.  Assessment and Plan:   Stanley Kane is a 51 y.o. y/o male who comes in with abdominal pain that  is still consistent with musculoskeletal pain. The patient has hemorrhoids that will be treated with Anusol suppositories. 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 The patient is also in need of a screening colonoscopy to be done when he is 51 years old next month. Patient has been explained the plan and agrees with it    Midge Miniumarren Harvard Zeiss, MD. Clementeen GrahamFACG   Note: This  dictation was prepared with Dragon dictation along with smaller phrase technology. Any transcriptional errors that result from this process are unintentional.

## 2016-09-06 ENCOUNTER — Other Ambulatory Visit: Payer: Self-pay | Admitting: *Deleted

## 2016-09-13 ENCOUNTER — Telehealth: Payer: Self-pay | Admitting: Gastroenterology

## 2016-09-13 NOTE — Telephone Encounter (Signed)
Patient needs to reschedule his colonoscopy until March and please make it on a Monday. Please call to reschedule.

## 2016-09-16 NOTE — Telephone Encounter (Signed)
Pt rescheduled colonoscopy to 11/07/16. MSC notified.

## 2016-09-16 NOTE — Telephone Encounter (Signed)
Patient would like for you to call and reschedule his colonoscopy until March and on a Monday

## 2016-09-16 NOTE — Telephone Encounter (Signed)
Pt has moved colonoscopy to 11/07/16. Please see if precert is required for Left sided abdominal pain R10.9 and bleeding hemorrhoids K64.9. [facility] with [facility].

## 2016-09-18 ENCOUNTER — Emergency Department: Payer: BLUE CROSS/BLUE SHIELD

## 2016-09-18 ENCOUNTER — Encounter: Payer: Self-pay | Admitting: Emergency Medicine

## 2016-09-18 ENCOUNTER — Emergency Department
Admission: EM | Admit: 2016-09-18 | Discharge: 2016-09-18 | Disposition: A | Discharge status: Home / Self Care | Payer: BLUE CROSS/BLUE SHIELD | Attending: Student in an Organized Health Care Education/Training Program | Admitting: Student in an Organized Health Care Education/Training Program

## 2016-09-18 DIAGNOSIS — Z87891 Personal history of nicotine dependence: Secondary | ICD-10-CM | POA: Diagnosis not present

## 2016-09-18 DIAGNOSIS — Y929 Unspecified place or not applicable: Secondary | ICD-10-CM | POA: Diagnosis not present

## 2016-09-18 DIAGNOSIS — Y999 Unspecified external cause status: Secondary | ICD-10-CM | POA: Diagnosis not present

## 2016-09-18 DIAGNOSIS — S93402A Sprain of unspecified ligament of left ankle, initial encounter: Secondary | ICD-10-CM

## 2016-09-18 DIAGNOSIS — Y9389 Activity, other specified: Secondary | ICD-10-CM | POA: Diagnosis not present

## 2016-09-18 DIAGNOSIS — Z79899 Other long term (current) drug therapy: Secondary | ICD-10-CM | POA: Insufficient documentation

## 2016-09-18 DIAGNOSIS — X501XXA Overexertion from prolonged static or awkward postures, initial encounter: Secondary | ICD-10-CM | POA: Diagnosis not present

## 2016-09-18 DIAGNOSIS — S99912A Unspecified injury of left ankle, initial encounter: Secondary | ICD-10-CM | POA: Diagnosis present

## 2016-09-18 NOTE — ED Triage Notes (Signed)
Pt reports stepped on rock and twisted ankle. C/o right ankle pain. Hurt with weight bearing.

## 2016-09-18 NOTE — ED Provider Notes (Signed)
[facility] Emergency Department Provider Note  ____________________________________________  Time seen: Approximately 12:18 PM  I have reviewed the triage vital signs and the nursing notes.   HISTORY  Chief Complaint Ankle Injury    HPI Stanley Kane is a 51 y.o. male resents to the emergency department with left ankle pain after getting over a rock. Patient states that the rock stuck in his boot and his ankle went inward. Patient is able to move ankle but has pain with flexion and extension. Patient is able to move toes. No change in sensation. Patient denies falling or hitting head. No loss of consciousness. She has not taken anything for pain.   Past Medical History:  Diagnosis Date  . Arrhythmia    "skips a beat occasionally"  . Atrial fibrillation (HCC)   . Fatty liver 06/2014   by abd [place]  . Heart murmur   . Obesity   . Prediabetes   . 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 Health maintenance examination 09/07/2015  . Concussion without loss of consciousness 03/25/2015  . Carpal tunnel syndrome 01/29/2015  . Obstructive sleep apnea 12/29/2014  . Left wrist pain 11/24/2014  . Pain of hand and fingers 11/24/2014  . Transient global amnesia 10/05/2014  . 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 Fatty liver 06/29/2014  . Generalized abdominal pain 06/16/2014  . Prediabetes 06/16/2014  . Body mass index (BMI) of 35.0 to 35.9 with comorbidity 06/02/2014  . Shortness of breath 05/22/2012  . Snoring 04/13/2012  . Atrial fibrillation (HCC) 04/09/2012  . GAD (generalized anxiety disorder) 04/09/2012    Past Surgical History:  Procedure Laterality Date  . CARDIOVASCULAR STRESS TEST  ~2005   thinks at [facility], told normal  . carotid US  08/2014   no plaque  . COLONOSCOPY  2000   WNL, for weight loss after started gym  . MRI  08/2014    brain - WNL, skull base C1/2 articulation  . TONSILLECTOMY AND ADENOIDECTOMY  childhood  . US ECHOCARDIOGRAPHY  08/2014   EF 60%, nl vent fxn, mildly dilated LA, mild MR, mild TR    Prior to Admission medications   Medication Sig Start Date End Date Taking? Authorizing Provider  flecainide (TAMBOCOR) 100 MG tablet Take 1 tablet (100 mg total) by mouth 2 (two) times daily. 07/25/16   Antonieta Iba, MD  furosemide (LASIX) 20 MG tablet Take 1 tablet (20 mg total) by mouth daily. 07/25/16 07/25/17  Antonieta Iba, MD  hydrocortisone (ANUSOL-HC) 25 MG suppository Place 1 suppository (25 mg total) rectally 2 (two) times daily. 09/05/16   Midge Minium, MD  metoprolol succinate (TOPROL-XL) 50 MG 24 hr tablet Take 1 tablet (50 mg total) by mouth daily. Take with or immediately following a meal. 07/25/16   Antonieta Iba, MD  PARoxetine (PAXIL) 40 MG tablet TAKE 1 TABLET (40 MG TOTAL) BY MOUTH DAILY. 08/20/16   [name], MD  rivaroxaban (XARELTO) 20 MG TABS tablet TAKE 1 TABLET (20 MG TOTAL) BY MOUTH DAILY WITH SUPPER. 07/25/16   Antonieta Iba, MD  sildenafil (REVATIO) 20 MG tablet Take 1 tablet (20 mg total) by mouth 3 (three) times daily as needed. Patient not taking: Reported on 09/05/2016 07/25/16   Antonieta Iba, MD    Allergies Lexapro [escitalopram oxalate] and Vicodin [hydrocodone-acetaminophen]  Family History  Problem Relation  Age of Onset  . Hypertension Father   . Cancer Father 34    liver  . Coronary artery disease Father 50  . Heart attack Father   . Diabetes Mother   . Heart disease Mother   . Stroke Neg Hx     Social History Social History  Substance Use Topics  . Smoking status: Former Smoker    Quit date: 08/29/1996  . Smokeless tobacco: Never Used  . Alcohol use 1.2 oz/week    2 Cans of beer per week     Comment: Occasional     Review of Systems  Constitutional: No fever/chills Cardiovascular: No chest pain. Respiratory: No cough. No  SOB. Gastrointestinal: No abdominal pain.  No nausea, no vomiting.  Skin: Negative for rash, abrasions, lacerations, ecchymosis. Neurological: Negative for headaches, numbness or tingling   ____________________________________________   PHYSICAL EXAM:  VITAL SIGNS: ED Triage Vitals [09/18/16 1014]  Enc Vitals Group     BP 112/87     Pulse Rate 97     Resp 17     Temp 98.1 F (36.7 C)     Temp Source Oral     SpO2 98 %     Weight 240 lb (108.9 kg)     Height 5\' 9"  (1.753 m)     Head Circumference      Peak Flow      Pain Score      Pain Loc      Pain Edu?      Excl. in GC?      Constitutional: Alert and oriented. Well appearing and in no acute distress. Eyes: Conjunctivae are normal. PERRL. EOMI. Head: Atraumatic. ENT:      Ears:      Nose: No congestion/rhinnorhea.      Mouth/Throat: Mucous membranes are moist.  Neck: No stridor.   Cardiovascular: Normal rate, regular rhythm. Good peripheral circulation. 2+ dorsalis pedis and posterior tibialis pulses. Respiratory: Normal respiratory effort without tachypnea or retractions. Lungs CTAB. Good air entry to the bases with no decreased or absent breath sounds. Musculoskeletal: Full range of motion to all extremities. No gross deformities appreciated. Tenderness to palpation over anterior talofibular ligament. No bruising or swelling. Neurologic:  Normal speech and language. No gross focal neurologic deficits are appreciated. Physician of toes intact. Skin:  Skin is warm, dry and intact. No rash noted. Psychiatric: Mood and affect are normal. Speech and behavior are normal. Patient exhibits appropriate insight and judgement.   ____________________________________________   LABS (all labs ordered are listed, but only abnormal results are displayed)  Labs Reviewed - No data to display ____________________________________________  EKG   ____________________________________________  RADIOLOGY Lexine Baton,  personally viewed and evaluated these images (plain radiographs) as part of my medical decision making, as well as reviewing the written report by the radiologist.  Dg Ankle Complete Left  Result Date: 09/18/2016 CLINICAL DATA:  Stepped on rock and twisted ankle this morning. Lateral ankle pain and swelling. Unable to bear weight. Initial encounter. EXAM: LEFT ANKLE COMPLETE - 3+ VIEW COMPARISON:  None. FINDINGS: There is no evidence of fracture, dislocation, or joint effusion. There is no evidence of arthropathy or other focal bone abnormality. Soft tissues are unremarkable. IMPRESSION: Negative. Electronically Signed   By: Myles Rosenthal M.D.   On: 09/18/2016 11:26    ____________________________________________    PROCEDURES  Procedure(s) performed:    Procedures    Medications - No data to display   ____________________________________________   INITIAL IMPRESSION /  ASSESSMENT AND PLAN / ED COURSE  Pertinent labs & imaging results that were available during my care of the patient were reviewed by me and considered in my medical decision making (see chart for details).  Review of the [place] CSRS was performed in accordance of the [facility] prior to dispensing any controlled drugs.     Patient's diagnosis is consistent with ankle sprain. Vital signs and exam are reassuring. Ankle was wrapped and crutches were given.  Patient is to follow up with PCP as directed. Patient is given ED precautions to return to the ED for any worsening or new symptoms.     ____________________________________________  FINAL CLINICAL IMPRESSION(S) / ED DIAGNOSES  Final diagnoses:  Sprain of left ankle, unspecified ligament, initial encounter      NEW MEDICATIONS STARTED DURING THIS VISIT:  New Prescriptions   No medications on file        This chart was dictated using voice recognition software/Dragon. Despite best efforts to proofread, errors can occur which can change the meaning. Any  change was purely unintentional.    Enid DerryAshley Selene Peltzer, PA-C 09/18/16 1301    Willy EddyPatrick Robinson, MD 09/18/16 1314

## 2016-09-18 NOTE — ED Notes (Signed)
Pt reports he was getting ready to leave in his [id number] wheeler when a rock got stuck under the heel of his boot and twisted his left ankle. Pt states this is NOT W/C.  Pt reports swelling and difficulty bearing weight on the left foot. No pain medication.  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

## 2016-09-18 NOTE — ED Notes (Signed)

## 2016-09-28 ENCOUNTER — Telehealth: Payer: Self-pay | Admitting: Cardiovascular Disease

## 2016-09-28 NOTE — Telephone Encounter (Signed)
This encounter was created in error - please disregard.

## 2016-09-28 NOTE — Telephone Encounter (Signed)
Spoke with patients girlfriend calling stating that the xarelto is way expensive and they want to know about some other alternatives. Let her know that most of these medications are expensive and she also asked about coumadin. Let her know that medication requires frequent monitoring and dosage adjustments but this would be something they would need to discuss with Dr. Mariah MillingGollan. Will place some samples up front for them and let them know he is due to be seen for follow up and then they can discuss this with the doctor at that time. She was in agreement with plan and had no further questions at this time. She wants to call back for the appointment.  Medication Samples have been provided to the patient.  Drug name: Xarelto       Strength: 20 mg        Qty: 2 bottles  LOT: [id number]  Exp.Date: 04/20  Dosing instructions: Take one tablet daily  Samples placed up front for pickup.

## 2016-09-28 NOTE — Telephone Encounter (Signed)
Pt fiance states Xarelto is too expensive at their current pharmacy. She is not sure if [facility] will be less expensive, or if pt needs to switch to something different. States pt has enough for 2 more weeks.

## 2016-10-17 ENCOUNTER — Ambulatory Visit (INDEPENDENT_AMBULATORY_CARE_PROVIDER_SITE_OTHER)
Admission: RE | Admit: 2016-10-17 | Discharge: 2016-10-17 | Disposition: A | Discharge status: Home / Self Care | Payer: Self-pay | Source: Ambulatory Visit | Attending: Cardiovascular Disease | Admitting: Cardiovascular Disease

## 2016-10-17 ENCOUNTER — Encounter: Payer: Self-pay | Admitting: Cardiovascular Disease

## 2016-10-17 ENCOUNTER — Telehealth: Payer: Self-pay | Admitting: Cardiovascular Disease

## 2016-10-17 ENCOUNTER — Ambulatory Visit (INDEPENDENT_AMBULATORY_CARE_PROVIDER_SITE_OTHER): Payer: BLUE CROSS/BLUE SHIELD | Admitting: Cardiovascular Disease

## 2016-10-17 VITALS — BP 110/80 | HR 88 | Ht 69.0 in | Wt 248.8 lb

## 2016-10-17 DIAGNOSIS — F411 Generalized anxiety disorder: Secondary | ICD-10-CM | POA: Diagnosis not present

## 2016-10-17 DIAGNOSIS — I481 Persistent atrial fibrillation: Secondary | ICD-10-CM | POA: Diagnosis not present

## 2016-10-17 DIAGNOSIS — R079 Chest pain, unspecified: Secondary | ICD-10-CM | POA: Insufficient documentation

## 2016-10-17 DIAGNOSIS — R0602 Shortness of breath: Secondary | ICD-10-CM | POA: Diagnosis not present

## 2016-10-17 DIAGNOSIS — I4819 Other persistent atrial fibrillation: Secondary | ICD-10-CM

## 2016-10-17 MED ORDER — METOPROLOL SUCCINATE ER 50 MG PO TB24: 50.0000 mg | ORAL_TABLET | Freq: Two times a day (BID) | ORAL | 3 refills | Status: DC

## 2016-10-17 MED ORDER — RIVAROXABAN 20 MG PO TABS: ORAL_TABLET | ORAL | 3 refills | Status: DC

## 2016-10-17 NOTE — Telephone Encounter (Signed)
Printed for Dr. Mariah MillingGollan to review when he come out of pt room.

## 2016-10-17 NOTE — Telephone Encounter (Signed)
Spoke w/ pt's fiancee. She reports that pt developed sharp chest pain while at the [employer]. He moved his arm around as discussed during ov today and this seems to have helped. He is interested in further testing.  Per Dr. Mariah MillingGollan, placed order for CT Cardiac Score; gave [name] # to call and schedule at her/pt's convenience. She will call now to see if pt can get in today.

## 2016-10-17 NOTE — Telephone Encounter (Signed)
Pt girlfriend calling stating they just left office  They are out and patient is having those chest pains Would like to know what to do Please advise.

## 2016-10-17 NOTE — Progress Notes (Signed)
Cardiology Office Note  Date:  10/17/2016   ID:  Stanley Kane, DOB [birth year], MRN [id number]  PCP:  [name], MD   Chief Complaint  Patient presents with  . other    Afib c/o chest pain and left arm tingling. Meds reviewed verbally with pt.    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  Developing atrial fibrillation in 2013 with cardioversion 07/06/2012, successful restoring of his normal sinus rhythm.  Previous clinic visit with recurrent atrial fibrillation started back on anticoagulation, after several weeks started on flecainide Obesity, Suspected obstructive sleep apnea, not on CPAP . Sleep apnea noted during anesthesia of previous cardioversion   In follow-up today he reports having significant stress at work Having truck problems, wife also sick with the flu out of work for one month Using a treadmill 30-40 minutes almost daily with no symptoms of chest pain or shortness of breath, walks a good pace  He has had rare episodes of sharp chest pain radiating into his left shoulder typically presents at rest long driving or on the couch. Possibly some reproducible pain when he moves his left arm He said 3-4 episodes  Biggest complaint is anticoagulation cost Currently goes to [facility], high cost of xarelto He thinks that he makes too much money to get company assistance  Feels he is losing weight by using treadmill Wife reports that he is not snoring  In the past, did not want to restore normal sinus rhythm as he was feeling well He is asking today whether it would be worth to restore normal sinus rhythm   still with weight issues, sleep issues, mildly dilated left atrium As been in atrial fibrillation for years   EKG on today's visit shows atrial fibrillation with ventricular rate 88 bpm, no significant ST or T-wave  changes  Other past medical history On a earlier visit in 2016, he hasn't taking his anticoagulation and flecainide 150 mg twice a day, he had converted back to atrial fibrillation, was relatively asymptomatic He underwent cardioversion as it felt it was affecting him in some ways, such as his sex life. This was done on 11/25/2014. Normal sinus rhythm successfully restored. He was maintained on anticoagulation, flecainide, Lasix daily, metoprolol.  He was scheduled for cardioversion December 2015 and on arrival was in normal sinus rhythm. Prior to that had been started on flecainide. Cardioversion was canceled.  Wife reports he had a period of amnesia after she woke him from a deep sleep. Went to the hospital, workup was unremarkable. Unable to exclude hypercapnia as he is not on CPAP. He and his wife are hoping to avoid sleep studies and CPAP His weight has been trending upwards, eating poorly. Not exercising  Significant stress and anxiety when driving his stroke in the [place] area . Wife reports he has anger issues when driving . He previously went for routine DOT physical and this was denied as he was in atrial fibrillation and not on anticoagulation .   Prior Echocardiogram showed ejection fraction 50%, mild dilatation of the left atrium, no significant valvular disease  He reports having a stress test at [place] 6 years ago. This was reportedly normal.  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  PSH:    Past Surgical History:  Procedure Laterality Date  . CARDIOVASCULAR STRESS TEST  ~2005   [facility]  . carotid us  08/2014   no plaque  . COLONOSCOPY  2000   WNL, for weight loss after started gym  . MRI  08/2014   brain - WNL, skull base C1/2 articulation  . TONSILLECTOMY AND ADENOIDECTOMY  childhood  . US  ECHOCARDIOGRAPHY  08/2014   EF 60%, nl vent fxn, mildly dilated LA, mild MR, mild TR    Current Outpatient Prescriptions  Medication Sig Dispense Refill  . furosemide (LASIX) 20 MG tablet Take 1 tablet (20 mg total) by mouth daily as needed. 30 tablet 11  . hydrocortisone (ANUSOL-HC) 25 MG suppository Place 1 suppository (25 mg total) rectally 2 (two) times daily. 12 suppository 1  . metoprolol succinate (TOPROL-XL) 50 MG 24 hr tablet Take 1 tablet (50 mg total) by mouth 2 (two) times daily. Take with or immediately following a meal. 180 tablet 3  . PARoxetine (PAXIL) 40 MG tablet TAKE 1 TABLET (40 MG TOTAL) BY MOUTH DAILY. 30 tablet 5  . rivaroxaban (XARELTO) 20 MG TABS tablet TAKE 1 TABLET (20 MG TOTAL) BY MOUTH DAILY WITH SUPPER. 90 tablet 3  . sildenafil (REVATIO) 20 MG tablet Take 1 tablet (20 mg total) by mouth 3 (three) times daily as needed. 90 tablet 3   No current facility-administered medications for this visit.      Allergies:   Lexapro [escitalopram oxalate] and Vicodin [hydrocodone-acetaminophen]   Social History:  The patient  reports that he quit smoking about 20 years ago. He has never used smokeless tobacco. He reports that he drinks about 1.2 oz of alcohol per week . He reports that he does not use drugs.   Family History:   family history includes Cancer (age of onset: 2474) in his father; Coronary artery disease (age of onset: 1130) in his father; Diabetes in his mother; Heart attack in his father; Heart disease in his mother; Hypertension in his father.    Review of Systems: Review of Systems  Constitutional: Negative.   Respiratory: Negative.   Cardiovascular: Positive for chest pain.  Gastrointestinal: Negative.   Musculoskeletal: Negative.   Neurological: Negative.   Psychiatric/Behavioral: Negative.   All other systems reviewed and are negative.    PHYSICAL EXAM: VS:  BP 110/80 (BP Location: Left Arm, Patient Position: Sitting, Cuff Size: Large)   Pulse 88    Ht 5\' 9"  (1.753 m)   Wt 248 lb 12 oz (112.8 kg)   BMI 36.73 kg/m  , BMI Body mass index is 36.73 kg/m. GEN: Well nourished, well developed, in no acute distress , obesity HEENT: normal  Neck: no JVD, carotid bruits, or masses Cardiac: irregularly irregular,no murmurs, rubs, or gallops,no edema  Respiratory:  clear to auscultation bilaterally, normal work of breathing GI: soft, nontender, nondistended, + BS MS: no deformity or atrophy  Skin: warm and dry, no rash Neuro:  Strength and sensation are intact Psych: euthymic mood, full affect    Recent Labs: 02/01/2016: ALT 28; BUN 20; Creatinine, Ser 1.05; Hemoglobin 15.6; Platelets 303.0; Potassium 4.4; Sodium 137    Lipid Panel Lab Results  Component Value Date   CHOL 177 09/08/2015   HDL 49.80 09/08/2015   LDLCALC 103 (H) 09/08/2015   TRIG 124.0 09/08/2015      Wt Readings from Last 3 Encounters:  10/17/16 248 lb 12 oz (112.8 kg)  09/18/16 240 lb (108.9 kg)  09/05/16 253 lb (114.8 kg)  ASSESSMENT AND PLAN:  Persistent atrial fibrillation (HCC) - Plan: EKG 12-Lead Persistent atrial fibrillation, we will stop the flecainide Recommended increased metoprolol up to 50 mg in the morning, 25 mg in the evening Previously had significant fatigue on high-dose metoprolol If he has recurrent fatigue or poor control heart rate, we may have to start digoxin for rate control He was on digoxin in the past  GAD (generalized anxiety disorder) Significant stress at home, truck problems, wife was sick, out of work for one month  Shortness of breath Breathing dramatically improved by using the treadmill We have encouraged continued exercise, careful diet management in an effort to lose weight.  Chest pain, unspecified type Atypical chest pain presenting at rest, but was able to exert himself without symptoms We discussed various types of testing that are available if symptoms get worse, more frequent or present with  exertion We talked about regular treadmill, treadmill Myoview, CT coronary calcium scoring He has few risk factors as he is not diabetic, cholesterol reasonably well-controlled on no cholesterol medication and no smoking The above was discussed with him in detail   Total encounter time more than 25 minutes  Greater than 50% was spent in counseling and coordination of care with the patient   Disposition:   F/U  6 months   Orders Placed This Encounter  Procedures  . EKG 12-Lead     Signed, Dossie Arbour, M.D., Ph.D. 10/17/2016  [facility] [place], [place] [phone]

## 2016-10-17 NOTE — Patient Instructions (Addendum)
Medication Instructions:   Please stop the flecainide  Check the price of xarelto at different pharmacy with coupon  Increase the metoprolol up to one in the Am and 1/2 pill in the PM  If heart rate runs fast, call the office We might need digoxin  Labwork:  No new labs needed  Testing/Procedures:  No further testing at this time   I recommend watching educational videos on topics of interest to you at:       www.goemmi.com  Enter code: HEARTCARE    Follow-Up: It was a pleasure seeing you in the office today. Please call us if you have new issues that need to be addressed before your next appt.  [phone]  Your physician wants you to follow-up in: 6 months.  You will receive a reminder letter in the mail two months in advance. If you don't receive a letter, please call our office to schedule the follow-up appointment.  If you need a refill on your cardiac medications before your next appointment, please call your pharmacy.

## 2016-10-18 ENCOUNTER — Telehealth: Payer: Self-pay | Admitting: Cardiovascular Disease

## 2016-10-18 NOTE — Telephone Encounter (Signed)
Pt fiance would like results from CT cardiac scoring test. Please call.

## 2016-10-18 NOTE — Telephone Encounter (Signed)
Test has not been resulted yet.

## 2016-10-21 ENCOUNTER — Telehealth: Payer: Self-pay | Admitting: Gastroenterology

## 2016-10-21 NOTE — Telephone Encounter (Signed)
Patient needs to cancel his colonoscopy and will call back to reschedule. I didn't call to cancel with anyone.

## 2016-10-24 NOTE — Telephone Encounter (Signed)
[facility] has been contacted and procedure cancelled..Marland Kitchen

## 2016-11-07 ENCOUNTER — Ambulatory Visit: Admit: 2016-11-07 | Payer: BLUE CROSS/BLUE SHIELD | Admitting: Gastroenterology

## 2016-11-07 SURGERY — COLONOSCOPY WITH PROPOFOL
Anesthesia: Choice

## 2017-01-24 ENCOUNTER — Other Ambulatory Visit: Payer: Self-pay | Admitting: Family Medicine

## 2017-01-24 NOTE — Telephone Encounter (Signed)
Received refill electronically Last refill 08/20/16 #30/5 Last office visit 01/15/16 See allergy/contraindication

## 2017-03-24 ENCOUNTER — Other Ambulatory Visit: Payer: Self-pay | Admitting: Family Medicine

## 2017-04-21 ENCOUNTER — Other Ambulatory Visit: Payer: Self-pay | Admitting: Family Medicine

## 2017-04-24 NOTE — Telephone Encounter (Signed)
Pt has not been seen since 12/2015. Last 2 Rx indicate OV required. pls advise

## 2017-05-08 ENCOUNTER — Ambulatory Visit: Payer: Self-pay | Admitting: Cardiovascular Disease

## 2017-05-26 NOTE — Progress Notes (Deleted)
Cardiology Office Note  Date:  05/26/2017   ID:  Stanley Kane, DOB [birth year], MRN [id number]  PCP:  [name], MD   No chief complaint on file.   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  Previous clinic visit with recurrent atrial fibrillation started back on anticoagulation, after several weeks started on flecainide Obesity, Suspected obstructive sleep apnea, not on CPAP .  Sleep apnea noted during anesthesia of previous cardioversion  who presents for routine followup for atrial fibrillation.    In follow-up today he reports having significant stress at work Having truck problems, wife also sick with the flu out of work for one month Using a treadmill 30-40 minutes almost daily with no symptoms of chest pain or shortness of breath, walks a good pace  He has had rare episodes of sharp chest pain radiating into his left shoulder typically presents at rest long driving or on the couch. Possibly some reproducible pain when he moves his left arm He said 3-4 episodes  Biggest complaint is anticoagulation cost Currently goes to [facility], high cost of xarelto He thinks that he makes too much money to get company assistance  Feels he is losing weight by using treadmill Wife reports that he is not snoring  In the past, did not want to restore normal sinus rhythm as he was feeling well He is asking today whether it would be worth to restore normal sinus rhythm   still with weight issues, sleep issues, mildly dilated left atrium As been in atrial fibrillation for years   EKG on today's visit shows atrial fibrillation with ventricular rate 88 bpm, no significant ST or T-wave changes  Other past medical history On a earlier visit in 2016,  he hasn't taking his anticoagulation and flecainide 150 mg twice a day, he had converted back to atrial fibrillation, was relatively asymptomatic He underwent cardioversion as it felt it was affecting him in some ways, such as his sex life. This was done on 11/25/2014. Normal sinus rhythm successfully restored. He was maintained on anticoagulation, flecainide, Lasix daily, metoprolol.  He was scheduled for cardioversion December 2015 and on arrival was in normal sinus rhythm. Prior to that had been started on flecainide. Cardioversion was canceled.  Wife reports he had a period of amnesia after she woke him from a deep sleep. Went to the hospital, workup was unremarkable. Unable to exclude hypercapnia as he is not on CPAP. He and his wife are hoping to avoid sleep studies and CPAP His weight has been trending upwards, eating poorly. Not exercising  Significant stress and anxiety when driving his stroke in the [place] area . Wife reports he has anger issues when driving . He previously went for routine DOT physical and this was denied as he was in atrial fibrillation and not on anticoagulation .   Prior Echocardiogram showed ejection fraction 50%, mild dilatation of the left atrium, no significant valvular disease  He reports having a stress test at [facility] 6 years ago. This was reportedly normal.  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  PSH:    Past Surgical History:  Procedure Laterality Date  . CARDIOVASCULAR STRESS TEST  ~2005   thinks  at [facility], told normal  . carotid US  08/2014   no plaque  . COLONOSCOPY  2000   WNL, for weight loss after started gym  . MRI  08/2014   brain - WNL, skull base C1/2 articulation  . TONSILLECTOMY AND ADENOIDECTOMY  childhood  . US ECHOCARDIOGRAPHY  08/2014   EF 60%, nl vent fxn, mildly dilated LA, mild MR,  mild TR    Current Outpatient Prescriptions  Medication Sig Dispense Refill  . furosemide (LASIX) 20 MG tablet Take 1 tablet (20 mg total) by mouth daily as needed. 30 tablet 11  . hydrocortisone (ANUSOL-HC) 25 MG suppository Place 1 suppository (25 mg total) rectally 2 (two) times daily. 12 suppository 1  . metoprolol succinate (TOPROL-XL) 50 MG 24 hr tablet Take 1 tablet (50 mg total) by mouth 2 (two) times daily. Take with or immediately following a meal. 180 tablet 3  . PARoxetine (PAXIL) 40 MG tablet TAKE ONE TABLET BY MOUTH DAILY. MUST MAKE OFFICE VISIT 30 tablet 0  . rivaroxaban (XARELTO) 20 MG TABS tablet TAKE 1 TABLET (20 MG TOTAL) BY MOUTH DAILY WITH SUPPER. 90 tablet 3  . sildenafil (REVATIO) 20 MG tablet Take 1 tablet (20 mg total) by mouth 3 (three) times daily as needed. 90 tablet 3   No current facility-administered medications for this visit.      Allergies:   Lexapro [escitalopram oxalate] and Vicodin [hydrocodone-acetaminophen]   Social History:  The patient  reports that he quit smoking about 20 years ago. He has never used smokeless tobacco. He reports that he drinks about 1.2 oz of alcohol per week . He reports that he does not use drugs.   Family History:   family history includes Cancer (age of onset: 32) in his father; Coronary artery disease (age of onset: 70) in his father; Diabetes in his mother; Heart attack in his father; Heart disease in his mother; Hypertension in his father.    Review of Systems: Review of Systems  Constitutional: Negative.   Respiratory: Negative.   Cardiovascular: Positive for chest pain.  Gastrointestinal: Negative.   Musculoskeletal: Negative.   Neurological: Negative.   Psychiatric/Behavioral: Negative.   All other systems reviewed and are negative.    PHYSICAL EXAM: VS:  There were no vitals taken for this visit. , BMI There is no height or weight on file to calculate BMI. GEN: Well nourished, well developed, in no acute  distress , obesity HEENT: normal  Neck: no JVD, carotid bruits, or masses Cardiac: irregularly irregular,no murmurs, rubs, or gallops,no edema  Respiratory:  clear to auscultation bilaterally, normal work of breathing GI: soft, nontender, nondistended, + BS MS: no deformity or atrophy  Skin: warm and dry, no rash Neuro:  Strength and sensation are intact Psych: euthymic mood, full affect    Recent Labs: No results found for requested labs within last 8760 hours.    Lipid Panel Lab Results  Component Value Date   CHOL 177 09/08/2015   HDL 49.80 09/08/2015   LDLCALC 103 (H) 09/08/2015   TRIG 124.0 09/08/2015      Wt Readings from Last 3 Encounters:  10/17/16 248 lb 12 oz (112.8 kg)  09/18/16 240 lb (108.9 kg)  09/05/16 253 lb (114.8 kg)       ASSESSMENT AND PLAN:  Persistent atrial fibrillation (HCC) - Plan: EKG 12-Lead Persistent atrial fibrillation, we will stop the flecainide Recommended increased metoprolol up to 50 mg in the morning, 25 mg in the evening Previously had  significant fatigue on high-dose metoprolol If he has recurrent fatigue or poor control heart rate, we may have to start digoxin for rate control He was on digoxin in the past  GAD (generalized anxiety disorder) Significant stress at home, truck problems, wife was sick, out of work for one month  Shortness of breath Breathing dramatically improved by using the treadmill We have encouraged continued exercise, careful diet management in an effort to lose weight.  Chest pain, unspecified type Atypical chest pain presenting at rest, but was able to exert himself without symptoms We discussed various types of testing that are available if symptoms get worse, more frequent or present with exertion We talked about regular treadmill, treadmill Myoview, CT coronary calcium scoring He has few risk factors as he is not diabetic, cholesterol reasonably well-controlled on no cholesterol medication and no  smoking The above was discussed with him in detail   Total encounter time more than 25 minutes  Greater than 50% was spent in counseling and coordination of care with the patient   Disposition:   F/U  6 months   No orders of the defined types were placed in this encounter.    Signed, Dossie Arbour, M.D., Ph.D. 05/26/2017  [facility] [place], [place] [phone]

## 2017-05-29 ENCOUNTER — Ambulatory Visit: Payer: Self-pay | Admitting: Cardiovascular Disease

## 2017-06-19 ENCOUNTER — Other Ambulatory Visit: Payer: Self-pay | Admitting: Family Medicine

## 2017-07-27 ENCOUNTER — Other Ambulatory Visit: Payer: Self-pay | Admitting: Family Medicine

## 2017-07-27 ENCOUNTER — Other Ambulatory Visit: Payer: Self-pay | Admitting: Cardiovascular Disease

## 2017-07-28 ENCOUNTER — Other Ambulatory Visit: Payer: Self-pay | Admitting: Family Medicine

## 2017-08-07 ENCOUNTER — Ambulatory Visit: Payer: Self-pay | Admitting: Family Medicine

## 2017-08-14 ENCOUNTER — Ambulatory Visit: Payer: Self-pay | Admitting: Family Medicine

## 2017-08-14 ENCOUNTER — Encounter: Payer: Self-pay | Admitting: Family Medicine

## 2017-08-14 VITALS — BP 120/64 | HR 67 | Temp 97.9°F | Wt 241.0 lb

## 2017-08-14 DIAGNOSIS — F411 Generalized anxiety disorder: Secondary | ICD-10-CM

## 2017-08-14 DIAGNOSIS — Z1211 Encounter for screening for malignant neoplasm of colon: Secondary | ICD-10-CM

## 2017-08-14 MED ORDER — PAROXETINE HCL 40 MG PO TABS
40.0000 mg | ORAL_TABLET | ORAL | 3 refills | Status: DC
Start: 2017-08-14 — End: 2018-08-27

## 2017-08-14 NOTE — Assessment & Plan Note (Signed)
Chronic, stable on paxil 40mg  daily which was started 06/2015. Pt desires to continue. Denies significant appetite changes on this medication.

## 2017-08-14 NOTE — Patient Instructions (Addendum)
Pass by lab to pick up stool kit. Paxil refilled for a year.  Continue current medicines.  Good to see you today, call us with questions. Return at your convenience for physical.

## 2017-08-14 NOTE — Progress Notes (Signed)
   BP 120/64 (BP Location: Left Arm, Patient Position: Sitting, Cuff Size: Normal)   Pulse 67   Temp 97.9 F (36.6 C) (Oral)   Wt 241 lb (109.3 kg)   SpO2 96%   BMI 35.59 kg/m    CC: med refill visit Subjective:    Patient ID: Stanley Kane, male    DOB: 08/31/1965, 51 y.o.   MRN: [id number]  HPI: Stanley Kane is a 51 y.o. male presenting on 08/14/2017 for Medication Refill   Last seen here 12/2015. No insurance at this time.  Here today requesting paxil refill. Takes 40mg  daily for GAD, tolerates well.  He finds this significantly helps as well.  Denies significant depression.   Sister passed away unexpectedly 3 wks ago in her sleep 11254 yo.   Relevant past medical, surgical, family and social history reviewed and updated as indicated. Interim medical history since our last visit reviewed. Allergies and medications reviewed and updated.  Per HPI unless specifically indicated in ROS section below Review of Systems     Objective:    BP 120/64 (BP Location: Left Arm, Patient Position: Sitting, Cuff Size: Normal)   Pulse 67   Temp 97.9 F (36.6 C) (Oral)   Wt 241 lb (109.3 kg)   SpO2 96%   BMI 35.59 kg/m   Wt Readings from Last 3 Encounters:  08/14/17 241 lb (109.3 kg)  10/17/16 248 lb 12 oz (112.8 kg)  09/18/16 240 lb (108.9 kg)    Physical Exam  Constitutional: He appears well-developed and well-nourished. No distress.  Psychiatric: He has a normal mood and affect. His behavior is normal. Judgment and thought content normal.  Nursing note and vitals reviewed.     Assessment & Plan:   Problem List Items Addressed This Visit    GAD (generalized anxiety disorder) - Primary    Chronic, stable on paxil 40mg  daily which was started 06/2015. Pt desires to continue. Denies significant appetite changes on this medication.       Relevant Medications   PARoxetine (PAXIL) 40 MG tablet    Other Visit Diagnoses    Special screening for malignant neoplasms, colon       Relevant Orders   Fecal occult blood, imunochemical       Follow up plan: No Follow-up on file.  Eustaquio BoydenJavier Artur Winningham, MD

## 2017-08-30 ENCOUNTER — Encounter: Payer: Self-pay | Admitting: Physician Assistant

## 2017-08-30 NOTE — Progress Notes (Signed)
Cardiology Office Note Date:  09/04/2017  Patient ID:  Stanley Kane, DOB May 29, 1966, MRN [id number] PCP:  Eustaquio BoydenGutierrez, Javier, MD  Cardiologist:  Dr. Mariah MillingGollan, MD    Chief Complaint: Follow up Afib  History of Present Illness: Stanley Kane is a 52 y.o. male with history of persistent Afib diagnosed in 2013 s/p DCCV 07/06/2012 with recurrent Afib, obesity with possible OSA, anxiety, and medication noncompliance who presents for follow up of Afib.  Most recent echo from 08/2014 showed an EF of 60-65%, mild MR, mild TR, normal RV systolic function, normal PASP. Prior stress test from 2005 reportedly normal. He was noted to be back in Afib in 2016 in the setting of medication noncompliance. Medications were resumed and he underwent repeat successful DCCV on 11/25/2014. He was noted to be back in Afib at follow up in 12/2014 and has been in Afib at each visit since. He was most recently seen in the office on 10/17/2016 and noted to still be in Afib. At that time, his flecainide was stopped and his metoprolol was increased. He noted atypical chest pain at that time with coronary calcium score being 0 in 09/2016.  He comes in doing well today. Has noted over the past 12 months intermittent flushing with associated sweating at inappropriate times. Never associated with chest pain, palpitations, SOB, nausea, vomiting, dizziness, presyncope, or syncope. 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 Not checking BP or heart rate at home. Reports full compliance with medications. No longer drinking etoh or sodas. Has last ~ 20-30 pounds by his home scale. Not snoring as much per significant other. Interested in alternative therapies for rhythm control.    Past Medical History:  Diagnosis Date  . Fatty liver 06/2014   by abd US  . Mitral regurgitation    a. TTE 2-16: EF 60-65%, mild MR/TR, nl RV sys fxn, nl PASP  . Obesity   . OSA (obstructive sleep apnea)     . Persistent atrial fibrillation (HCC)    a. dx 2013 s/p DCCV; b. recurrent Afib 2016 s/p DCCV 11/25/2014; c. CHADS2VASc 0  . Prediabetes   . 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 CARDIOVASCULAR STRESS TEST  ~2005   [facility]  . carotid us  08/2014   no plaque  . COLONOSCOPY  2000   WNL, for weight loss after started gym  . MRI  08/2014   brain - WNL, skull base C1/2 articulation  . TONSILLECTOMY AND ADENOIDECTOMY  childhood  . US ECHOCARDIOGRAPHY  08/2014   EF 60%, nl vent fxn, mildly dilated LA, mild MR, mild TR    Current Meds  Medication Sig  . furosemide (LASIX) 20 MG tablet TAKE ONE TABLET BY MOUTH EVERY DAY  . metoprolol succinate (TOPROL-XL) 50 MG 24 hr tablet Take 1 tablet (50 mg total) by mouth 2 (two) times daily. Take with or immediately following a meal.  . PARoxetine (PAXIL) 40 MG tablet Take 1 tablet (40 mg total) by mouth every morning.  . rivaroxaban (XARELTO) 20 MG TABS tablet TAKE 1 TABLET (20 MG TOTAL) BY MOUTH DAILY WITH SUPPER.    Allergies:   Lexapro [escitalopram oxalate] and Vicodin [hydrocodone-acetaminophen]   Social History:  The patient  reports that he quit smoking about 21 years ago. he has never used smokeless tobacco. He reports  that he drinks about 1.2 oz of alcohol per week. He reports that he does not use drugs.   Family History:  The patient's family history includes CAD (age of onset: 56) in his sister; Cancer (age of onset: 31) in his father; Coronary artery disease (age of onset: 20) in his father; Diabetes in his mother; Heart attack in his father; Heart disease in his mother; Hypertension in his father.  ROS:   Review of Systems  Constitutional: Positive for malaise/fatigue. Negative for chills, diaphoresis, fever and weight loss.  HENT: Negative for congestion.   Eyes: Negative for discharge and redness.   Respiratory: Positive for shortness of breath. Negative for cough, hemoptysis, sputum production and wheezing.   Cardiovascular: Negative for chest pain, palpitations, orthopnea, claudication, leg swelling and PND.  Gastrointestinal: Negative for abdominal pain, blood in stool, heartburn, melena, nausea and vomiting.  Genitourinary: Negative for hematuria.  Musculoskeletal: Negative for falls and myalgias.  Skin: Negative for rash.  Neurological: Positive for weakness. Negative for dizziness, tingling, tremors, sensory change, speech change, focal weakness and loss of consciousness.  Endo/Heme/Allergies: Does not bruise/bleed easily.  Psychiatric/Behavioral: Negative for substance abuse. The patient is not nervous/anxious.   All other systems reviewed and are negative.    PHYSICAL EXAM:  VS:  BP 100/62 (BP Location: Left Arm, Patient Position: Sitting, Cuff Size: Normal)   Pulse (!) 113   Ht 5\' 9"  (1.753 m)   Wt 241 lb 12 oz (109.7 kg)   BMI 35.70 kg/m  BMI: Body mass index is 35.7 kg/m.  Physical Exam  Constitutional: He is oriented to person, place, and time. He appears well-developed and well-nourished.  HENT:  Head: Normocephalic and atraumatic.  Eyes: Right eye exhibits no discharge. Left eye exhibits no discharge.  Neck: Normal range of motion. No JVD present.  Cardiovascular: Normal rate, S1 normal, S2 normal and normal heart sounds. An irregularly irregular rhythm present. Exam reveals no distant heart sounds, no friction rub, no midsystolic click and no opening snap.  No murmur heard. Pulmonary/Chest: Effort normal and breath sounds normal. No respiratory distress. He has no decreased breath sounds. He has no wheezes. He has no rales. He exhibits no tenderness.  Abdominal: Soft. He exhibits no distension. There is no tenderness.  Musculoskeletal: He exhibits no edema.  Neurological: He is alert and oriented to person, place, and time.  Skin: Skin is warm and dry. No  cyanosis. Nails show no clubbing.  Psychiatric: He has a normal mood and affect. His speech is normal and behavior is normal. Judgment and thought content normal.     EKG:  Was ordered and interpreted by me today. Shows Afib with RVR, 113 bpm, no acute st/t changes  Recent Labs: No results found for requested labs within last 8760 hours.  No results found for requested labs within last 8760 hours.   CrCl cannot be calculated (Patient's most recent lab result is older than the maximum 21 days allowed.).   Wt Readings from Last 3 Encounters:  09/04/17 241 lb 12 oz (109.7 kg)  08/14/17 241 lb (109.3 kg)  10/17/16 248 lb 12 oz (112.8 kg)     Other studies reviewed: Additional studies/records reviewed today include: summarized above  ASSESSMENT AND PLAN:  1. Permanent Afib with RVR: It appears he has been in Afib since mid 2016. He does have a history of a mildly dilated left atrium (43 mm in 2013 by TTE). Previously on flecainide, which was discontinued 09/2016. I do think it  would be worthwhile attempting rhythm control strategy again given his age of only 23. I would like to evaluate for high-risk ischemia or structural heart disease prior to considering a re-loading of flecainide. He is too young for chronic amiodarone therapy. Could consider bisoprolol vs Tikosyn as well. Heart rate in the 90s bpm on exam at rest. His BP precludes titration of Toprol XL at this time. I would like to avoid digoxin use. Check cbc, tsh, cmet, and magnesium. He has been compliant with DOAC. I do not think proceeding with DCCV without AAT would be successful given the prolonged period of Afib as well as with his mildly dilated left atrium. He would benefit from loading of AAT followed by repeat DCCV down the road. Needs sleep study as well. Consider referral to [place] Afib clinic. CHADS2VASc 0. Will continue on DOAC for now as we do plan on proceeding with rhythm control in the near future. For now, continue Xarleto  and Toprol XL.   2. SOB: Likely 2/2 poorly controlled ventricular rates with exertion. Check echo given known mild MR/TR (no mumurs heard on exam). Lexiscan Myoview to evaluate for high-risk ischemia.   3. Sweating: Possibly related to his Paxil. Check tsh, cbc, cmet, magnesium. Schedule stress test (Lexiscan Myoview). No symptoms of palpitations, could consider outpatient cardiac monitoring.   4. Obesity with possible sleep apnea: Continued weight loss advised. No longer drinking etoh or sodas. Kudos on weight loss. Will need to discuss sleep study at follow up visit.   Disposition: F/u with me in 2 weeks.    Current medicines are reviewed at length with the patient today.  The patient did not have any concerns regarding medicines.  Elinor Dodge PA-C 09/04/2017 1:45 PM     [facility] [place] [address] [place], [place] [address] [phone]

## 2017-09-04 ENCOUNTER — Ambulatory Visit (INDEPENDENT_AMBULATORY_CARE_PROVIDER_SITE_OTHER): Payer: Self-pay | Admitting: Physician Assistant

## 2017-09-04 ENCOUNTER — Encounter: Payer: Self-pay | Admitting: Physician Assistant

## 2017-09-04 ENCOUNTER — Other Ambulatory Visit
Admission: RE | Admit: 2017-09-04 | Discharge: 2017-09-04 | Disposition: A | Discharge status: Home / Self Care | Payer: Self-pay | Source: Ambulatory Visit | Attending: Physician Assistant | Admitting: Physician Assistant

## 2017-09-04 VITALS — BP 100/62 | HR 113 | Ht 69.0 in | Wt 241.8 lb

## 2017-09-04 DIAGNOSIS — Z6835 Body mass index (BMI) 35.0-35.9, adult: Secondary | ICD-10-CM

## 2017-09-04 DIAGNOSIS — E6609 Other obesity due to excess calories: Secondary | ICD-10-CM

## 2017-09-04 DIAGNOSIS — R0602 Shortness of breath: Secondary | ICD-10-CM | POA: Insufficient documentation

## 2017-09-04 DIAGNOSIS — Z79899 Other long term (current) drug therapy: Secondary | ICD-10-CM

## 2017-09-04 DIAGNOSIS — I482 Chronic atrial fibrillation: Secondary | ICD-10-CM | POA: Insufficient documentation

## 2017-09-04 DIAGNOSIS — L749 Eccrine sweat disorder, unspecified: Secondary | ICD-10-CM

## 2017-09-04 DIAGNOSIS — I4821 Permanent atrial fibrillation: Secondary | ICD-10-CM

## 2017-09-04 LAB — COMPREHENSIVE METABOLIC PANEL
ALBUMIN: 4.1 g/dL (ref 3.5–5.0)
ALK PHOS: 46 U/L (ref 38–126)
ALT: 34 U/L (ref 17–63)
ANION GAP: 10 (ref 5–15)
AST: 34 U/L (ref 15–41)
BILIRUBIN TOTAL: 0.5 mg/dL (ref 0.3–1.2)
BUN: 17 mg/dL (ref 6–20)
CALCIUM: 9.4 mg/dL (ref 8.9–10.3)
CO2: 25 mmol/L (ref 22–32)
CREATININE: 0.94 mg/dL (ref 0.61–1.24)
Chloride: 100 mmol/L — ABNORMAL LOW (ref 101–111)
GFR calc Af Amer: >60 mL/min (ref >60)
GFR calc non Af Amer: >60 mL/min (ref >60)
GLUCOSE: 88 mg/dL (ref 65–99)
Potassium: 4.1 mmol/L (ref 3.5–5.1)
Sodium: 135 mmol/L (ref 135–145)
TOTAL PROTEIN: 7.7 g/dL (ref 6.5–8.1)

## 2017-09-04 LAB — CBC WITH DIFFERENTIAL/PLATELET
BASOS ABS: 0 10*3/uL (ref 0–0.1)
BASOS PCT: 0 %
EOS ABS: 0.2 10*3/uL (ref 0–0.7)
EOS PCT: 3 %
HCT: 45.8 % (ref 40.0–52.0)
Hemoglobin: 15.5 g/dL (ref 13.0–18.0)
Lymphocytes Relative: 37 %
Lymphs Abs: 2.6 10*3/uL (ref 1.0–3.6)
MCH: 33 pg (ref 26.0–34.0)
MCHC: 33.9 g/dL (ref 32.0–36.0)
MCV: 97.4 fL (ref 80.0–100.0)
MONO ABS: 0.9 10*3/uL (ref 0.2–1.0)
MONOS PCT: 12 %
NEUTROS ABS: 3.3 10*3/uL (ref 1.4–6.5)
Neutrophils Relative %: 48 %
PLATELETS: 321 10*3/uL (ref 150–440)
RBC: 4.7 MIL/uL (ref 4.40–5.90)
RDW: 13.1 % (ref 11.5–14.5)
WBC: 7 10*3/uL (ref 3.8–10.6)

## 2017-09-04 LAB — MAGNESIUM: Magnesium: 2.2 mg/dL (ref 1.7–2.4)

## 2017-09-04 LAB — TSH: TSH: 2.736 u[IU]/mL (ref 0.350–4.500)

## 2017-09-04 NOTE — Patient Instructions (Addendum)
Medication Instructions:  Your physician recommends that you continue on your current medications as directed. Please refer to the Current Medication list given to you today.   Labwork: Your physician recommends that you return for lab work in: TODAY (CBC, CMET, TSH, MG). - Please go to the [facility]. You will check in at the front desk to the right as you walk into the atrium. Valet Parking is offered if needed.    Testing/Procedures: Your physician has requested that you have an echocardiogram. Echocardiography is a painless test that uses sound waves to create images of your heart. It provides your doctor with information about the size and shape of your heart and how well your heart's chambers and valves are working. This procedure takes approximately one hour. There are no restrictions for this procedure.    Your physician has requested that you have a lexiscan myoview. For further information please visit [email or web address] Please follow instruction sheet, as given.  [facility] LEXISCAN MYOVIEW  Your caregiver has ordered a Stress Test with nuclear imaging. The purpose of this test is to evaluate the blood supply to your heart muscle. This procedure is referred to as a "Non-Invasive Stress Test." This is because other than having an IV started in your vein, nothing is inserted or "invades" your body. Cardiac stress tests are done to find areas of poor blood flow to the heart by determining the extent of coronary artery disease (CAD). Some patients exercise on a treadmill, which naturally increases the blood flow to your heart, while others who are  unable to walk on a treadmill due to physical limitations have a pharmacologic/chemical stress agent called Lexiscan . This medicine will mimic walking on a treadmill by temporarily increasing your coronary blood flow.   Please note: these test may take anywhere between 2-4 hours to complete  PLEASE REPORT TO [facility] MEDICAL MALL ENTRANCE   THE VOLUNTEERS AT THE FIRST DESK WILL DIRECT YOU WHERE TO GO  Date of Procedure:______01/09/2019______________  Arrival Time for Procedure:_____07:15 AM___________  Instructions regarding medication:   ____:  Hold other medications as follows:_______FUROSEMIDE________________  PLEASE NOTIFY THE OFFICE AT LEAST 24 HOURS IN ADVANCE IF YOU ARE UNABLE TO KEEP YOUR APPOINTMENT.  [phone] AND  PLEASE NOTIFY NUCLEAR MEDICINE AT [facility] AT LEAST 24 HOURS IN ADVANCE IF YOU ARE UNABLE TO KEEP YOUR APPOINTMENT. [phone]  How to prepare for your Myoview test:  1. Do not eat or drink after midnight 2. No caffeine for 24 hours prior to test 3. No smoking 24 hours prior to test. 4. Your medication may be taken with water.  If your doctor stopped a medication because of this test, do not take that medication. 5. Ladies, please do not wear dresses.  Skirts or pants are appropriate. Please wear a short sleeve shirt. 6. No perfume, cologne or lotion. 7. Wear comfortable walking shoes. No heels!    Follow-Up: Your physician recommends that you schedule a follow-up appointment in: 2 WEEKS WITH RYAN DUNN (OK TO ADD ON).   If you need a refill on your cardiac medications before your next appointment, please call your pharmacy.   Echocardiogram An echocardiogram, or echocardiography, uses sound waves (ultrasound) to produce an image of your heart. The echocardiogram is simple, painless, obtained within a short period of time, and offers valuable information to your health care provider. The images from an echocardiogram can provide information such as:  Evidence of coronary artery disease (CAD).  Heart size.  Heart muscle function.  Heart valve function.  Aneurysm detection.  Evidence of a past heart attack.  Fluid buildup around the heart.  Heart muscle thickening.  Assess heart valve function.  Tell a health care provider about:  Any allergies you have.  All medicines you  are taking, including vitamins, herbs, eye drops, creams, and over-the-counter medicines.  Any problems you or family members have had with anesthetic medicines.  Any blood disorders you have.  Any surgeries you have had.  Any medical conditions you have.  Whether you are pregnant or may be pregnant. What happens before the procedure? No special preparation is needed. Eat and drink normally. What happens during the procedure?  In order to produce an image of your heart, gel will be applied to your chest and a wand-like tool (transducer) will be moved over your chest. The gel will help transmit the sound waves from the transducer. The sound waves will harmlessly bounce off your heart to allow the heart images to be captured in real-time motion. These images will then be recorded.  You may need an IV to receive a medicine that improves the quality of the pictures. What happens after the procedure? You may return to your normal schedule including diet, activities, and medicines, unless your health care provider tells you otherwise. This information is not intended to replace advice given to you by your health care provider. Make sure you discuss any questions you have with your health care provider. Document Released: 08/12/2000 Document Revised: 04/02/2016 Document Reviewed: 04/22/2013 Elsevier Interactive Patient Education  2017 Elsevier Inc.     Cardiac Nuclear Scan A cardiac nuclear scan is a test that measures blood flow to the heart when a person is resting and when he or she is exercising. The test looks for problems such as:  Not enough blood reaching a portion of the heart.  The heart muscle not working normally.  You may need this test if:  You have heart disease.  You have had abnormal lab results.  You have had heart surgery or angioplasty.  You have chest pain.  You have shortness of breath.  In this test, a radioactive dye (tracer) is injected into your  bloodstream. After the tracer has traveled to your heart, an imaging device is used to measure how much of the tracer is absorbed by or distributed to various areas of your heart. This procedure is usually done at a hospital and takes 2-4 hours. Tell a health care provider about:  Any allergies you have.  All medicines you are taking, including vitamins, herbs, eye drops, creams, and over-the-counter medicines.  Any problems you or family members have had with the use of anesthetic medicines.  Any blood disorders you have.  Any surgeries you have had.  Any medical conditions you have.  Whether you are pregnant or may be pregnant. What are the risks? Generally, this is a safe procedure. However, problems may occur, including:  Serious chest pain and heart attack. This is only a risk if the stress portion of the test is done.  Rapid heartbeat.  Sensation of warmth in your chest. This usually passes quickly.  What happens before the procedure?  Ask your health care provider about changing or stopping your regular medicines. This is especially important if you are taking diabetes medicines or blood thinners.  Remove your jewelry on the day of the procedure. What happens during the procedure?  An IV tube will be inserted into one of your veins.  Your health care  provider will inject a small amount of radioactive tracer through the tube.  You will wait for 20-40 minutes while the tracer travels through your bloodstream.  Your heart activity will be monitored with an electrocardiogram (ECG).  You will lie down on an exam table.  Images of your heart will be taken for about 15-20 minutes.  You may be asked to exercise on a treadmill or stationary bike. While you exercise, your heart's activity will be monitored with an ECG, and your blood pressure will be checked. If you are unable to exercise, you may be given a medicine to increase blood flow to parts of your heart.  When  blood flow to your heart has peaked, a tracer will again be injected through the IV tube.  After 20-40 minutes, you will get back on the exam table and have more images taken of your heart.  When the procedure is over, your IV tube will be removed. The procedure may vary among health care providers and hospitals. Depending on the type of tracer used, scans may need to be repeated 3-4 hours later. What happens after the procedure?  Unless your health care provider tells you otherwise, you may return to your normal schedule, including diet, activities, and medicines.  Unless your health care provider tells you otherwise, you may increase your fluid intake. This will help flush the contrast dye from your body. Drink enough fluid to keep your urine clear or pale yellow.  It is up to you to get your test results. Ask your health care provider, or the department that is doing the test, when your results will be ready. Summary  A cardiac nuclear scan measures the blood flow to the heart when a person is resting and when he or she is exercising.  You may need this test if you are at risk for heart disease.  Tell your health care provider if you are pregnant.  Unless your health care provider tells you otherwise, increase your fluid intake. This will help flush the contrast dye from your body. Drink enough fluid to keep your urine clear or pale yellow. This information is not intended to replace advice given to you by your health care provider. Make sure you discuss any questions you have with your health care provider. Document Released: 09/09/2004 Document Revised: 08/17/2016 Document Reviewed: 07/24/2013 Elsevier Interactive Patient Education  2017 [employer]

## 2017-09-06 ENCOUNTER — Ambulatory Visit
Admission: RE | Admit: 2017-09-06 | Discharge: 2017-09-06 | Disposition: A | Discharge status: Home / Self Care | Payer: Self-pay | Source: Ambulatory Visit | Attending: Physician Assistant | Admitting: Physician Assistant

## 2017-09-06 DIAGNOSIS — R0602 Shortness of breath: Secondary | ICD-10-CM | POA: Insufficient documentation

## 2017-09-06 DIAGNOSIS — R9439 Abnormal result of other cardiovascular function study: Secondary | ICD-10-CM | POA: Insufficient documentation

## 2017-09-06 LAB — NM MYOCAR MULTI W/SPECT W/WALL MOTION / EF
CSEPHR: 72 %
LV dias vol: 81 mL (ref 62–150)
LV sys vol: 45 mL
NUC STRESS TID: 1.1
Peak HR: 123 {beats}/min
Rest HR: 86 {beats}/min

## 2017-09-06 MED ORDER — TECHNETIUM TC 99M TETROFOSMIN IV KIT
31.0410 | PACK | Freq: Once | INTRAVENOUS | Status: AC | PRN
  Administered 2017-09-06: 31.041 via INTRAVENOUS

## 2017-09-06 MED ORDER — TECHNETIUM TC 99M TETROFOSMIN IV KIT
13.0000 | PACK | Freq: Once | INTRAVENOUS | Status: AC | PRN
  Administered 2017-09-06: 13.927 via INTRAVENOUS

## 2017-09-06 MED ORDER — REGADENOSON 0.4 MG/5ML IV SOLN
0.4000 mg | Freq: Once | INTRAVENOUS | Status: AC
  Administered 2017-09-06: 0.4 mg via INTRAVENOUS

## 2017-09-08 ENCOUNTER — Other Ambulatory Visit: Payer: Self-pay

## 2017-09-19 ENCOUNTER — Ambulatory Visit: Payer: Self-pay | Admitting: Physician Assistant

## 2017-09-28 ENCOUNTER — Emergency Department
Admission: EM | Admit: 2017-09-28 | Discharge: 2017-09-28 | Disposition: A | Discharge status: Home / Self Care | Payer: Self-pay | Attending: Emergency Medicine | Admitting: Emergency Medicine

## 2017-09-28 ENCOUNTER — Other Ambulatory Visit: Payer: Self-pay

## 2017-09-28 DIAGNOSIS — Z79899 Other long term (current) drug therapy: Secondary | ICD-10-CM | POA: Insufficient documentation

## 2017-09-28 DIAGNOSIS — F1410 Cocaine abuse, uncomplicated: Secondary | ICD-10-CM | POA: Insufficient documentation

## 2017-09-28 DIAGNOSIS — Z7901 Long term (current) use of anticoagulants: Secondary | ICD-10-CM | POA: Insufficient documentation

## 2017-09-28 DIAGNOSIS — I4891 Unspecified atrial fibrillation: Secondary | ICD-10-CM | POA: Insufficient documentation

## 2017-09-28 DIAGNOSIS — Z87891 Personal history of nicotine dependence: Secondary | ICD-10-CM | POA: Insufficient documentation

## 2017-09-28 LAB — CBC WITH DIFFERENTIAL/PLATELET
Basophils Absolute: 0.1 10*3/uL (ref 0–0.1)
Basophils Relative: 1 %
EOS ABS: 0.3 10*3/uL (ref 0–0.7)
EOS PCT: 5 %
HCT: 43.4 % (ref 40.0–52.0)
Hemoglobin: 14.7 g/dL (ref 13.0–18.0)
LYMPHS ABS: 2.3 10*3/uL (ref 1.0–3.6)
LYMPHS PCT: 31 %
MCH: 32.6 pg (ref 26.0–34.0)
MCHC: 33.9 g/dL (ref 32.0–36.0)
MCV: 96.3 fL (ref 80.0–100.0)
MONO ABS: 0.9 10*3/uL (ref 0.2–1.0)
Monocytes Relative: 12 %
Neutro Abs: 3.8 10*3/uL (ref 1.4–6.5)
Neutrophils Relative %: 51 %
PLATELETS: 309 10*3/uL (ref 150–440)
RBC: 4.51 MIL/uL (ref 4.40–5.90)
RDW: 13.2 % (ref 11.5–14.5)
WBC: 7.4 10*3/uL (ref 3.8–10.6)

## 2017-09-28 LAB — BASIC METABOLIC PANEL
Anion gap: 10 (ref 5–15)
BUN: 11 mg/dL (ref 6–20)
CALCIUM: 9.2 mg/dL (ref 8.9–10.3)
CO2: 24 mmol/L (ref 22–32)
CREATININE: 0.89 mg/dL (ref 0.61–1.24)
Chloride: 105 mmol/L (ref 101–111)
GFR calc Af Amer: >60 mL/min (ref >60)
GLUCOSE: 103 mg/dL — AB (ref 65–99)
Potassium: 3.9 mmol/L (ref 3.5–5.1)
SODIUM: 139 mmol/L (ref 135–145)

## 2017-09-28 LAB — TROPONIN I: Troponin I: <0.03 ng/mL (ref <0.03)

## 2017-09-28 MED ORDER — DILTIAZEM HCL 25 MG/5ML IV SOLN
15.0000 mg | Freq: Once | INTRAVENOUS | Status: AC
  Administered 2017-09-28: 15 mg via INTRAVENOUS
  Filled 2017-09-28: qty 5

## 2017-09-28 MED ORDER — SODIUM CHLORIDE 0.9 % IV BOLUS (SEPSIS)
1000.0000 mL | Freq: Once | INTRAVENOUS | Status: AC
  Administered 2017-09-28: 1000 mL via INTRAVENOUS

## 2017-09-28 NOTE — ED Provider Notes (Signed)
[facility] Emergency Department Provider Note   ____________________________________________   I have reviewed the triage vital signs and the nursing notes.   HISTORY  Chief Complaint Tachycardia   History limited by: Not Limited   HPI Stanley Kane is a 52 y.o. male who presents to the emergency department today by EMS because of concern for fast heart rate. Patient states that he has a history of atrial fibrillation. Is on medication for this. States he has been abusing cocaine recently. Has history of cocaine abuse. Did not take his prescribed medication this morning. No chest pain. No shortness of breath. Patient does have interest in stopping cocaine use.   Per medical record review patient has a history of atrial fibrillation.  Past Medical History:  Diagnosis Date  . Fatty liver 06/2014   by abd [place]  . Mitral regurgitation    a. TTE 2-16: EF 60-65%, mild MR/TR, nl RV sys fxn, nl PASP  . Obesity   . OSA (obstructive sleep apnea)   . Persistent atrial fibrillation (HCC)    a. dx 2013 s/p DCCV; b. recurrent Afib 2016 s/p DCCV 11/25/2014; c. CHADS2VASc 0  . Prediabetes   . 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 Chest pain 10/17/2016  . Health maintenance examination 09/07/2015  . Concussion without loss of consciousness 03/25/2015  . Carpal tunnel syndrome 01/29/2015  . Obstructive sleep apnea 12/29/2014  . Left wrist pain 11/24/2014  . Pain of hand and fingers 11/24/2014  . Transient global amnesia 10/05/2014  . 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 Fatty liver 06/29/2014  . Generalized abdominal pain 06/16/2014  . Prediabetes 06/16/2014  . Body mass index (BMI) of 35.0 to 35.9 with comorbidity 06/02/2014  . Shortness of breath 05/22/2012  . Snoring 04/13/2012  . Paroxysmal atrial fibrillation (HCC) 04/09/2012  . GAD  (generalized anxiety disorder) 04/09/2012    Past Surgical History:  Procedure Laterality Date  . CARDIOVASCULAR STRESS TEST  ~2005   thinks at [facility], told normal  . carotid US  08/2014   no plaque  . COLONOSCOPY  2000   WNL, for weight loss after started gym  . MRI  08/2014   brain - WNL, skull base C1/2 articulation  . TONSILLECTOMY AND ADENOIDECTOMY  childhood  . US ECHOCARDIOGRAPHY  08/2014   EF 60%, nl vent fxn, mildly dilated LA, mild MR, mild TR    Prior to Admission medications   Medication Sig Start Date End Date Taking? Authorizing Provider  furosemide (LASIX) 20 MG tablet TAKE ONE TABLET BY MOUTH EVERY DAY 07/27/17   Antonieta Iba, MD  metoprolol succinate (TOPROL-XL) 50 MG 24 hr tablet Take 1 tablet (50 mg total) by mouth 2 (two) times daily. Take with or immediately following a meal. 10/17/16 10/17/17  Antonieta Iba, MD  PARoxetine (PAXIL) 40 MG tablet Take 1 tablet (40 mg total) by mouth every morning. 08/14/17   [name], MD  rivaroxaban (XARELTO) 20 MG TABS tablet TAKE 1 TABLET (20 MG TOTAL) BY MOUTH DAILY WITH SUPPER. 10/17/16   Gollan, Tollie Pizza, MD    Allergies Lexapro [escitalopram oxalate] and Vicodin [hydrocodone-acetaminophen]  Family History  Problem Relation Age of Onset  . Hypertension Father   . Cancer Father 56       liver  . Coronary artery disease Father 94  . Heart attack Father   .  Diabetes Mother   . Heart disease Mother   . CAD Sister [id number]       deceased from MI  . Stroke Neg Hx     Social History Social History   Tobacco Use  . Smoking status: Former Smoker    Last attempt to quit: 08/29/1996    Years since quitting: 21.0  . Smokeless tobacco: Never Used  Substance Use Topics  . Alcohol use: Yes    Alcohol/week: 1.2 oz    Types: 2 Cans of beer per week    Comment: Occasional  . Drug use: Yes    Frequency: 5.0 times per week    Types: Cocaine    Comment: 3-5 grams a day     Review of Systems Constitutional: No  fever/chills Eyes: No visual changes. ENT: No sore throat. Cardiovascular: Denies chest pain. Positive for fast heart rate. Respiratory: Denies shortness of breath. Gastrointestinal: No abdominal pain.  No nausea, no vomiting.  No diarrhea.   Genitourinary: Negative for dysuria. Musculoskeletal: Negative for back pain. Skin: Negative for rash. Neurological: Negative for headaches, focal weakness or numbness.  ____________________________________________   PHYSICAL EXAM:  VITAL SIGNS: ED Triage Vitals [09/28/17 1238]  Enc Vitals Group     BP (!) 136/110     Pulse Rate (!) 156     Resp 19     Temp 98.3 F (36.8 C)     Temp Source Oral     SpO2 99 %     Weight 230 lb (104.3 kg)     Height 5\' 9"  (1.753 m)   Constitutional: Alert and oriented. Well appearing and in no distress. Eyes: Conjunctivae are normal.  ENT   Head: Normocephalic and atraumatic.   Nose: No congestion/rhinnorhea.   Mouth/Throat: Mucous membranes are moist.   Neck: No stridor. Hematological/Lymphatic/Immunilogical: No cervical lymphadenopathy. Cardiovascular: Tachycardic, irregular rhythm.  No murmurs, rubs, or gallops.  Respiratory: Normal respiratory effort without tachypnea nor retractions. Breath sounds are clear and equal bilaterally. No wheezes/rales/rhonchi. Gastrointestinal: Soft and non tender. No rebound. No guarding.  Genitourinary: Deferred Musculoskeletal: Normal range of motion in all extremities. No lower extremity edema. Neurologic:  Normal speech and language. No gross focal neurologic deficits are appreciated.  Skin:  Skin is warm, dry and intact. No rash noted. Psychiatric: Mood and affect are normal. Speech and behavior are normal. Patient exhibits appropriate insight and judgment.  ____________________________________________    LABS (pertinent positives/negatives)  Trop <0.03 BMP glu 103 otherwise wnl CBC  wnl  ____________________________________________   EKG  I, Phineas SemenGraydon Hilaria Titsworth, attending physician, personally viewed and interpreted this EKG  EKG Time: 1232 Rate: 148 Rhythm: atrial fibrillation with RVR Axis: normal Intervals: qtc 471 QRS: narrow ST changes: no st elevation Impression: abnormal ekg  ____________________________________________    RADIOLOGY  None  ____________________________________________   PROCEDURES  Procedures  ____________________________________________   INITIAL IMPRESSION / ASSESSMENT AND PLAN / ED COURSE  Pertinent labs & imaging results that were available during my care of the patient were reviewed by me and considered in my medical decision making (see chart for details).  Presented to the emergency department today because of concerns for fast heart rate.  Patient does have a history of atrial fibrillation.  Patient additionally says that he has been abusing cocaine.  Initial heart rate was elevated however did improve after diltiazem.  Patient asymptomatic blood work without any signs of cardiac ischemia.  I had a discussion with the patient about importance of drug cessation.  Discussed  that we will give him resources.  Patient does state that he would like to stop his cocaine use.   ____________________________________________   FINAL CLINICAL IMPRESSION(S) / ED DIAGNOSES  Final diagnoses:  Atrial fibrillation with rapid ventricular response (HCC)  Cocaine abuse (HCC)     Note: This dictation was prepared with Dragon dictation. Any transcriptional errors that result from this process are unintentional     Phineas Semen, MD 09/28/17 1445

## 2017-09-28 NOTE — ED Triage Notes (Signed)
Pt presents today via [facility] for tachycardia. Pt has hx of AFIB and is Xerolto daily. Pt has done cocaine for the last 12 days last time was 12 hours ago. Pt is asymptomatic.  Pt presents with 18g in L AC.

## 2017-09-28 NOTE — Discharge Instructions (Signed)
Please seek medical attention for any high fevers, chest pain, shortness of breath, change in behavior, persistent vomiting, bloody stool or any other new or concerning symptoms.  

## 2017-10-16 ENCOUNTER — Other Ambulatory Visit: Payer: Self-pay

## 2017-10-20 ENCOUNTER — Other Ambulatory Visit: Payer: Self-pay | Admitting: Cardiovascular Disease

## 2017-10-20 NOTE — Telephone Encounter (Signed)
Please review for refill, Thanks !  

## 2017-11-13 ENCOUNTER — Other Ambulatory Visit: Payer: Self-pay

## 2017-11-13 DIAGNOSIS — R0989 Other specified symptoms and signs involving the circulatory and respiratory systems: Secondary | ICD-10-CM

## 2017-11-17 ENCOUNTER — Other Ambulatory Visit: Payer: Self-pay | Admitting: Cardiovascular Disease

## 2017-11-20 ENCOUNTER — Telehealth: Payer: Self-pay | Admitting: Cardiovascular Disease

## 2017-11-20 NOTE — Telephone Encounter (Signed)
-----   Message from Margrett RudBrittany N Slayton, [place] sent at 11/17/2017  3:08 PM EDT ----- Regarding: Patietn needs an appointment  Can you try to schedule an appointment with Dr. Mariah MillingGollan. Patient was to follow up in 2 weeks after stress test and ECHO. Patient no showed and cancelled every ECHO that was scheduled.

## 2017-11-20 NOTE — Telephone Encounter (Signed)
Lmov for patient to call back and schedule Dr Mariah MillingGollan appointment  He is scheduled for Echo

## 2017-11-21 NOTE — Telephone Encounter (Signed)
Spoke with patient and reviewed importance of getting echocardiogram in order to get the DOT clearance. Also confirmed upcoming appointments and advised to bring any paperwork that we need to complete. He verbalized understanding of our conversation and had no further questions at this time.

## 2017-11-21 NOTE — Telephone Encounter (Signed)
Patient called to schedule appt . 4/2 with Gollan .  He declined to rs echo sooner than ov.  He requests a letter to take to dot physical.

## 2017-11-23 ENCOUNTER — Ambulatory Visit (INDEPENDENT_AMBULATORY_CARE_PROVIDER_SITE_OTHER): Payer: Self-pay

## 2017-11-23 ENCOUNTER — Other Ambulatory Visit: Payer: Self-pay

## 2017-11-23 DIAGNOSIS — R0602 Shortness of breath: Secondary | ICD-10-CM

## 2017-11-23 DIAGNOSIS — I482 Chronic atrial fibrillation: Secondary | ICD-10-CM

## 2017-11-23 DIAGNOSIS — I4821 Permanent atrial fibrillation: Secondary | ICD-10-CM

## 2017-11-26 NOTE — Progress Notes (Signed)
Cardiology Office Note  Date:  11/28/2017   ID:  Stanley Kane, DOB June 26, 1966, MRN [id number]  PCP:  [name], MD   Chief Complaint  Patient presents with  . other    OD f/u DOT forms to be filled out . Meds reviewed verbally with pt.    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 Sleep apnea noted during anesthesia of previous cardioversion  who presents for routine followup for permanent atrial fibrillation.    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 Stress test reviewed personally by myself showing no perfusion abnormality Mildly depressed ejection fraction secondary to atrial fibrillation with elevated rate  Echocardiogram ordered to confirm ejection fraction  reviewed personally by myself Date of study November 23, 2017 Ejection fraction estimated 45 to 50% indicating stress test was falsely depressed/artifact  Good exercise tolerance Denies any significant chest pain Compliant with his anticoagulation  Reports that he is lost 40 pounds by changing his diet  EKG personally reviewed by myself on todays visit Shows atrial fibrillation with elevated rate 129 bpm no significant ST or T wave changes  Other past medical history On a earlier visit in 2016, he hasn't taking his anticoagulation and flecainide 150 mg twice a day, he had converted back to atrial fibrillation, was relatively asymptomatic He underwent cardioversion as it felt it was affecting him in some  ways, such as his sex life. This was done on 11/25/2014. Normal sinus rhythm successfully restored. He was maintained on anticoagulation, flecainide, Lasix daily, metoprolol.  He was scheduled for cardioversion December 2015 and on arrival was in normal sinus rhythm. Prior to that had been started on flecainide. Cardioversion was canceled.  Wife reports he had a period of amnesia after she woke him from a deep sleep. Went to the hospital, workup was unremarkable. Unable to exclude hypercapnia as he is not on CPAP. He and his wife are hoping to avoid sleep studies and CPAP His weight has been trending upwards, eating poorly. Not exercising  Significant stress and anxiety when driving his stroke in the [place] area . Wife reports he has anger issues when driving . He previously went for routine DOT physical and this was denied as he was in atrial fibrillation and not on anticoagulation .   Prior Echocardiogram showed ejection fraction 50%, mild dilatation of the left atrium, no significant valvular disease  He reports having a stress test at [facility] 6 years ago. This was reportedly normal.  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  PSH:    Past Surgical History:  Procedure Laterality Date  . CARDIOVASCULAR STRESS TEST  ~2005   thinks at [facility], told normal  . carotid US  08/2014   no plaque  . COLONOSCOPY  2000   WNL, for weight loss after started gym  . MRI  08/2014   brain - WNL, skull base C1/2 articulation  .  TONSILLECTOMY AND ADENOIDECTOMY  childhood  . US ECHOCARDIOGRAPHY  08/2014   EF 60%, nl vent fxn, mildly dilated LA, mild MR, mild TR    Current Outpatient Medications  Medication Sig Dispense Refill  . furosemide (LASIX) 20 MG tablet TAKE ONE TABLET BY MOUTH EVERY DAY 30 tablet 3  . metoprolol  succinate (TOPROL-XL) 50 MG 24 hr tablet TAKE ONE TABLET BY MOUTH TWICE DAILY. TAKE WITH OR IMMEDIATELY FOLLOWING A MEAL 30 tablet 0  . PARoxetine (PAXIL) 40 MG tablet Take 1 tablet (40 mg total) by mouth every morning. 90 tablet 3  . XARELTO 20 MG TABS tablet TAKE ONE TABLET BY MOUTH EVERY DAY WITH SUPPER 90 tablet 3   No current facility-administered medications for this visit.      Allergies:   Lexapro [escitalopram oxalate] and Vicodin [hydrocodone-acetaminophen]   Social History:  The patient  reports that he quit smoking about 21 years ago. He has never used smokeless tobacco. He reports that he drinks about 1.2 oz of alcohol per week. He reports that he has current or past drug history. Drug: Cocaine. Frequency: 5.00 times per week.   Family History:   family history includes CAD (age of onset: 6154) in his sister; Cancer (age of onset: 4474) in his father; Coronary artery disease (age of onset: 4030) in his father; Diabetes in his mother; Heart attack in his father; Heart disease in his mother; Hypertension in his father.    Review of Systems: Review of Systems  Constitutional: Negative.   Respiratory: Positive for cough.   Cardiovascular: Negative.   Gastrointestinal: Negative.   Musculoskeletal: Negative.   Neurological: Negative.   Psychiatric/Behavioral: Negative.   All other systems reviewed and are negative.    PHYSICAL EXAM: VS:  BP (!) 104/57 (BP Location: Left Arm, Patient Position: Sitting, Cuff Size: Normal)   Pulse (!) 129   Ht 5\' 9"  (1.753 m)   Wt 232 lb 4 oz (105.3 kg)   BMI 34.30 kg/m  , BMI Body mass index is 34.3 kg/m. Constitutional:  oriented to person, place, and time. No distress.  HENT:  Head: Normocephalic and atraumatic.  Eyes:  no discharge. No scleral icterus.  Neck: Normal range of motion. Neck supple. No JVD present.  Cardiovascular: Irregularly irregular, rapid rate normal heart sounds and intact distal pulses. Exam reveals no gallop and no  friction rub. No edema No murmur heard. Pulmonary/Chest: Effort normal and breath sounds normal. No stridor. No respiratory distress.  no wheezes.  no rales.  no tenderness.  Abdominal: Soft.  no distension.  no tenderness.  Musculoskeletal: Normal range of motion.  no  tenderness or deformity.  Neurological:  normal muscle tone. Coordination normal. No atrophy Skin: Skin is warm and dry. No rash noted. not diaphoretic.  Psychiatric:  normal mood and affect. behavior is normal. Thought content normal.     Recent Labs: 09/04/2017: ALT 34; Magnesium 2.2; TSH 2.736 09/28/2017: BUN 11; Creatinine, Ser 0.89; Hemoglobin 14.7; Platelets 309; Potassium 3.9; Sodium 139    Lipid Panel Lab Results  Component Value Date   CHOL 177 09/08/2015   HDL 49.80 09/08/2015   LDLCALC 103 (H) 09/08/2015   TRIG 124.0 09/08/2015      Wt Readings from Last 3 Encounters:  11/28/17 232 lb 4 oz (105.3 kg)  09/28/17 230 lb (104.3 kg)  09/04/17 241 lb 12 oz (109.7 kg)       ASSESSMENT AND PLAN:  Persistent atrial fibrillation (HCC) - Plan: EKG 12-Lead Recommended  he restart his metoprolol succinate 50 mg daily We will restart digoxin 2 pills today followed by 0.25 mg daily for rate control Stressed compliance of his anticoagulation Repeat EKG on Friday to confirm rate control and 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 heart rate is controlled  GAD (generalized anxiety disorder) Significant stress at home, truck problems, wife was sick  Shortness of breath Denies significant shortness of breath only some coughing in the setting of recent upper respiratory tract infection  Chest pain, unspecified type No significant chest pain noted even on exertion Recent stress test with no perfusion  abnormality No cardiac catheterization needed   Total encounter time more than 45 minutes  Greater than 50% was spent in counseling and coordination of care with the patient   Disposition:   F/U  6 months EKG on Friday   Orders Placed This Encounter  Procedures  . EKG 12-Lead     Signed, Dossie Arbour, M.D., Ph.D. 11/28/2017  [facility] [place], [place] [phone]

## 2017-11-28 ENCOUNTER — Ambulatory Visit (INDEPENDENT_AMBULATORY_CARE_PROVIDER_SITE_OTHER): Payer: Self-pay | Admitting: Cardiovascular Disease

## 2017-11-28 ENCOUNTER — Encounter: Payer: Self-pay | Admitting: Cardiovascular Disease

## 2017-11-28 VITALS — BP 104/57 | HR 129 | Ht 69.0 in | Wt 232.2 lb

## 2017-11-28 DIAGNOSIS — I481 Persistent atrial fibrillation: Secondary | ICD-10-CM

## 2017-11-28 DIAGNOSIS — R079 Chest pain, unspecified: Secondary | ICD-10-CM

## 2017-11-28 DIAGNOSIS — F411 Generalized anxiety disorder: Secondary | ICD-10-CM

## 2017-11-28 DIAGNOSIS — I4819 Other persistent atrial fibrillation: Secondary | ICD-10-CM

## 2017-11-28 DIAGNOSIS — R0602 Shortness of breath: Secondary | ICD-10-CM

## 2017-11-28 DIAGNOSIS — I420 Dilated cardiomyopathy: Secondary | ICD-10-CM

## 2017-11-28 MED ORDER — METOPROLOL SUCCINATE ER 50 MG PO TB24: 50.0000 mg | ORAL_TABLET | Freq: Every day | ORAL | 3 refills | Status: DC

## 2017-11-28 MED ORDER — DIGOXIN 250 MCG PO TABS: 0.2500 mg | ORAL_TABLET | Freq: Every day | ORAL | 3 refills | Status: DC

## 2017-11-28 NOTE — Patient Instructions (Addendum)
Medication Instructions:  Your physician has recommended you make the following change in your medication:  1. RESTART Metoprolol Succinate 50 mg once daily in the AM 2. START Digoxin 0.25 mg once daily. (Take 2 the first day then go to once daily)   Labwork:  No new labs needed  Testing/Procedures:  No further testing at this time  Follow-Up: Nurse Visit for EKG check on Friday.   It was a pleasure seeing you in the office today. Please call us if you have new issues that need to be addressed before your next appt.  [phone]  Your physician wants you to follow-up in: 12 months.  You will receive a reminder letter in the mail two months in advance. If you don't receive a letter, please call our office to schedule the follow-up appointment.  If you need a refill on your cardiac medications before your next appointment, please call your pharmacy.  For educational health videos Log in to : [email or web address] Or : [email or web address], password : [email or web address]

## 2017-11-29 ENCOUNTER — Emergency Department: Payer: Self-pay

## 2017-11-29 ENCOUNTER — Other Ambulatory Visit: Payer: Self-pay

## 2017-11-29 ENCOUNTER — Emergency Department
Admission: EM | Admit: 2017-11-29 | Discharge: 2017-11-29 | Disposition: A | Discharge status: Home / Self Care | Payer: Self-pay | Attending: Emergency Medicine | Admitting: Emergency Medicine

## 2017-11-29 DIAGNOSIS — Z87891 Personal history of nicotine dependence: Secondary | ICD-10-CM | POA: Insufficient documentation

## 2017-11-29 DIAGNOSIS — Z79899 Other long term (current) drug therapy: Secondary | ICD-10-CM | POA: Insufficient documentation

## 2017-11-29 DIAGNOSIS — J209 Acute bronchitis, unspecified: Secondary | ICD-10-CM | POA: Insufficient documentation

## 2017-11-29 LAB — INFLUENZA PANEL BY PCR (TYPE A & B)
Influenza A By PCR: NEGATIVE
Influenza B By PCR: NEGATIVE

## 2017-11-29 MED ORDER — BENZONATATE 100 MG PO CAPS: 100.0000 mg | ORAL_CAPSULE | Freq: Three times a day (TID) | ORAL | 0 refills | Status: AC | PRN

## 2017-11-29 MED ORDER — BENZONATATE 100 MG PO CAPS
100.0000 mg | ORAL_CAPSULE | Freq: Once | ORAL | Status: AC
  Administered 2017-11-29: 100 mg via ORAL
  Filled 2017-11-29: qty 1

## 2017-11-29 MED ORDER — AZITHROMYCIN 500 MG PO TABS: 500.0000 mg | ORAL_TABLET | Freq: Once | ORAL | Status: DC

## 2017-11-29 MED ORDER — AMOXICILLIN-POT CLAVULANATE 875-125 MG PO TABS
1.0000 | ORAL_TABLET | Freq: Once | ORAL | Status: AC
  Administered 2017-11-29: 1 via ORAL
  Filled 2017-11-29: qty 1

## 2017-11-29 MED ORDER — AMOXICILLIN-POT CLAVULANATE 875-125 MG PO TABS: 1.0000 | ORAL_TABLET | Freq: Two times a day (BID) | ORAL | 0 refills | Status: AC

## 2017-11-29 NOTE — ED Provider Notes (Addendum)
[facility] Emergency Department Provider Note   First MD Initiated Contact with Patient 11/29/17 0315     (approximate)  I have reviewed the triage vital signs and the nursing notes.   HISTORY  Chief Complaint Cough    HPI Stanley Kane is a 52 y.o. male resents to the emergency department with a one-week history of nonproductive cough associated with dyspnea intermittently.  Patient admits to subjective fevers and chills.  Patient states multiple sick contact at home with the same.  Patient admits to a remote history smoking 30 years ago.   Past Medical History:  Diagnosis Date  . Fatty liver 06/2014   by abd [place]  . Mitral regurgitation    a. TTE 2-16: EF 60-65%, mild MR/TR, nl RV sys fxn, nl PASP  . Obesity   . OSA (obstructive sleep apnea)   . Persistent atrial fibrillation (HCC)    a. dx 2013 s/p DCCV; b. recurrent Afib 2016 s/p DCCV 11/25/2014; c. CHADS2VASc 0  . Prediabetes   . 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 Chest pain 10/17/2016  . Health maintenance examination 09/07/2015  . Concussion without loss of consciousness 03/25/2015  . Carpal tunnel syndrome 01/29/2015  . Obstructive sleep apnea 12/29/2014  . Left wrist pain 11/24/2014  . Pain of hand and fingers 11/24/2014  . Transient global amnesia 10/05/2014  . 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 Fatty liver 06/29/2014  . Generalized abdominal pain 06/16/2014  . Prediabetes 06/16/2014  . Body mass index (BMI) of 35.0 to 35.9 with comorbidity 06/02/2014  . Shortness of breath 05/22/2012  . Snoring 04/13/2012  . Paroxysmal atrial fibrillation (HCC) 04/09/2012  . GAD (generalized anxiety disorder) 04/09/2012    Past Surgical History:  Procedure Laterality Date  . CARDIOVASCULAR STRESS TEST  ~2005   thinks at [facility], told normal  . carotid US  08/2014   no  plaque  . COLONOSCOPY  2000   WNL, for weight loss after started gym  . MRI  08/2014   brain - WNL, skull base C1/2 articulation  . TONSILLECTOMY AND ADENOIDECTOMY  childhood  . US ECHOCARDIOGRAPHY  08/2014   EF 60%, nl vent fxn, mildly dilated LA, mild MR, mild TR    Prior to Admission medications   Medication Sig Start Date End Date Taking? Authorizing Provider  amoxicillin-clavulanate (AUGMENTIN) 875-125 MG tablet Take 1 tablet by mouth 2 (two) times daily for 10 days. 11/29/17 12/09/17  Darci Current, MD  benzonatate (TESSALON PERLES) 100 MG capsule Take 1 capsule (100 mg total) by mouth 3 (three) times daily as needed for up to 10 days for cough. 11/29/17 12/09/17  Darci Current, MD  digoxin (LANOXIN) 0.25 MG tablet Take 1 tablet (0.25 mg total) by mouth daily. 11/28/17   Antonieta Iba, MD  furosemide (LASIX) 20 MG tablet TAKE ONE TABLET BY MOUTH EVERY DAY 07/27/17   Antonieta Iba, MD  metoprolol succinate (TOPROL-XL) 50 MG 24 hr tablet Take 1 tablet (50 mg total) by mouth daily. Take with or immediately following a meal. 11/28/17 02/26/18  Antonieta Iba, MD  PARoxetine (PAXIL) 40 MG tablet Take 1 tablet (40 mg total) by mouth every morning. 08/14/17   [name], MD  XARELTO 20 MG TABS tablet TAKE ONE TABLET BY MOUTH EVERY DAY WITH SUPPER 10/20/17   Julien Nordmann  J, MD    Allergies Lexapro [escitalopram oxalate] and Vicodin [hydrocodone-acetaminophen]  Family History  Problem Relation Age of Onset  . Hypertension Father   . Cancer Father 71       liver  . Coronary artery disease Father 59  . Heart attack Father   . Diabetes Mother   . Heart disease Mother   . CAD Sister 81       deceased from MI  . Stroke Neg Hx     Social History Social History   Tobacco Use  . Smoking status: Former Smoker    Last attempt to quit: 08/29/1996    Years since quitting: 21.2  . Smokeless tobacco: Never Used  Substance Use Topics  . Alcohol use: Yes    Alcohol/week: 1.2  oz    Types: 2 Cans of beer per week    Comment: Occasional  . Drug use: Yes    Frequency: 5.0 times per week    Types: Cocaine    Comment: 3-5 grams a day     Review of Systems Constitutional: Positive for subjective fever/chills Eyes: No visual changes. ENT: No sore throat. Cardiovascular: Denies chest pain. Respiratory: Denies shortness of breath.  Positive for cough Gastrointestinal: No abdominal pain.  No nausea, no vomiting.  No diarrhea.  No constipation. Genitourinary: Negative for dysuria. Musculoskeletal: Negative for neck pain.  Negative for back pain. Integumentary: Negative for rash. Neurological: Negative for headaches, focal weakness or numbness.   ____________________________________________   PHYSICAL EXAM:  VITAL SIGNS: ED Triage Vitals  Enc Vitals Group     BP 11/29/17 0254 (!) 98/48     Pulse Rate 11/29/17 0254 88     Resp 11/29/17 0254 18     Temp 11/29/17 0254 98.1 F (36.7 C)     Temp Source 11/29/17 0254 Oral     SpO2 11/29/17 0254 96 %     Weight 11/29/17 0255 99.8 kg (220 lb)     Height 11/29/17 0255 1.753 m (5\' 9" )     Head Circumference --      Peak Flow --      Pain Score 11/29/17 0255 3     Pain Loc --      Pain Edu? --      Excl. in GC? --     Constitutional: Alert and oriented.  Actively coughing  eyes: Conjunctivae are normal. Head: Atraumatic. Mouth/Throat: Mucous membranes are moist.  Oropharynx non-erythematous. Neck: No stridor.   Cardiovascular: Normal rate, regular rhythm. Good peripheral circulation. Grossly normal heart sounds. Respiratory: Normal respiratory effort.  No retractions. Lungs CTAB. Gastrointestinal: Soft and nontender. No distention.  Musculoskeletal: No lower extremity tenderness nor edema. No gross deformities of extremities. Neurologic:  Normal speech and language. No gross focal neurologic deficits are appreciated.  Skin:  Skin is warm, dry and intact. No rash noted. Psychiatric: Mood and affect are  normal. Speech and behavior are normal.  _____________________  RADIOLOGY I, Angus N Cloma Rahrig, personally viewed and evaluated these images (plain radiographs) as part of my medical decision making, as well as reviewing the written report by the radiologist.  ED MD interpretation: Bronchial inflammation possible bronchitis.  Official radiology report(s): Dg Chest 2 View  Result Date: 11/29/2017 CLINICAL DATA:  Nonproductive cough for 3 days EXAM: CHEST - 2 VIEW COMPARISON:  Chest x-ray 09/25/2014, CT 10/17/2016 FINDINGS: Mild diffuse bronchitic changes. No consolidation or effusion. Normal heart size. No pneumothorax. IMPRESSION: 1. Mild central airways accentuation suggesting bronchial inflammation. 2. No  focal pulmonary infiltrate. Electronically Signed   By: Jasmine PangKim  Fujinaga M.D.   On: 11/29/2017 03:18    ED ECG REPORT I, [place] N April Colter, the attending physician, personally viewed and interpreted this ECG.   Date: 11/29/2017  EKG Time: 3:13 AM  Rate: 112  Rhythm: atrial fibrillation with rapid ventricular response  Axis: normal  Intervals:irregular RR interval.  ST&T Change: none  Procedures   ____________________________________________   INITIAL IMPRESSION / ASSESSMENT AND PLAN / ED COURSE  As part of my medical decision making, I reviewed the following data within the electronic MEDICAL RECORD NUMBER  52 year old male presented with history of physical exam consistent with acute otitis versus pneumonia chest x-ray revealed no evidence of pneumonia however consistent with bronchial inflammation.  Patient given azithromycin and Tessalon the emergency department will be prescribed the same for home ____________________________________________  FINAL CLINICAL IMPRESSION(S) / ED DIAGNOSES  Final diagnoses:  Acute bronchitis, unspecified organism     MEDICATIONS GIVEN DURING THIS VISIT:  Medications  amoxicillin-clavulanate (AUGMENTIN) 875-125 MG per tablet 1 tablet (has no  administration in time range)  benzonatate (TESSALON) capsule 100 mg (100 mg Oral Given 11/29/17 0340)     ED Discharge Orders        Ordered    benzonatate (TESSALON PERLES) 100 MG capsule  3 times daily PRN     11/29/17 0408    amoxicillin-clavulanate (AUGMENTIN) 875-125 MG tablet  2 times daily     11/29/17 0408       Note:  This document was prepared using Dragon voice recognition software and may include unintentional dictation errors.    Darci CurrentBrown, [place] N, MD 11/29/17 [id number]    Darci CurrentBrown, [place] N, MD 12/15/17 [phone]

## 2017-11-29 NOTE — ED Triage Notes (Signed)
Pt in with co cough for 3 days states nonproductive, feels shob at times.

## 2017-12-01 ENCOUNTER — Telehealth: Payer: Self-pay | Admitting: *Deleted

## 2017-12-01 ENCOUNTER — Ambulatory Visit (INDEPENDENT_AMBULATORY_CARE_PROVIDER_SITE_OTHER): Payer: Self-pay | Admitting: *Deleted

## 2017-12-01 VITALS — BP 95/65 | HR 87 | Temp 97.2°F | Ht 69.0 in | Wt 222.8 lb

## 2017-12-01 DIAGNOSIS — I480 Paroxysmal atrial fibrillation: Secondary | ICD-10-CM

## 2017-12-01 NOTE — Progress Notes (Signed)
1.) Reason for visit: EKG check  2.) Name of MD requesting visit: Gollan  3.) H&P: atrial fibrillation  4.) ROS related to problem: Patient here today for EKG check and heart rate check. Patient has no complaints today. Denies chest pain, shortness of breath, swelling or dizziness. Patient reports having bronchitis and is being treated on an antibiotic. 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 There is a form he needs Dr Mariah MillingGollan to sign if appropriate.  5.) Assessment and plan per MD: EKG performed. Dr Mariah MillingGollan in heart cath at this time. Message sent to Dr Mariah MillingGollan asking him to review EKG and CDL paperwork afterwards.   Patient verbalized understanding to continue current medications at this time and that I will call him with any changes and once paperwork completed if appropriate.

## 2017-12-01 NOTE — Telephone Encounter (Signed)
Dr Mariah MillingGollan completed patient's CDL paperwork. Faxed to [phone] Willeen CassKatherine D'Jernes, FNP.  No answer with patient. Left message that faxed and will leave copy at front desk for him to pick up at his earliest convenience.

## 2017-12-12 NOTE — Telephone Encounter (Signed)
Form given to [facility], RN for Dr. Mariah MillingGollan.

## 2017-12-12 NOTE — Telephone Encounter (Signed)
Patient came by office and dropped off another DOT clearance form for [facility] Fax to [phone] Call when completed

## 2017-12-13 NOTE — Telephone Encounter (Signed)
Left voicemail message that forms are pending Dr. Windell HummingbirdGollan's review and that he is out of the office today but that once they are done I would give him a call to pick up with our number to call if questions.

## 2017-12-13 NOTE — Telephone Encounter (Signed)
Patient came by Would like to speak with a nurse to explain the situation of the forms and to get a time frame of when it may be completed Please call

## 2017-12-15 NOTE — Telephone Encounter (Signed)
Left voicemail message that DOT form has been completed and ready for him to pick up with instructions to call if he has any questions.

## 2017-12-18 NOTE — Telephone Encounter (Signed)
Spoke with patient and advised form is completed and ready for pick up. He reports that he is on his way here to pick up form. He was appreciative for the call with no further questions at this time.

## 2017-12-19 ENCOUNTER — Other Ambulatory Visit: Payer: Self-pay

## 2017-12-25 ENCOUNTER — Other Ambulatory Visit: Payer: Self-pay | Admitting: Cardiovascular Disease

## 2018-05-04 NOTE — Telephone Encounter (Signed)
Nursing was cleaning out letters that have not been picked up from our front desk. The patient's DOT form was still there. Form mailed to the patient's current mailing address on file.  [address] [place], [place] [address]

## 2018-05-21 NOTE — Telephone Encounter (Signed)
Letter returned to sender .  Scanned to Epic media if needed for future reference.

## 2018-06-25 ENCOUNTER — Other Ambulatory Visit: Payer: Self-pay | Admitting: Cardiovascular Disease

## 2018-08-25 ENCOUNTER — Other Ambulatory Visit: Payer: Self-pay | Admitting: Family Medicine

## 2018-09-27 ENCOUNTER — Telehealth: Payer: Self-pay | Admitting: Cardiovascular Disease

## 2018-09-27 NOTE — Telephone Encounter (Signed)
Patient dropped off DOT clearance form .   Placed in nurse box .  Please call new cell when ready [phone]

## 2018-09-27 NOTE — Telephone Encounter (Signed)
Form placed in "To Do" bin above desk for review of requirements

## 2018-09-27 NOTE — Telephone Encounter (Signed)
Given to [employer] Freida Busman, RN.

## 2018-10-02 NOTE — Telephone Encounter (Signed)
Patient calling to check on status.

## 2018-10-02 NOTE — Telephone Encounter (Signed)
Patient calling to check status of clearance .  Please call.

## 2018-10-03 NOTE — Telephone Encounter (Signed)
Scheduled ov 

## 2018-10-03 NOTE — Telephone Encounter (Signed)
Message sent to scheduling to assist with making appointment for patient to come in for his DOT forms. Patient was seen last year 11/27/2017. Stress test from 09/06/2017 showed reduced EF, echo 11/23/17 45-50% EF with afib rate of 100-120. EKG 12/01/17 showed rate 87. DOT requirements require adequate rate/rhythm control so will have him come in for follow up to evaluate.

## 2018-10-05 NOTE — Progress Notes (Signed)
Cardiology Office Note Date:  10/08/2018  Patient ID:  Stanley PoagWesley E Kane, DOB 06/26/1966, MRN [id number] PCP:  Eustaquio BoydenGutierrez, Javier, MD  Cardiologist:  Dr. Mariah MillingGollan, MD    Chief Complaint: Follow up  History of Present Illness: Stanley ChihuahuaWesley E Kane is a 53 y.o. male with history of permanent Afib diagnosed in 2013 s/p DCCV 07/06/2012 with recurrent Afib s/p repeat DCCV on 11/25/2014, obesity with possible OSA, anxiety, and prior medication noncompliance who presents for follow up of Afib.  Prior echo from 08/2014 showed an EF of 60-65%, mild MR, mild TR, normal RVSF and PASP. Prior stress test from 2005 was reportedly normal. He was noted to be back in Afib in 2016 in the setting of medication noncompliance. Following resumption of medications, he underwent successful DCCV on 11/25/2014. He was again noted to be back in Afib in 12/2014 and has remained in Afib in subsequent office visits leading to the discontinuation of flecainide and rate control strategy in 09/2016. He underwent calcium scoring in 09/2016 given atypical chest pain with a score of 0. I saw the patient in 08/2017 for follow up and he reported compliance with his medications. We discussed consideration of rhythm control given his age. In this setting, he underwent nuclear stress testing to evaluate for high risk ischemia that was negative for evidence of ischemia or scar. EF was reduced, felt to be underestimated given Afib. We followed this up with an echo in 10/2017 that showed an EF of 45-50%, no RWMA, mildly dilated left atrium measuring 42 mm, RVSF and PASP were normal.   He was seen in the ED in 08/2017 for palpitations in the setting of reported cocaine abuse (no drug screen results in Epic). He was rate controlled and discharged to outpatient follow up.  He reported interest in quitting cocaine at that time.  He was most recently seen in our office by Dr. Mariah MillingGollan in 11/2017 for follow up. He had ran out of metoprolol and was noted to be tachycardic  into the 120s bpm. He reported compliance with his DOAC. His metoprolol and digoxin were restarted. Follow up RN visit showed improved rate control with ventricular rate of 87 bpm.   He comes in doing well today.  He denies any chest pain, shortness of breath, dizziness, presyncope, or syncope.  No falls, BRBPR, or melena.  He is compliant with all medications.  He has lost 35 pounds since he was last seen which has been intentional through eating a healthier diet and exercise.  He feels much better overall.  He reports no further cocaine use.  He has cut out all alcohol.  No lower extremity swelling, abdominal distention, orthopnea, PND, or early satiety.   Past Medical History:  Diagnosis Date  . Fatty liver 06/2014   by abd US  . Mitral regurgitation    a. TTE 2-16: EF 60-65%, mild MR/TR, nl RV sys fxn, nl PASP  . Obesity   . OSA (obstructive sleep apnea)   . Persistent atrial fibrillation    a. dx 2013 s/p DCCV; b. recurrent Afib 2016 s/p DCCV 11/25/2014; c. CHADS2VASc 0  . Prediabetes   . 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 CARDIOVASCULAR STRESS TEST  ~2005   [facility], told normal  . carotid us  08/2014   no plaque  . COLONOSCOPY  2000   WNL, for weight loss after started  gym  . MRI  08/2014   brain - WNL, skull base C1/2 articulation  . TONSILLECTOMY AND ADENOIDECTOMY  childhood  . US ECHOCARDIOGRAPHY  08/2014   EF 60%, nl vent fxn, mildly dilated LA, mild MR, mild TR    Current Meds  Medication Sig  . digoxin (LANOXIN) 0.25 MG tablet Take 1 tablet (0.25 mg total) by mouth daily.  . furosemide (LASIX) 20 MG tablet TAKE ONE TABLET BY MOUTH EVERY DAY  . metoprolol succinate (TOPROL-XL) 50 MG 24 hr tablet Take 1 tablet (50 mg total) by mouth daily. Take with or immediately following a meal.  . PARoxetine (PAXIL) 40 MG tablet TAKE ONE TABLET BY MOUTH EVERY MORNING    . XARELTO 20 MG TABS tablet TAKE ONE TABLET BY MOUTH EVERY DAY WITH SUPPER    Allergies:   Lexapro [escitalopram oxalate] and Vicodin [hydrocodone-acetaminophen]   Social History:  The patient  reports that he quit smoking about 22 years ago. He has never used smokeless tobacco. He reports current alcohol use of about 2.0 standard drinks of alcohol per week. He reports current drug use. Frequency: 5.00 times per week. Drug: Cocaine.   Family History:  The patient's family history includes CAD (age of onset: 74) in his sister; Cancer (age of onset: 34) in his father; Coronary artery disease (age of onset: 49) in his father; Diabetes in his mother; Heart attack in his father; Heart disease in his mother; Hypertension in his father.  ROS:   Review of Systems  Constitutional: Positive for weight loss. Negative for chills, diaphoresis, fever and malaise/fatigue.  HENT: Negative for congestion.   Eyes: Negative for discharge and redness.  Respiratory: Negative for cough, hemoptysis, sputum production, shortness of breath and wheezing.   Cardiovascular: Negative for chest pain, palpitations, orthopnea, claudication, leg swelling and PND.  Gastrointestinal: Negative for abdominal pain, blood in stool, heartburn, melena, nausea and vomiting.  Genitourinary: Negative for hematuria.  Musculoskeletal: Negative for falls and myalgias.  Skin: Negative for rash.  Neurological: Negative for dizziness, tingling, tremors, sensory change, speech change, focal weakness, loss of consciousness and weakness.  Endo/Heme/Allergies: Does not bruise/bleed easily.  Psychiatric/Behavioral: Negative for substance abuse. The patient is not nervous/anxious.   All other systems reviewed and are negative.    PHYSICAL EXAM:  VS:  BP (!) 84/60 (BP Location: Left Arm, Patient Position: Sitting, Cuff Size: Normal)   Pulse 67   Ht 5\' 9"  (1.753 m)   Wt 197 lb (89.4 kg)   BMI 29.09 kg/m  BMI: Body mass index is 29.09  kg/m.   Recheck blood pressure of 101/69.  Physical Exam  Constitutional: He is oriented to person, place, and time. He appears well-developed and well-nourished.  HENT:  Head: Normocephalic and atraumatic.  Eyes: Right eye exhibits no discharge. Left eye exhibits no discharge.  Neck: Normal range of motion. No JVD present.  Cardiovascular: Normal rate, S1 normal, S2 normal and normal heart sounds. An irregularly irregular rhythm present. Exam reveals no distant heart sounds, no friction rub, no midsystolic click and no opening snap.  No murmur heard. Pulses:      Posterior tibial pulses are 2+ on the right side and 2+ on the left side.  Pulmonary/Chest: Effort normal and breath sounds normal. No respiratory distress. He has no decreased breath sounds. He has no wheezes. He has no rales. He exhibits no tenderness.  Abdominal: Soft. He exhibits no distension. There is no abdominal tenderness.  Musculoskeletal:  General: No edema.  Neurological: He is alert and oriented to person, place, and time.  Skin: Skin is warm and dry. No cyanosis. Nails show no clubbing.  Psychiatric: He has a normal mood and affect. His speech is normal and behavior is normal. Judgment and thought content normal.     EKG:  Was ordered and interpreted by me today. Shows A. fib, 67 bpm, no acute ST-T changes  Recent Labs: No results found for requested labs within last 8760 hours.  No results found for requested labs within last 8760 hours.   CrCl cannot be calculated (Patient's most recent lab result is older than the maximum 21 days allowed.).   Wt Readings from Last 3 Encounters:  10/08/18 197 lb (89.4 kg)  12/01/17 222 lb 12 oz (101 kg)  11/29/17 220 lb (99.8 kg)     Other studies reviewed: Additional studies/records reviewed today include: summarized above  ASSESSMENT AND PLAN:  1. Permanent A. Fib: Ventricular rates are well controlled.  He is asymptomatic.  Decrease Toprol-XL to 25 mg  daily secondary to relative hypotension.  Continue digoxin 0.25 mg daily.  Continue Xarelto 20 mg with dinner.  Check digoxin level, BMP, and CBC.  From an A. 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  2. Cardiomyopathy: Felt to be tachycardia mediated in the setting of A. fib with RVR.  He appears well compensated and euvolemic on exam today.  Change Lasix from scheduled to as needed for shortness of breath, lower extremity swelling, or weight gain of greater than 3 pounds overnight.  Patient should not need stress testing every 2 years as part of his DOT license given he has never had an MI or PCI.  3. History of cocaine abuse: No documented evidence of this in Epic.  Patient denies any further use.  Defer to PCP and DOT medical examiner.  4. Hypotension: Improved on recheck with a BP of 101/69.  Asymptomatic.  Decrease Toprol-XL to 25 mg daily.  Change Lasix to as needed.  5. Obesity with possible OSA: Weight is down 35 pounds from his most recent visit.  Not currently on CPAP for suspected OSA.  Defer to PCP.  Disposition: F/u with Dr. Mariah Milling or an APP in 6 months.  Current medicines are reviewed at length with the patient today.  The patient did not have any concerns regarding medicines.  Signed, [name], PA-C 10/08/2018 10:12 AM     [facility] [place] [address] [place], [place] [address] [phone]

## 2018-10-08 ENCOUNTER — Encounter: Payer: Self-pay | Admitting: Physician Assistant

## 2018-10-08 ENCOUNTER — Ambulatory Visit (INDEPENDENT_AMBULATORY_CARE_PROVIDER_SITE_OTHER): Payer: Self-pay | Admitting: Physician Assistant

## 2018-10-08 VITALS — BP 84/60 | HR 67 | Ht 69.0 in | Wt 197.0 lb

## 2018-10-08 DIAGNOSIS — Z6835 Body mass index (BMI) 35.0-35.9, adult: Secondary | ICD-10-CM

## 2018-10-08 DIAGNOSIS — I4821 Permanent atrial fibrillation: Secondary | ICD-10-CM

## 2018-10-08 DIAGNOSIS — F1411 Cocaine abuse, in remission: Secondary | ICD-10-CM

## 2018-10-08 DIAGNOSIS — E6609 Other obesity due to excess calories: Secondary | ICD-10-CM

## 2018-10-08 DIAGNOSIS — I959 Hypotension, unspecified: Secondary | ICD-10-CM

## 2018-10-08 DIAGNOSIS — R000 Tachycardia, unspecified: Secondary | ICD-10-CM

## 2018-10-08 DIAGNOSIS — I43 Cardiomyopathy in diseases classified elsewhere: Secondary | ICD-10-CM

## 2018-10-08 MED ORDER — FUROSEMIDE 20 MG PO TABS: 20.0000 mg | ORAL_TABLET | ORAL | 3 refills | Status: DC | PRN

## 2018-10-08 MED ORDER — METOPROLOL SUCCINATE ER 25 MG PO TB24: 25.0000 mg | ORAL_TABLET | Freq: Every day | ORAL | 3 refills | Status: DC

## 2018-10-08 NOTE — Patient Instructions (Signed)
Medication Instructions:  DECREASE the Metoprolol (Toprol) to 25 mg daily CHANGE how you take the Furosemide to 20 mg as needed for swelling  If you need a refill on your cardiac medications before your next appointment, please call your pharmacy.   Lab work: Your provider would like for you to have the following labs today: CBC, BMET and Digoxin  If you have labs (blood work) drawn today and your tests are completely normal, you will receive your results only by: Marland Kitchen MyChart Message (if you have MyChart) OR . A paper copy in the mail If you have any lab test that is abnormal or we need to change your treatment, we will call you to review the results.  Testing/Procedures: None ordered  Follow-Up: At [facility], you and your health needs are our priority.  As part of our continuing mission to provide you with exceptional heart care, we have created designated Provider Care Teams.  These Care Teams include your primary Cardiologist (physician) and Advanced Practice Providers (APPs -  Physician Assistants and Nurse Practitioners) who all work together to provide you with the care you need, when you need it. You will need a follow up appointment in 6 months.  Please call our office 2 months in advance to schedule this appointment.  You may see Dr. Mariah Milling or one of the following Advanced Practice Providers on your designated Care Team:   Nicolasa Ducking, NP [name], PA-C . Marisue Ivan, PA-C

## 2018-10-09 ENCOUNTER — Telehealth: Payer: Self-pay | Admitting: Physician Assistant

## 2018-10-09 DIAGNOSIS — I4821 Permanent atrial fibrillation: Secondary | ICD-10-CM

## 2018-10-09 DIAGNOSIS — Z79899 Other long term (current) drug therapy: Secondary | ICD-10-CM

## 2018-10-09 LAB — BASIC METABOLIC PANEL
BUN / CREAT RATIO: 12 (ref 9–20)
BUN: 13 mg/dL (ref 6–24)
CO2: 23 mmol/L (ref 20–29)
CREATININE: 1.12 mg/dL (ref 0.76–1.27)
Calcium: 9.2 mg/dL (ref 8.7–10.2)
Chloride: 99 mmol/L (ref 96–106)
GFR calc non Af Amer: 76 mL/min/{1.73_m2} (ref >59)
GFR, EST AFRICAN AMERICAN: 87 mL/min/{1.73_m2} (ref >59)
Glucose: 81 mg/dL (ref 65–99)
Potassium: 4.8 mmol/L (ref 3.5–5.2)
Sodium: 139 mmol/L (ref 134–144)

## 2018-10-09 LAB — CBC
HEMATOCRIT: 44.4 % (ref 37.5–51.0)
HEMOGLOBIN: 15.6 g/dL (ref 13.0–17.7)
MCH: 32.2 pg (ref 26.6–33.0)
MCHC: 35.1 g/dL (ref 31.5–35.7)
MCV: 92 fL (ref 79–97)
Platelets: 354 10*3/uL (ref 150–450)
RBC: 4.84 x10E6/uL (ref 4.14–5.80)
RDW: 13.4 % (ref 11.6–15.4)
WBC: 7.6 10*3/uL (ref 3.4–10.8)

## 2018-10-09 LAB — DIGOXIN LEVEL: Digoxin, Serum: 2.1 ng/mL (ref 0.5–0.9)

## 2018-10-09 MED ORDER — DIGOXIN 250 MCG PO TABS: 0.1250 mg | ORAL_TABLET | Freq: Every day | ORAL | Status: DC

## 2018-10-09 NOTE — Telephone Encounter (Signed)
I spoke with the patient regarding his results. He is aware of [name], PA's recommendations to:  - hold digoxin x 1 week (he has not taken this today, so he will start holding today) - restart digoxin in 1 week at 0.125 mg once daily - recheck digoxin level in 2 weeks  The patient voices understanding of the above. He will come to the office on 2/25 at 10:15 am for a repeat Digoxin level.

## 2018-10-09 NOTE — Telephone Encounter (Signed)
Notes recorded by Sondra Bargesunn, Ryan M, PA-C on 10/09/2018 at 7:11 AM EST Digoxin is too high. He needs to hold this for 1 week, then decrease digoxin to 0.125 mg daily. Recheck digoxin level in 2 weeks. Would ideally like to use alternative rate controlling medication, though BP precludes escalation of beta blocker.  Renal function is near his baseline.  Potassium is at goal.  Blood count is normal.

## 2018-10-23 ENCOUNTER — Other Ambulatory Visit: Payer: Self-pay

## 2018-11-06 ENCOUNTER — Other Ambulatory Visit: Payer: Self-pay | Admitting: Cardiovascular Disease

## 2018-11-06 NOTE — Telephone Encounter (Signed)
I recommend not refilling his digoxin given prior elevated level with noncompliance with follow up. If Xarelto refill is needed, a 1 time refill is ok with follow up needed.

## 2018-11-06 NOTE — Telephone Encounter (Signed)
Please advise if ok to refill Digoxin request.

## 2018-11-06 NOTE — Telephone Encounter (Signed)
Refill Request.  

## 2018-11-06 NOTE — Telephone Encounter (Signed)
Call attempted to patient in regards to digoxin refill.  No vm available on all numbers provided.   Pt had toxic dig level at last OV. It was requested that he decrease dosage and come in for repeat labs. He has not done so at this time.   Of note pt was also hypotensive at last OV. I would be interested in speaking with him about how BP have been since.   I refused Rx at this time and called [facility] pharmacy to flag his account to call our office.   Routed to provider to further advise if needed.

## 2018-11-07 MED ORDER — RIVAROXABAN 20 MG PO TABS: ORAL_TABLET | ORAL | 0 refills | Status: DC

## 2018-11-07 NOTE — Telephone Encounter (Signed)
Received message back from provider to refill 1 month of xarelto. Rx sent.   Advised pharmacy to have patient call the office for further refills.   Attempted to call patient, no vm available.

## 2018-11-08 ENCOUNTER — Other Ambulatory Visit
Admission: RE | Admit: 2018-11-08 | Discharge: 2018-11-08 | Disposition: A | Discharge status: Home / Self Care | Payer: Self-pay | Attending: Physician Assistant | Admitting: Physician Assistant

## 2018-11-08 ENCOUNTER — Other Ambulatory Visit: Payer: Self-pay

## 2018-11-08 DIAGNOSIS — Z79899 Other long term (current) drug therapy: Secondary | ICD-10-CM

## 2018-11-08 LAB — DIGOXIN LEVEL: Digoxin Level: 0.4 ng/mL — ABNORMAL LOW (ref 0.8–2.0)

## 2018-11-08 MED ORDER — FUROSEMIDE 20 MG PO TABS: 20.0000 mg | ORAL_TABLET | ORAL | 0 refills | Status: DC | PRN

## 2018-11-08 NOTE — Telephone Encounter (Signed)
*  STAT* If patient is at the pharmacy, call can be transferred to refill team.   1. Which medications need to be refilled? (please list name of each medication and dose if known) digoxin  2. Which pharmacy/location (including street and city if local pharmacy) is medication to be sent to? [facility]  3. Do they need a 30 day or 90 day supply? 90

## 2018-11-08 NOTE — Telephone Encounter (Signed)
Pt called regarding digoxin refill, indicated to him that he is past due on dig labs. Pt called to office to get that scheduled.   Dig level order placed. Pt reports he will go to the lab today.   30 day supply sent fo lasix.   He is scheduled for f/u 4/15 with [name], PA

## 2018-11-09 ENCOUNTER — Telehealth: Payer: Self-pay

## 2018-11-09 MED ORDER — DIGOXIN 125 MCG PO TABS: 0.1250 mg | ORAL_TABLET | Freq: Every day | ORAL | 0 refills | Status: DC

## 2018-11-09 NOTE — Telephone Encounter (Signed)
Call to patient to discuss results from dig level. We confirmed upcoming appt and I sent in 30 day supply to pt preferred pharmacy.   No further questions at this time.   Advised pt to call for any further questions or concerns.

## 2018-11-20 ENCOUNTER — Telehealth: Payer: Self-pay | Admitting: Cardiovascular Disease

## 2018-11-20 NOTE — Telephone Encounter (Signed)
°*  STAT* If patient is at the pharmacy, call can be transferred to refill team.   1. Which medications need to be refilled? (please list name of each medication and dose if known)  meotprolol succinate 25 MG 1 tablet daily   2. Which pharmacy/location (including street and city if local pharmacy) is medication to be sent to? [facility]   3. Do they need a 30 day or 90 day supply? 90 day

## 2018-11-27 ENCOUNTER — Other Ambulatory Visit: Payer: Self-pay | Admitting: Cardiovascular Disease

## 2018-11-29 ENCOUNTER — Telehealth: Payer: Self-pay

## 2018-11-29 NOTE — Telephone Encounter (Signed)
Virtual Visit Pre-Appointment Phone Call  Steps For Call:  1. Confirm consent - "In the setting of the current Covid19 crisis, you are scheduled for a phone visit with your provider on 12/12/2018 at 11:30AM.  Just as we do with many in-office visits, in order for you to participate in this visit, we must obtain consent.  If you'd like, I can send this to your mychart (if signed up) or email for you to review.  Otherwise, I can obtain your verbal consent now.  All virtual visits are billed to your insurance company just like a normal visit would be.  By agreeing to a virtual visit, we'd like you to understand that the technology does not allow for your provider to perform an examination, and thus may limit your provider's ability to fully assess your condition.  Finally, though the technology is pretty good, we cannot assure that it will always work on either your or our end, and in the setting of a video visit, we may have to convert it to a phone-only visit.  In either situation, we cannot ensure that we have a secure connection.  Are you willing to proceed?"  2. Give patient instructions for WebEx download to smartphone as below if video visit  3. Advise patient to be prepared with any vital sign or heart rhythm information, their current medicines, and a piece of paper and pen handy for any instructions they may receive the day of their visit  4. Inform patient they will receive a phone call 15 minutes prior to their appointment time (may be from unknown caller ID) so they should be prepared to answer  5. Confirm that appointment type is correct in Epic appointment notes (video vs telephone)    TELEPHONE CALL NOTE  Stanley Kane has been deemed a candidate for a follow-up tele-health visit to limit community exposure during the Covid-19 pandemic. I spoke with the patient via phone to ensure availability of phone/video source, confirm preferred email & phone number, and discuss instructions  and expectations.  I reminded Stanley Kane to be prepared with any vital sign and/or heart rhythm information that could potentially be obtained via home monitoring, at the time of his visit. I reminded Stanley Kane to expect a phone call at the time of his visit if his visit.  Did the patient verbally acknowledge consent to treatment? YES  Andi Devon 11/29/2018 10:41 AM  CONSENT FOR TELE-HEALTH VISIT - PLEASE REVIEW  I hereby voluntarily request, consent and authorize [facility] and its employed or contracted physicians, physician assistants, nurse practitioners or other licensed health care professionals (the Practitioner), to provide me with telemedicine health care services (the Services") as deemed necessary by the treating Practitioner. I acknowledge and consent to receive the Services by the Practitioner via telemedicine. I understand that the telemedicine visit will involve communicating with the Practitioner through live audiovisual communication technology and the disclosure of certain medical information by electronic transmission. I acknowledge that I have been given the opportunity to request an in-person assessment or other available alternative prior to the telemedicine visit and am voluntarily participating in the telemedicine visit.  I understand that I have the right to withhold or withdraw my consent to the use of telemedicine in the course of my care at any time, without affecting my right to future care or treatment, and that the Practitioner or I may terminate the telemedicine visit at any time. I understand that I have  the right to inspect all information obtained and/or recorded in the course of the telemedicine visit and may receive copies of available information for a reasonable fee.  I understand that some of the potential risks of receiving the Services via telemedicine include:   Delay or interruption in medical evaluation due to technological  equipment failure or disruption;  Information transmitted may not be sufficient (e.g. poor resolution of images) to allow for appropriate medical decision making by the Practitioner; and/or   In rare instances, security protocols could fail, causing a breach of personal health information.  Furthermore, I acknowledge that it is my responsibility to provide information about my medical history, conditions and care that is complete and accurate to the best of my ability. I acknowledge that Practitioner's advice, recommendations, and/or decision may be based on factors not within their control, such as incomplete or inaccurate data provided by me or distortions of diagnostic images or specimens that may result from electronic transmissions. I understand that the practice of medicine is not an exact science and that Practitioner makes no warranties or guarantees regarding treatment outcomes. I acknowledge that I will receive a copy of this consent concurrently upon execution via email to the email address I last provided but may also request a printed copy by calling the office of [facility].    I understand that my insurance will be billed for this visit.   I have read or had this consent read to me.  I understand the contents of this consent, which adequately explains the benefits and risks of the Services being provided via telemedicine.   I have been provided ample opportunity to ask questions regarding this consent and the Services and have had my questions answered to my satisfaction.  I give my informed consent for the services to be provided through the use of telemedicine in my medical care  By participating in this telemedicine visit I agree to the above.

## 2018-12-11 NOTE — Progress Notes (Addendum)
Telehealth Visit     Evaluation Performed:  Follow-up visit  This visit type was conducted due to national recommendations for restrictions regarding the COVID-19 Pandemic (e.g. social distancing) in an effort to limit this patient's exposure and mitigate transmission in our community.  Due to his co-morbid illnesses, this patient is at least at moderate risk for complications without adequate follow up.  This format is felt to be most appropriate for this patient at this time.  The patient did not have access to video technology/had technical difficulties with video requiring transitioning to audio format only (telephone).  All issues noted in this document were discussed and addressed.  No physical exam could be performed with this format.  Please refer to the patient's chart for his  consent to telehealth for Stanley Kane.   Date:  12/12/2018   ID:  Stanley Kane, DOB [birth year], MRN [id number]  Patient Location:  [address] [place] [address]   Provider location:   Stanley Kane, [place] office  PCP:  [name], MD  Cardiologist:  Stanley Kane [facility]  Chief Complaint:  Atrial fib   History of Present Illness:    Stanley Kane is a 53 y.o. male who presents via audio/video conferencing for a telehealth visit today.   The patient does not symptoms concerning for COVID-19 infection (fever, chills, cough, or new SHORTNESS OF BREATH).   Patient has a past medical history of permanent Afib diagnosed in 2013  s/p DCCV 07/06/2012 with recurrent Afib s/p repeat DCCV on 11/25/2014 on Xarelto,  obesity withpossibleOSA,  anxiety,  prior medication noncompliance. Who presents for follow-up of his atrial fibrillation   in the office in 09/2018 and was doing well. He had lost 35 pounds compliance with medications and denied any further cocaine use.   digoxin level on 11/08/2018 improved at 0.4.   Afib in 2016 in the setting of  medication noncompliance.  successful DCCV on 11/25/2014.    back in Afib in 12/2014 and has remained in Afib in subsequent office visits leading to the discontinuation of flecainide and rate control strategy in 09/2016.   He was seen in the ED in 08/2017 for palpitations in the setting of reported cocaine abuse (no drug screen results in Epic). He was rate controlled and discharged to outpatient follow up.  He reported interest in quitting cocaine at that time.  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   Prior stress test from 2005 was reportedly normal.   calcium scoring in 09/2016 given atypical chest pain with a score of 0   nuclear stress testing that was negative for evidence of ischemia or scar.  EF was reduced, felt to be underestimated given Afib. We followed this up with an echo in 10/2017 that showed an EF of 45-50%, no RWMA, mildly dilated left atrium measuring 42 mm, RVSF and PASP were normal.     Past Medical History:  Diagnosis Date  .  Fatty liver 06/2014   by abd [place]  . Mitral regurgitation    a. TTE 2-16: EF 60-65%, mild MR/TR, nl RV sys fxn, nl PASP  . Obesity   . OSA (obstructive sleep apnea)   . Persistent atrial fibrillation    a. dx 2013 s/p DCCV; b. recurrent Afib 2016 s/p DCCV 11/25/2014; c. CHADS2VASc 0  . Prediabetes   . 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 CARDIOVASCULAR STRESS TEST  ~2005   thinks at [facility], told normal  . carotid US  08/2014   no plaque  . COLONOSCOPY  2000   WNL, for weight loss after started gym  . MRI  08/2014   brain - WNL, skull base C1/2 articulation  . TONSILLECTOMY AND ADENOIDECTOMY  childhood  . US ECHOCARDIOGRAPHY  08/2014   EF 60%, nl vent fxn, mildly dilated LA, mild MR, mild TR     No outpatient medications have been marked as taking for the 12/12/18 encounter (Appointment) with Stanley Iba, MD.     Allergies:   Lexapro [escitalopram oxalate] and Vicodin [hydrocodone-acetaminophen]   Social History   Tobacco Use  . Smoking status: Former Smoker    Last attempt to quit: 08/29/1996    Years since quitting: 22.3  . Smokeless tobacco: Never Used  Substance Use Topics  . Alcohol use: Yes    Alcohol/week: 2.0 standard drinks    Types: 2 Cans of beer per week    Comment: Occasional  . Drug use: Yes    Frequency: 5.0 times per week    Types: Cocaine    Comment: 3-5 grams a day      Current Outpatient Medications on File Prior to Visit  Medication Sig Dispense Refill  . digoxin (LANOXIN) 0.125 MG tablet Take 1 tablet (0.125 mg total) by mouth daily. 30 tablet 0  . furosemide (LASIX) 20 MG tablet Take 1 tablet (20 mg total) by mouth as needed for edema. 30 tablet 0  . metoprolol succinate (TOPROL-XL) 25 MG 24 hr tablet Take 1 tablet (25 mg total) by mouth daily. 90 tablet 1   No current facility-administered medications on file prior to visit.      Family Hx: The patient's family history includes CAD (age of onset: 13) in his sister; Cancer (age of onset: 8) in his father; Coronary artery disease (age of onset: 53) in his father; Diabetes in his mother; Heart attack in his father; Heart disease in his mother; Hypertension in his father. There is no history of Stroke.  ROS:   Please see the history of present illness.    Review of Systems  Constitutional: Negative.    Respiratory: Negative.   Cardiovascular: Negative.   Gastrointestinal: Negative.   Musculoskeletal: Negative.   Neurological: Negative.   Psychiatric/Behavioral: Negative.   All other systems reviewed and are negative.     Labs/Other Tests and Data Reviewed:    Recent Labs: 10/08/2018: BUN 13; Creatinine, Ser 1.12; Hemoglobin 15.6; Platelets 354; Potassium 4.8; Sodium 139   Recent Lipid Panel Lab Results  Component Value Date/Time   CHOL 177 09/08/2015 10:36 AM   CHOL 157 09/25/2014 05:31 PM   CHOL 175 10/10/2011   TRIG 124.0 09/08/2015 10:36 AM   TRIG 250 (H) 09/25/2014 05:31 PM   TRIG 93 10/10/2011   HDL 49.80 09/08/2015 10:36 AM   HDL 33 (L) 09/25/2014 05:31 PM  CHOLHDL 4 09/08/2015 10:36 AM   LDLCALC 103 (H) 09/08/2015 10:36 AM   LDLCALC 74 09/25/2014 05:31 PM   LDLDIRECT 105 10/10/2011    Wt Readings from Last 3 Encounters:  12/12/18 203 lb (92.1 kg)  10/08/18 197 lb (89.4 kg)  12/01/17 222 lb 12 oz (101 kg)     Exam:    Vital Signs: Vital signs may also be detailed in the HPI There were no vitals taken for this visit.  Wt Readings from Last 3 Encounters:  12/12/18 203 lb (92.1 kg)  10/08/18 197 lb (89.4 kg)  12/01/17 222 lb 12 oz (101 kg)   Temp Readings from Last 3 Encounters:  12/12/18 (!) 95.6 F (35.3 C) (Axillary)  12/01/17 (!) 97.2 F (36.2 C)  11/29/17 98.1 F (36.7 C) (Oral)   BP Readings from Last 3 Encounters:  12/12/18 102/74  10/08/18 (!) 84/60  12/01/17 95/65   Pulse Readings from Last 3 Encounters:  12/12/18 73  10/08/18 67  12/01/17 87    Vitals: 102/74 Pulse 73, weight 203  Well nourished, well developed male in no acute distress. Constitutional:  oriented to person, place, and time. No distress.    ASSESSMENT & PLAN:    Permanent atrial fibrillation Rate well controlled Tolerating anticoagulation No further work-up needed at this time Long discussion concerning how to get his medications as he is in between jobs  and insurance We will put him in touch with our nurse to discuss company assistance for Xarelto, medical management services  Dilated cardiomyopathy (HCC) Previously with mildly depressed ejection fraction 45% in the setting of rapid atrial fibrillation Last echocardiogram March 2019 As he is asymptomatic, no further work-up at this time    COVID-19 Education: The signs and symptoms of COVID-19 were discussed with the patient and how to seek care for testing (follow up with PCP or arrange E-visit).  The importance of social distancing was discussed today.  Patient Risk:   After full review of this patients clinical status, I feel that they are at least moderate risk at this time.  Time:   Today, I have spent 25 minutes with the patient with telehealth technology discussing the cardiac and medical problems/diagnoses detailed above     Medication Adjustments/Labs and Tests Ordered: Current medicines are reviewed at length with the patient today.  Concerns regarding medicines are outlined above.   Tests Ordered: No tests ordered   Medication Changes: No changes made   Disposition: Follow-up in 12 months   Signed, Julien Nordmannimothy Mazi Schuff, MD  12/12/2018 3:41 PM    [facility] [place] [facility] [address], [place], [place] [address]

## 2018-12-12 ENCOUNTER — Ambulatory Visit (INDEPENDENT_AMBULATORY_CARE_PROVIDER_SITE_OTHER): Payer: Self-pay | Admitting: Family Medicine

## 2018-12-12 ENCOUNTER — Other Ambulatory Visit: Payer: Self-pay

## 2018-12-12 ENCOUNTER — Encounter: Payer: Self-pay | Admitting: *Deleted

## 2018-12-12 ENCOUNTER — Other Ambulatory Visit: Payer: Self-pay | Admitting: Physician Assistant

## 2018-12-12 ENCOUNTER — Telehealth (INDEPENDENT_AMBULATORY_CARE_PROVIDER_SITE_OTHER): Payer: Self-pay | Admitting: Cardiovascular Disease

## 2018-12-12 ENCOUNTER — Encounter: Payer: Self-pay | Admitting: Family Medicine

## 2018-12-12 VITALS — BP 102/74 | HR 73 | Temp 95.6°F | Ht 70.0 in | Wt 203.0 lb

## 2018-12-12 DIAGNOSIS — Z1211 Encounter for screening for malignant neoplasm of colon: Secondary | ICD-10-CM

## 2018-12-12 DIAGNOSIS — G4733 Obstructive sleep apnea (adult) (pediatric): Secondary | ICD-10-CM

## 2018-12-12 DIAGNOSIS — I420 Dilated cardiomyopathy: Secondary | ICD-10-CM

## 2018-12-12 DIAGNOSIS — F411 Generalized anxiety disorder: Secondary | ICD-10-CM

## 2018-12-12 DIAGNOSIS — I4821 Permanent atrial fibrillation: Secondary | ICD-10-CM

## 2018-12-12 MED ORDER — PAROXETINE HCL 40 MG PO TABS: 40.0000 mg | ORAL_TABLET | Freq: Every morning | ORAL | 4 refills | Status: DC

## 2018-12-12 MED ORDER — RIVAROXABAN 20 MG PO TABS: 20.0000 mg | ORAL_TABLET | Freq: Every day | ORAL | 3 refills | Status: DC

## 2018-12-12 NOTE — Assessment & Plan Note (Signed)
Stable period on paxil 40mg  daily - will refill for a year. Pt agrees with plan.

## 2018-12-12 NOTE — Progress Notes (Signed)
Virtual visit attempted through [employer]. Due to national recommendations of social distancing due to [place] 19, a virtual visit is felt to be most appropriate for this patient at this time. Interactive audio and video telecommunications were attempted between myself and Stanley Kane, however failed due to patient having technical difficulties (never received text). We continued and completed visit with audio only. Informed consent to continue with phone call received.  Time: 11:33am - 11:44am   Patient location: home Provider location: [place] at [facility], office If any vitals were documented, they were collected by patient at home unless specified below.    BP 102/74 (BP Location: Right Arm, Patient Position: Sitting, Cuff Size: Normal)   Pulse 73   Temp (!) 95.6 F (35.3 C) (Axillary) Comment (Src): right  Ht 5' 10"  (1.778 m)   Wt 203 lb (92.1 kg)   BMI 29.13 kg/m    CC: med refill visit Subjective:    Patient ID: Stanley Kane, male    DOB: [birth year], 53 y.o.   MRN: [id number]  HPI: Stanley Kane is a 54 y.o. male presenting on 12/12/2018 for Medication Refill   Last seen here 07/2017. Self pay patient. Regularly followed by cardiology for permanent afib on xarelto, last OV reviewed 09/2018. He did have ER visit for palpitations after cocaine use 08/2017.   Requests paxil refilled - takes 40m daily for GAD started 06/2015, tolerates well. Feels he's doing well at current dose. No significant depression.   Agrees to iFOB for colon cancer screening. We will mail him a kit.   Currently unemployed. Looking into different jobs.      Relevant past medical, surgical, family and social history reviewed and updated as indicated. Interim medical history since our last visit reviewed. Allergies and medications reviewed and updated. Outpatient Medications Prior to Visit  Medication Sig Dispense Refill  . digoxin (LANOXIN) 0.125 MG tablet Take 1 tablet (0.125 mg total) by mouth  daily. 30 tablet 0  . furosemide (LASIX) 20 MG tablet Take 1 tablet (20 mg total) by mouth as needed for edema. 30 tablet 0  . metoprolol succinate (TOPROL-XL) 25 MG 24 hr tablet Take 1 tablet (25 mg total) by mouth daily. 90 tablet 1  . rivaroxaban (XARELTO) 20 MG TABS tablet TAKE ONE TABLET BY MOUTH EVERY DAY WITH SUPPER 30 tablet 0  . PARoxetine (PAXIL) 40 MG tablet TAKE ONE TABLET BY MOUTH EVERY MORNING 90 tablet 0  . metoprolol succinate (TOPROL-XL) 25 MG 24 hr tablet Take 1 tablet (25 mg total) by mouth daily. Take with or immediately following a meal. 90 tablet 3  . metoprolol succinate (TOPROL-XL) 50 MG 24 hr tablet TAKE 1 TABLET BY MOUTH DAILY WITH OR IMMEDIATELY FOLLOWING A MEAL. 90 tablet 3   No facility-administered medications prior to visit.      Per HPI unless specifically indicated in ROS section below Review of Systems Objective:    BP 102/74 (BP Location: Right Arm, Patient Position: Sitting, Cuff Size: Normal)   Pulse 73   Temp (!) 95.6 F (35.3 C) (Axillary) Comment (Src): right  Ht 5' 10"  (1.778 m)   Wt 203 lb (92.1 kg)   BMI 29.13 kg/m   Wt Readings from Last 3 Encounters:  12/12/18 203 lb (92.1 kg)  10/08/18 197 lb (89.4 kg)  12/01/17 222 lb 12 oz (101 kg)     Physical exam: Gen: alert, NAD, not ill appearing Pulm: speaks in complete sentences without increased work of breathing  Psych: normal mood, normal thought content      Results for orders placed or performed during the hospital encounter of 11/08/18  Digoxin level  Result Value Ref Range   Digoxin Level 0.4 (L) 0.8 - 2.0 ng/mL   Assessment & Plan:  Will mail iFOB to patient.  Problem List Items Addressed This Visit    GAD (generalized anxiety disorder) - Primary    Stable period on paxil 32m daily - will refill for a year. Pt agrees with plan.       Relevant Medications   PARoxetine (PAXIL) 40 MG tablet    Other Visit Diagnoses    Special screening for malignant neoplasms, colon        Relevant Orders   Fecal occult blood, imunochemical       Meds ordered this encounter  Medications  . PARoxetine (PAXIL) 40 MG tablet    Sig: Take 1 tablet (40 mg total) by mouth every morning.    Dispense:  90 tablet    Refill:  4   Orders Placed This Encounter  Procedures  . Fecal occult blood, imunochemical    Standing Status:   Future    Standing Expiration Date:   12/12/2019    Follow up plan: No follow-ups on file.  JRia Bush MD

## 2018-12-12 NOTE — Telephone Encounter (Signed)
Please review for refill.  

## 2018-12-12 NOTE — Patient Instructions (Addendum)
DOT letter done and will be placed up front for you to pick up at our office.  Medication Instructions:  Sent in refill for 15 pills to reduce your out of pocket expense while we try to find you some assistance.  Needs medical management for meds (no job, no insurance)  Sent message over to [facility] to see if they can also assist. They do have a web site which has the applications and information needed for assistance.    Clinics: [facility] [address]  [place], [place] [address] Phone: [phone] Web page available with hours.  [facility] [address]  [place] [address] Phone: [phone] Web page with hours and additional numbers  If you need a refill on your cardiac medications before your next appointment, please call your pharmacy.    Lab work: No new labs needed   If you have labs (blood work) drawn today and your tests are completely normal, you will receive your results only by: Marland Kitchen MyChart Message (if you have MyChart) OR . A paper copy in the mail If you have any lab test that is abnormal or we need to change your treatment, we will call you to review the results.   Testing/Procedures: No new testing needed   Follow-Up: At [facility], you and your health needs are our priority.  As part of our continuing mission to provide you with exceptional heart care, we have created designated Provider Care Teams.  These Care Teams include your primary Cardiologist (physician) and Advanced Practice Providers (APPs -  Physician Assistants and Nurse Practitioners) who all work together to provide you with the care you need, when you need it.  . You will need a follow up appointment in 12 months .   Please call our office 2 months in advance to schedule this appointment.    . Providers on your designated Care Team:   . Nicolasa Ducking, NP . [name],  PA-C . Marisue Ivan, PA-C  Any Other Special Instructions Will Be Listed Below (If Applicable).  For educational health videos Log in to : [email or web address] Or : [email or web address], password : [email or web address]

## 2018-12-12 NOTE — Telephone Encounter (Signed)
Pt's age 53, wt 92.1 kg, serum creatinine on 10/08/18: 1.12, CrCl 100.51, last ov w/ [name], PA on 10/08/18. Refill sent in to pharmacy.

## 2018-12-14 ENCOUNTER — Telehealth: Payer: Self-pay | Admitting: Pharmacy Technician

## 2018-12-14 NOTE — Telephone Encounter (Signed)
Provided patient with new patient packet to obtain [facility] services.  Stanley Kane J. Ailie Gage Care Manager [facility]  

## 2018-12-21 ENCOUNTER — Other Ambulatory Visit (INDEPENDENT_AMBULATORY_CARE_PROVIDER_SITE_OTHER): Payer: Self-pay

## 2018-12-21 ENCOUNTER — Other Ambulatory Visit: Payer: Self-pay

## 2018-12-21 DIAGNOSIS — Z1211 Encounter for screening for malignant neoplasm of colon: Secondary | ICD-10-CM

## 2018-12-21 LAB — FECAL OCCULT BLOOD, IMMUNOCHEMICAL: Fecal Occult Bld: NEGATIVE

## 2018-12-25 ENCOUNTER — Encounter: Payer: Self-pay | Admitting: Family Medicine

## 2018-12-25 LAB — FECAL OCCULT BLOOD, IMMUNOCHEMICAL: IFOBT: NEGATIVE

## 2018-12-26 ENCOUNTER — Other Ambulatory Visit: Payer: Self-pay | Admitting: Physician Assistant

## 2018-12-26 ENCOUNTER — Ambulatory Visit: Payer: Self-pay | Admitting: Cardiovascular Disease

## 2019-01-14 ENCOUNTER — Telehealth: Payer: Self-pay | Admitting: *Deleted

## 2019-01-14 NOTE — Telephone Encounter (Signed)
Left voicemail message for patient to call back regarding medication assistance resources.

## 2019-01-14 NOTE — Telephone Encounter (Signed)
-----   Message from Daneil Dolin sent at 01/11/2019  2:43 PM EDT ----- Regarding: Reply to your message Rinaldo Cloud,  Thank you for checking with [place] on this patient, I have reviewed notes and discussed with Kathie Rhodes:  I received your message on this patient for help with Xarelto. Looks like Willeen Niece our Care Manager sent a New Patient packet to patient 12/14/18 to be completed, we have no record of receiving any information back at our office. If patient needs help completing the information he can call [phone] and Kathie Rhodes can talk with him.  Just wanted to let you know that he has not returned information or called [place].  Thanks, Rhetta Mura Prescription Assistance Coordinator [facility]

## 2019-01-16 NOTE — Telephone Encounter (Signed)
Patient calling to check dig dosage

## 2019-01-16 NOTE — Telephone Encounter (Signed)
Patient returned my call regarding assistance. He states that he is almost out of his digoxin and needs refill. Reviewed chart and it appears that Dig level was elevated and then changes were made. Advised that I would forward to provider for review and approval for refill. Reviewed medication assistance program and provided him with number to call and request assistance with medications. He was appreciative for the call with no further questions at this time.    Results for Stanley Kane, Stanley Kane (MRN [id number]) as of 01/16/2019 10:05  Ref. Range 10/08/2018 10:32 11/08/2018 13:22  Digoxin, Serum Latest Ref Range: 0.8 - 2.0 ng/mL  0.4 (L)  Digoxin, Serum Latest Ref Range: 0.5 - 0.9 ng/mL 2.1 (HH)

## 2019-01-16 NOTE — Telephone Encounter (Signed)
Left voicemail message to call back regarding medication assistance programs.

## 2019-01-17 ENCOUNTER — Other Ambulatory Visit: Payer: Self-pay

## 2019-01-17 ENCOUNTER — Ambulatory Visit: Payer: Self-pay | Admitting: Pharmacy Technician

## 2019-01-17 DIAGNOSIS — Z79899 Other long term (current) drug therapy: Secondary | ICD-10-CM

## 2019-01-17 MED ORDER — RIVAROXABAN 20 MG PO TABS: 20.0000 mg | ORAL_TABLET | Freq: Every day | ORAL | 3 refills | Status: DC

## 2019-01-17 MED ORDER — DIGOXIN 125 MCG PO TABS: 125.0000 ug | ORAL_TABLET | Freq: Every day | ORAL | 11 refills | Status: DC

## 2019-01-17 NOTE — Telephone Encounter (Signed)
Left detailed voicemail message that pending provider review for refill.

## 2019-01-17 NOTE — Telephone Encounter (Signed)
Refill current dose of digoxin 0.125 mg daily. Follow up as planned with Dr. Mariah Milling.

## 2019-01-17 NOTE — Addendum Note (Signed)
Addended by: Bryna Colander on: 01/17/2019 11:26 AM   Modules accepted: Orders

## 2019-01-17 NOTE — Telephone Encounter (Signed)
Spoke with patient and he did get approval through [facility] and they are going to assist with his medications. Sent in refill for Xarelto and he has enough refills on the digoxin. Advised him to please give [place] a call if he should have any further questions. He was appreciative for the call with no further concerns at this time.

## 2019-01-24 NOTE — Progress Notes (Signed)
Assisted  Patient with the completion of the financial assistance application for [place] due to recent hospital visit.  Patient agreed to be responsible for gathering financial information and forwarding to appropriate department in [facility].    Assisted patient with the completion of [facility]'s application and contract.  Patient agreed to all terms of the [facility] contract.    Patient approved to receive medication assistance at [facility] as long as eligibility criteria continues to be met.    Provided patient with Civil engineer, contracting based on his particular needs.    [place] [facility]

## 2019-02-07 ENCOUNTER — Telehealth: Payer: Self-pay | Admitting: Cardiovascular Disease

## 2019-02-07 NOTE — Telephone Encounter (Signed)
Patient dropped off forms from [place] to be completed Placed interoffice mail to [facility]

## 2019-04-18 ENCOUNTER — Encounter: Payer: Self-pay | Admitting: Pharmacist

## 2019-04-18 ENCOUNTER — Ambulatory Visit: Payer: Self-pay | Admitting: Pharmacist

## 2019-04-18 NOTE — Progress Notes (Cosign Needed)
Medication Management Clinic Visit Note  Patient: Stanley Kane MRN: [id number] Date of Birth: 07/17/1966 PCP: Ria Bush, MD   Stanley Kane 53 y.o. male was contacted via telephone for initial annual MTM visit. Two identifiers (DOB and address) were used to verified patient.   There were no vitals taken for this visit.  Patient Information   Past Medical History:  Diagnosis Date  . Fatty liver 06/2014   Fatty Liver was a disease that father had  . Mitral regurgitation    a. TTE 2-16: EF 60-65%, mild MR/TR, nl RV sys fxn, nl PASP  . Obesity   . OSA (obstructive sleep apnea)   . Persistent atrial fibrillation    a. dx 2013 s/p DCCV; b. recurrent Afib 2016 s/p DCCV 11/25/2014; c. CHADS2VASc 0  . Prediabetes   . 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 CARDIOVASCULAR STRESS TEST  ~2005   thinks at [facility], told normal  . carotid US  08/2014   no plaque  . COLONOSCOPY  2000   WNL, for weight loss after started gym  . MRI  08/2014   brain - WNL, skull base C1/2 articulation  . TONSILLECTOMY AND ADENOIDECTOMY  childhood  . US ECHOCARDIOGRAPHY  08/2014   EF 60%, nl vent fxn, mildly dilated LA, mild MR, mild TR     Family History  Problem Relation Age of Onset  . Hypertension Father   . Coronary artery disease Father 74  . Heart attack Father   . Cirrhosis Father   . Hypercalcemia Father   . Diabetes Mother   . Heart disease Mother   . CAD Sister 23       deceased from MI  . GI Disease Sister   . 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 Alcohol/week: 2.0 standard drinks  . Types: 2 Cans of beer per week   Comment: quit beginning of 2020      Social History   Tobacco Use  Smoking Status Former Smoker  . Quit date: 08/29/1996  . Years since quitting: 22.6  Smokeless Tobacco Never Used      Health Maintenance  Topic Date Due  . HIV Screening  10/12/1981  . COLONOSCOPY  10/12/2016  . INFLUENZA VACCINE  03/30/2019  . COLON CANCER SCREENING ANNUAL FOBT  12/25/2019  . DTaP/Tdap/Td (2 - Td) 04/09/2022  . TETANUS/TDAP  04/09/2022   Outpatient Encounter Medications as of 04/18/2019  Medication Sig  . digoxin (LANOXIN) 0.125 MG tablet Take 1 tablet (125 mcg total) by mouth daily.  . furosemide (LASIX) 20 MG tablet Take 1 tablet (20 mg total) by mouth as needed for edema.  . metoprolol succinate (TOPROL-XL) 25 MG 24 hr tablet Take 1 tablet (25 mg total) by mouth daily.  Marland Kitchen PARoxetine (PAXIL) 40 MG tablet Take 1 tablet (40 mg total) by mouth every morning.  . rivaroxaban (XARELTO) 20 MG TABS tablet Take 1  tablet (20 mg total) by mouth daily with supper. (Patient taking differently: Take 20 mg by mouth daily after breakfast. )   No facility-administered encounter medications on file as of 04/18/2019.      Assessment and Plan: Access/Adherence: [facility]. Patient mentions occasionally missing 2-3 dose around the time when refills are due.   A-Fib: Patient is on digoxin, furosemide, xarelto, and metoprolol. Patient mentions that he half the dose of Metoprolol 50 mg ER tablet, hence why he have extra. After verifying, patient states he may have understood the MD's instruction wrong on which dose he shoul;d reduce due to a lab work that show toxic levels. Will clarify with MD on correct strength and dose patient should be on. QS1 have patient taking 50 mg but Epic have 25 mg. Patient is adherence with no  complications.   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 Mention that his dose may need to be increase. Patient is adherence.   Stanley Kane, PharmD Candidate [phone] [facility] [employer]

## 2019-04-23 ENCOUNTER — Other Ambulatory Visit: Payer: Self-pay

## 2019-08-13 ENCOUNTER — Telehealth: Payer: Self-pay | Admitting: Pharmacy Technician

## 2019-08-13 NOTE — Telephone Encounter (Signed)
Spoke with patient.  Patient stated that he has prescription drug coverage.  Asks that medications be transferred to [place].  Communicated patient's wishes to pharmacy technicians, Zoila Shutter and Velda Shell.  [facility]

## 2019-08-20 ENCOUNTER — Telehealth: Payer: Self-pay

## 2019-08-20 NOTE — Telephone Encounter (Signed)
Plz call - this is something I would like to start with eval here in office - if he hasn't seek care yet, plz schedule in office evaluation.

## 2019-08-20 NOTE — Telephone Encounter (Signed)
Lvm for pt to call back.  Need to schedule an in office visit for left leg pain/numbness.  

## 2019-08-20 NOTE — Telephone Encounter (Signed)
I spoke with pt;pt has numbness in lt leg for 1 year. For 2 wks pt has lower center of back pain and is worsening and pain radiates down lt leg. Pain level now is 10 when moves to get up. 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 FYI to Dr Darnell Level.

## 2019-08-20 NOTE — Telephone Encounter (Signed)
[facility] Night - Client TELEPHONE ADVICE RECORD AccessNurse Patient Name: Stanley Kane Gender: Male DOB: 07-May-1966 Age: 53 Y 10 M 8 D Return Phone Number: [phone] (Primary) Address: City/State/Zip: [place] [address] Client [facility] Night - Client Client Site [place] Physician Ria Bush - MD Contact Type Call Who Is Calling Patient / Member / Family / Caregiver Call Type Triage / Clinical Relationship To Patient Self Return Phone Number [phone] (Primary) Chief Complaint Leg Pain Reason for Call Request to Schedule Office Appointment Initial Comment Caller states that he thinks he has a pinched nerve and feels like a knife is in his thigh in his left leg. Translation No Nurse Assessment Nurse: Chesley Noon, RN, Lattie Haw Date/Time Eilene Ghazi Time): 08/20/2019 7:45:15 AM Confirm and document reason for call. If symptomatic, describe symptoms. ---Caller states he thinks he may have a pinched nerve. He has had numbness in his left leg for over a year but last night it started having a lot of sharp, stabbing pain. Lower back pain as well. Pain in front, left thigh. Has the patient had close contact with a person known or suspected to have the novel coronavirus illness OR traveled / lives in area with major community spread (including international travel) in the last 14 days from the onset of symptoms? * If Asymptomatic, screen for exposure and travel within the last 14 days. ---No Does the patient have any new or worsening symptoms? ---Yes Will a triage be completed? ---Yes Related visit to physician within the last 2 weeks? ---No Does the PT have any chronic conditions? (i.e. diabetes, asthma, this includes High risk factors for pregnancy, etc.) ---No Is this a behavioral health or substance abuse call? ---No Guidelines Guideline Title Affirmed Question Affirmed Notes  Nurse Date/Time Eilene Ghazi Time) Leg Pain [1] SEVERE pain (e.g., excruciating, unable to do any normal activities) AND [2] not improved after 2 hours of pain medicine Jamas Lav 08/20/2019 7:48:43 AM Disp. Time Eilene Ghazi Time) Disposition Final User PLEASE NOTE: All timestamps contained within this report are represented as [place] Standard Time. CONFIDENTIALTY NOTICE: This fax transmission is intended only for the addressee. It contains information that is legally privileged, confidential or otherwise protected from use or disclosure. If you are not the intended recipient, you are strictly prohibited from reviewing, disclosing, copying using or disseminating any of this information or taking any action in reliance on or regarding this information. If you have received this fax in error, please notify us immediately by telephone so that we can arrange for its return to [place]. Phone: [phone], Toll-Free: [phone], Fax: [phone] Page: 2 of 2 Call Id: [id number] 08/20/2019 7:53:22 AM See HCP within 4 Hours (or PCP triage) Yes Chesley Noon, RN, Leland Johns Disagree/Comply Comply Caller Understands Yes PreDisposition Call Doctor Care Advice Given Per Guideline SEE HCP WITHIN 4 HOURS (OR PCP TRIAGE): * IF OFFICE WILL BE OPEN: You need to be seen within the next 3 or 4 hours. Call your doctor (or NP/PA) now or as soon as the office opens. PAIN MEDICINES: * For pain relief, you can take either acetaminophen, ibuprofen, or naproxen. CALL BACK IF: * You become worse. CARE ADVICE given per Leg Pain (Adult) guideline. Comments User: Dinah Beers, RN Date/Time Eilene Ghazi Time): 08/20/2019 7:51:16 AM Leg pain 10/10 when he gets up Referrals REFERRED TO PCP OFFICE

## 2019-08-21 NOTE — Telephone Encounter (Signed)
Lvm for pt to call back.  Need to schedule an in office visit for left leg pain/numbness.  

## 2019-08-22 NOTE — Telephone Encounter (Signed)
Lvm for pt to call back.  Need to schedule an in office visit for left leg pain/numbness.  

## 2019-08-28 NOTE — Telephone Encounter (Signed)
Lvm for pt to call back.  Need to schedule an in office visit for left leg pain/numbness.

## 2019-09-03 ENCOUNTER — Encounter: Payer: Self-pay | Admitting: *Deleted

## 2019-09-03 NOTE — Telephone Encounter (Signed)
MyChart message sent for patient to schedule appt.  Several unsuccessful attempts via telephone.

## 2019-10-22 ENCOUNTER — Other Ambulatory Visit: Payer: Self-pay | Admitting: Cardiovascular Disease

## 2019-10-22 NOTE — Telephone Encounter (Signed)
Refill Request.  

## 2019-11-16 ENCOUNTER — Other Ambulatory Visit: Payer: Self-pay | Admitting: Cardiovascular Disease

## 2019-11-18 NOTE — Telephone Encounter (Signed)
Pt's age 54, wt 92.1 kg, SCr 1.12, CrCl 99.36, last ov w/ TG 12/12/18, upcoming appt w/ TG on 12/10/19 - added note that pt needs BMET for further refills.

## 2019-11-18 NOTE — Telephone Encounter (Signed)
Refill request

## 2019-12-09 NOTE — Progress Notes (Deleted)
NO SHOW

## 2019-12-10 ENCOUNTER — Ambulatory Visit: Payer: 59 | Admitting: Cardiovascular Disease

## 2019-12-11 ENCOUNTER — Encounter: Payer: Self-pay | Admitting: Cardiovascular Disease

## 2019-12-13 ENCOUNTER — Ambulatory Visit (INDEPENDENT_AMBULATORY_CARE_PROVIDER_SITE_OTHER): Payer: 59 | Admitting: Physician Assistant

## 2019-12-13 ENCOUNTER — Encounter: Payer: Self-pay | Admitting: Physician Assistant

## 2019-12-13 ENCOUNTER — Other Ambulatory Visit: Payer: Self-pay

## 2019-12-13 VITALS — BP 100/72 | HR 122 | Ht 69.0 in | Wt 229.0 lb

## 2019-12-13 DIAGNOSIS — Z79899 Other long term (current) drug therapy: Secondary | ICD-10-CM

## 2019-12-13 DIAGNOSIS — F1411 Cocaine abuse, in remission: Secondary | ICD-10-CM | POA: Diagnosis not present

## 2019-12-13 DIAGNOSIS — Z024 Encounter for examination for driving license: Secondary | ICD-10-CM

## 2019-12-13 DIAGNOSIS — I4821 Permanent atrial fibrillation: Secondary | ICD-10-CM | POA: Diagnosis not present

## 2019-12-13 DIAGNOSIS — Z6833 Body mass index (BMI) 33.0-33.9, adult: Secondary | ICD-10-CM

## 2019-12-13 MED ORDER — DIGOXIN 125 MCG PO TABS: 125.0000 ug | ORAL_TABLET | Freq: Every day | ORAL | 3 refills | Status: DC

## 2019-12-13 MED ORDER — FUROSEMIDE 20 MG PO TABS: 20.0000 mg | ORAL_TABLET | ORAL | 3 refills | Status: DC | PRN

## 2019-12-13 MED ORDER — METOPROLOL SUCCINATE ER 25 MG PO TB24: 25.0000 mg | ORAL_TABLET | Freq: Every day | ORAL | 3 refills | Status: DC

## 2019-12-13 NOTE — Progress Notes (Signed)
Office Visit    Patient Name: Stanley Kane Date of Encounter: 12/13/2019  Primary Care Provider:  [name], MD Primary Cardiologist:  Julien Nordmann, MD  Chief Complaint    Chief Complaint  Patient presents with  . Other    12 month follow up and patient needs DOT clearance. meds reviewed verbally with patient.     54 yo male with history of Afib s/p DCCV back in Afib, prior medication noncompliance, previous history of drug use, and here for DOT clearance.   Past Medical History    Past Medical History:  Diagnosis Date  . Fatty liver 06/2014   Fatty Liver was a disease that father had  . Mitral regurgitation    a. TTE 2-16: EF 60-65%, mild MR/TR, nl RV sys fxn, nl PASP  . Obesity   . OSA (obstructive sleep apnea)   . Persistent atrial fibrillation (HCC)    a. dx 2013 s/p DCCV; b. recurrent Afib 2016 s/p DCCV 11/25/2014; c. CHADS2VASc 0  . Prediabetes   . 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 CARDIOVASCULAR STRESS TEST  ~2005   thinks at [facility], told normal  . carotid US  08/2014   no plaque  . COLONOSCOPY  2000   WNL, for weight loss after started gym  . MRI  08/2014   brain - WNL, skull base C1/2 articulation  . TONSILLECTOMY AND ADENOIDECTOMY  childhood  . US ECHOCARDIOGRAPHY  08/2014   EF 60%, nl vent fxn, mildly dilated LA, mild MR, mild TR    Allergies  Allergies  Allergen Reactions  . Vicodin [Hydrocodone-Acetaminophen]     Makes him "jittery"    History of Present Illness    Stanley Kane is a 54 y.o. male with PMH as above. He underwent DCCV 10/2014 and was again noted to be in Afib 12/2014 and has remained in Afib at subsequent visits since that time and leading ot the discontinuation of flecainide and rate control strategy 09/2016. He underwent calcium scoring 09/2016 given atypical CP with score 0. In 2019, he underwent NM study  without ischemia or scar but EF reduced and thought 2/2 Afib. He was seen 08/2017 in the setting of reported cocaine use. 10/2017 echo showed EF 45-50%, mild LAE.    He reports today for annual follow-up and DOT clearance, stating he needs a Lexiscan.  He denies any recent concerning cardiac symptoms and reports that he has been doing well from a cardiac standpoint. He denies chest pain, palpitations, dyspnea, pnd, orthopnea, n, v, dizziness, syncope, edema, weight gain, or early satiety. He notes he did not take his BB this AM until right before his clinic which is the reason for his elevated HR. He states that he does not hydrate very well. He denies any significant intake of salt. He reports that he is still no longer using drugs. He states that he ran out of his Xarelto and has not been taking it the last few days. No reported s/sx of stroke on review of sx today. He states that he cannot afford Xarelto anymore with review of other options available for anticoagulation today. He otherwise reports medication compliance. No s/sx consistent with bleeding.   Home Medications    Prior to Admission medications   Medication Sig Start Date End Date Taking? Authorizing Provider  digoxin (LANOXIN) 0.125 MG tablet Take 1 tablet (125 mcg  total) by mouth daily. 01/17/19  Yes Gollan, Tollie Pizza, MD  furosemide (LASIX) 20 MG tablet Take 1 tablet (20 mg total) by mouth as needed for edema. 11/08/18  Yes Dunn, Raymon Mutton, PA-C  metoprolol succinate (TOPROL-XL) 25 MG 24 hr tablet Take 1 tablet (25 mg total) by mouth daily. 11/20/18  Yes Dunn, Raymon Mutton, PA-C  PARoxetine (PAXIL) 40 MG tablet Take 1 tablet (40 mg total) by mouth every morning. 12/12/18  Yes [name], MD  XARELTO 20 MG TABS tablet TAKE 1 TABLET BY MOUTH DAILY WITH SUPPER 11/18/19  Yes Antonieta Iba, MD    Review of Systems    He denies chest pain, palpitations, dyspnea, pnd, orthopnea, n, v, dizziness, syncope, edema, weight gain, or early satiety.     All other systems reviewed and are otherwise negative except as noted above.  Physical Exam    VS:  BP 100/72 (BP Location: Left Arm, Patient Position: Sitting, Cuff Size: Normal)   Pulse (!) 122   Ht 5\' 9"  (1.753 m)   Wt 229 lb (103.9 kg)   SpO2 97%   BMI 33.82 kg/m  , BMI Body mass index is 33.82 kg/m. GEN: Well nourished, well developed, in no acute distress. HEENT: normal. Neck: Supple, no JVD, carotid bruits, or masses. Cardiac: IRIR and tachycardic, no murmurs, rubs, or gallops. No clubbing, cyanosis, edema.  Radials/DP/PT 2+ and equal bilaterally.  Respiratory:  Respirations regular and unlabored, clear to auscultation bilaterally. GI: Soft, nontender, nondistended, BS + x 4. MS: no deformity or atrophy. Skin: warm and dry, no rash. Neuro:  Strength and sensation are intact. Psych: Normal affect.  Accessory Clinical Findings    ECG personally reviewed by me today - atrial fibrillation with ventricular rate 122bpm, poor conduction lead III -no acute changes.  VITALS Reviewed today   Temp Readings from Last 3 Encounters:  12/12/18 (!) 95.6 F (35.3 C) (Axillary)  12/01/17 (!) 97.2 F (36.2 C)  11/29/17 98.1 F (36.7 C) (Oral)   BP Readings from Last 3 Encounters:  12/13/19 100/72  12/12/18 102/74  10/08/18 (!) 84/60   Pulse Readings from Last 3 Encounters:  12/13/19 (!) 122  12/12/18 73  10/08/18 67    Wt Readings from Last 3 Encounters:  12/13/19 229 lb (103.9 kg)  12/12/18 203 lb (92.1 kg)  10/08/18 197 lb (89.4 kg)     LABS  reviewed today   CareEverwhere Labs present and most recent? Yes/No: No  Lab Results  Component Value Date   WBC 7.6 10/08/2018   HGB 15.6 10/08/2018   HCT 44.4 10/08/2018   MCV 92 10/08/2018   PLT 354 10/08/2018   Lab Results  Component Value Date   CREATININE 1.12 10/08/2018   BUN 13 10/08/2018   NA 139 10/08/2018   K 4.8 10/08/2018   CL 99 10/08/2018   CO2 23 10/08/2018   Lab Results  Component Value Date    ALT 34 09/04/2017   AST 34 09/04/2017   ALKPHOS 46 09/04/2017   BILITOT 0.5 09/04/2017   Lab Results  Component Value Date   CHOL 177 09/08/2015   HDL 49.80 09/08/2015   LDLCALC 103 (H) 09/08/2015   LDLDIRECT 105 10/10/2011   TRIG 124.0 09/08/2015   CHOLHDL 4 09/08/2015    Lab Results  Component Value Date   HGBA1C 5.8 09/08/2015   Lab Results  Component Value Date   TSH 2.736 09/04/2017     STUDIES/PROCEDURES reviewed today   Echo 11/24/2017 -  Left ventricle: The cavity size was normal. Systolic function was  mildly reduced. The estimated ejection fraction was in the range  of 45% to 50%. Wall motion was normal; there were no regional  wall motion abnormalities. The study is not technically  sufficient to allow evaluation of LV diastolic function.  - Left atrium: The atrium was mildly dilated.  - Right ventricle: Systolic function was normal.  - Pulmonary arteries: Systolic pressure was within the normal  range.   NM Stress 08/2017  Abnormal, intermediate risk myocardial perfusion stress test due to reduced LVEF.  Normal myocardial perfusion without evidence of ischemia or scar.  Left ventricular ejection fraction is moderately to severely reduced (33% by QGS, 44% by Siemens calculation), though it appears better by visual estimation. LVEF may be underestimated due to atrial fibrillation.  Assessment & Plan    Permanent Afib --Rate not well controlled. Discussed increasing current BB; however, his BP is soft. He also reports he did not take his BB this AM and until right before walking into his appoiontment. He does not confirm compliance with Xarelto. He cannot afford Xarelto and has been out of it for several days. Recommended that he complete a medication assistance form today. Also reviewed the other options for anticoagulation, including Eliquis and Xarelto. No reported neurologic sx since stopping his Xarelto. Long discussion regarding the risks of  stopping his anticoagulation. He denies any sx with his atrial fibrillation. He will complete his assistance form and resume his BB. He will call the office if, once his BB has time to work, his rate is still not well controlled later today. He will need updated labs, including digoxin, BMET, and CBC.   Dilated CM --Last echo as above with EF reduced. He is tachycardic on exam today with soft BP. He reports that he just took his BB before walking into today's appointment, which he feels is the reason for his uncontrolled ventricular rates. He denies any significant swelling or heart failure sx on current dose of lasix. Repeat BMET as above. Recommend repeat stress test as below and due to need for DOT license.  Previously noted 10/08/2018 that he should not need stress testing every 2 years as part of his DOT license, given he has never had an MI or PCI.  However, patient reports today that he does need stress testing in order to refreshes DOT license.  Lexiscan scheduled as below.  DOT License --Will arrange for stress test and updated labs.  Previously noted that he will not need a stress test every 2 years as part of his DOT license, given he has never had an MI or PCI.  Today, he reports that he does need stress testing.  He reports that he has until sometime next week for completion.  General stress testing as below.  Medication compliance --Reviewed importance of ongoing anticoagulation.  He reports today that he has not taken his Xarelto in some time, given he ran out of this medication.  He also reports that he is unable to afford this medication with completion of assistance forms today.  We reviewed alternative options for anticoagulation as well.  History of cocaine use --Denies any further use.  Hypotension --Asx. Consider dehydration as contributing, as well as rapid ventricular rate.  He did not take his beta-blocker until right before his appointment.  He will continue to check his blood  pressure at home.  He will also call the office if his ventricular rate remains elevated 2 hours  after taking his beta-blocker today.  He will call us from home today if that is the case.  Possible OSA --As previously noted and not on CPAP.  Defer to PCP.  Medication changes: None.  Restart Xarelto. Labs ordered: digoxin level, BMET, CBC Studies / Imaging ordered: Lexiscan stress test Future considerations: Increased lasix Disposition: RTC 1 year  [name], PA-C 12/13/2019

## 2019-12-13 NOTE — Patient Instructions (Addendum)
Medication Instructions:  No changes  *If you need a refill on your cardiac medications before your next appointment, please call your pharmacy*   Lab Work: Labs today CBC, BMP, Digoxin level.  If you have labs (blood work) drawn today and your tests are completely normal, you will receive your results only by: Marland Kitchen MyChart Message (if you have MyChart) OR . A paper copy in the mail If you have any lab test that is abnormal or we need to change your treatment, we will call you to review the results.   Testing/Procedures: [facility] MYOVIEW  Your caregiver has ordered a Stress Test with nuclear imaging. The purpose of this test is to evaluate the blood supply to your heart muscle. This procedure is referred to as a "Non-Invasive Stress Test." This is because other than having an IV started in your vein, nothing is inserted or "invades" your body. Cardiac stress tests are done to find areas of poor blood flow to the heart by determining the extent of coronary artery disease (CAD). Some patients exercise on a treadmill, which naturally increases the blood flow to your heart, while others who are  unable to walk on a treadmill due to physical limitations have a pharmacologic/chemical stress agent called Lexiscan . This medicine will mimic walking on a treadmill by temporarily increasing your coronary blood flow.   Please note: these test may take anywhere between 2-4 hours to complete  PLEASE REPORT TO [facility] MEDICAL MALL ENTRANCE  THE VOLUNTEERS AT THE FIRST DESK WILL DIRECT YOU WHERE TO GO  Date of Procedure:_____________________________________  Arrival Time for Procedure:______________________________  Instructions regarding medication:   _XX___ : Hold Furosemide the medication morning of procedure  _XX___:  Hold Metoprolol the night before procedure and morning of procedure    PLEASE NOTIFY THE OFFICE AT LEAST 24 HOURS IN ADVANCE IF YOU ARE UNABLE TO KEEP YOUR APPOINTMENT.   [phone] AND  PLEASE NOTIFY NUCLEAR MEDICINE AT [facility] AT LEAST 24 HOURS IN ADVANCE IF YOU ARE UNABLE TO KEEP YOUR APPOINTMENT. [phone]  How to prepare for your Myoview test:  1. Do not eat or drink after midnight 2. No caffeine for 24 hours prior to test 3. No smoking 24 hours prior to test. 4. Your medication may be taken with water.  If your doctor stopped a medication because of this test, do not take that medication. 5. Ladies, please do not wear dresses.  Skirts or pants are appropriate. Please wear a short sleeve shirt. 6. No perfume, cologne or lotion. 7. Wear comfortable walking shoes. No heels!        Follow-Up: At [facility], you and your health needs are our priority.  As part of our continuing mission to provide you with exceptional heart care, we have created designated Provider Care Teams.  These Care Teams include your primary Cardiologist (physician) and Advanced Practice Providers (APPs -  Physician Assistants and Nurse Practitioners) who all work together to provide you with the care you need, when you need it.  Your next appointment:   1 year(s)  The format for your next appointment:   In Person  Provider:    You may see Julien Nordmann, MD or one of the following Advanced Practice Providers on your designated Care Team:    Nicolasa Ducking, NP  [name], PA-C  Marisue Ivan, PA-C

## 2019-12-14 LAB — CBC
Hematocrit: 47.7 % (ref 37.5–51.0)
Hemoglobin: 16.8 g/dL (ref 13.0–17.7)
MCH: 32.8 pg (ref 26.6–33.0)
MCHC: 35.2 g/dL (ref 31.5–35.7)
MCV: 93 fL (ref 79–97)
Platelets: 399 10*3/uL (ref 150–450)
RBC: 5.12 x10E6/uL (ref 4.14–5.80)
RDW: 12.8 % (ref 11.6–15.4)
WBC: 9.4 10*3/uL (ref 3.4–10.8)

## 2019-12-14 LAB — BASIC METABOLIC PANEL
BUN/Creatinine Ratio: 12 (ref 9–20)
BUN: 14 mg/dL (ref 6–24)
CO2: 27 mmol/L (ref 20–29)
Calcium: 9.9 mg/dL (ref 8.7–10.2)
Chloride: 101 mmol/L (ref 96–106)
Creatinine, Ser: 1.15 mg/dL (ref 0.76–1.27)
GFR calc Af Amer: 84 mL/min/{1.73_m2} (ref >59)
GFR calc non Af Amer: 72 mL/min/{1.73_m2} (ref >59)
Glucose: 132 mg/dL — ABNORMAL HIGH (ref 65–99)
Potassium: 5 mmol/L (ref 3.5–5.2)
Sodium: 139 mmol/L (ref 134–144)

## 2019-12-14 LAB — DIGOXIN LEVEL: Digoxin, Serum: 0.8 ng/mL (ref 0.5–0.9)

## 2019-12-20 ENCOUNTER — Telehealth: Payer: Self-pay

## 2019-12-20 NOTE — Telephone Encounter (Signed)
-----   Message from [name], PA-C sent at 12/17/2019  6:36 PM EDT ----- Please let Stanley Kane know that his digoxin level was within normal range.  His blood counts are stable and within normal range on his anticoagulation. His renal function is  stable when compared with previous labs.It does appear that he is somewhat dehydrated. As we discussed, he should try to drink more water if possible. His sugars were elevated; however, given these were not fasting labs, this is to be expected.   Was his HR better controlled once he had time for his morning BB to kick in? His ventricular rate had been elevated during our visit; however, he had not yet taken his beta blocker that morning.

## 2019-12-20 NOTE — Telephone Encounter (Signed)
Call to patient to review labs.    Pt verbalized understanding and has no further questions at this time.    Advised pt to call for any further questions or concerns.  No further orders.   

## 2019-12-21 ENCOUNTER — Other Ambulatory Visit: Payer: Self-pay | Admitting: Family Medicine

## 2019-12-23 NOTE — Telephone Encounter (Signed)
E-scribed refill.  Plz schedule CPE and lab visits. 

## 2019-12-24 ENCOUNTER — Other Ambulatory Visit: Payer: Self-pay | Admitting: Cardiovascular Disease

## 2019-12-24 ENCOUNTER — Telehealth: Payer: Self-pay | Admitting: Cardiovascular Disease

## 2019-12-24 MED ORDER — RIVAROXABAN 20 MG PO TABS: ORAL_TABLET | ORAL | 0 refills | Status: DC

## 2019-12-24 NOTE — Telephone Encounter (Signed)
90 day refill request for Xarelto due to work travel.

## 2019-12-24 NOTE — Telephone Encounter (Signed)
Called patient and couldn't leave a voicemail but patient still needs to be scheduled for CPE and labs.

## 2019-12-24 NOTE — Telephone Encounter (Signed)
°*  STAT* If patient is at the pharmacy, call can be transferred to refill team.   1. Which medications need to be refilled? (please list name of each medication and dose if known)  Xarelto 20 mg po q d   2. Which pharmacy/location (including street and city if local pharmacy) is medication to be sent to? [facility]  [place]   3. Do they need a 30 day or 90 day supply? 90 Patient wants to change to 90 day resend as he travels work .  Please resend

## 2019-12-24 NOTE — Telephone Encounter (Signed)
Pt c/o medication issue:  1. Name of Medication: Xarelto  2. How are you currently taking this medication (dosage and times per day)? 20 mg  3. Are you having a reaction (difficulty breathing--STAT)?   4. What is your medication issue? Cost increased to $500.00/month and patient can not afford this. Patient states at last visit, another medication was dicussed and patient would like to talked to about his options

## 2019-12-25 NOTE — Telephone Encounter (Signed)
Left voicemail message for patient to call back regarding his medication and co-pay options.

## 2019-12-25 NOTE — Telephone Encounter (Signed)
We need to call him, Does he have insurance? (he can use a copay card) If no insurance, how is he getting meds? Can we get him seen by med management/[facility]. Peraps he can get assistance with his meds

## 2019-12-31 NOTE — Telephone Encounter (Signed)
Spoke with patient and he states that he was able to get copay card for medication and no longer needs anything. He did report new insurance which they didn't scan last visit. Advised that when he comes in next week for appointment to please let them know so they can scan his new card. He verbalized understanding with no further questions at this time.

## 2020-01-01 ENCOUNTER — Encounter: Admission: RE | Admit: 2020-01-01 | Payer: 59 | Source: Ambulatory Visit

## 2020-01-06 ENCOUNTER — Ambulatory Visit: Payer: 59 | Admitting: Family

## 2020-01-15 ENCOUNTER — Encounter: Payer: Self-pay | Admitting: Physician Assistant

## 2020-01-15 ENCOUNTER — Encounter
Admission: RE | Admit: 2020-01-15 | Discharge: 2020-01-15 | Disposition: A | Discharge status: Home / Self Care | Payer: 59 | Source: Ambulatory Visit | Attending: Physician Assistant | Admitting: Physician Assistant

## 2020-01-15 ENCOUNTER — Telehealth: Payer: Self-pay

## 2020-01-15 ENCOUNTER — Other Ambulatory Visit: Payer: Self-pay

## 2020-01-15 DIAGNOSIS — I4821 Permanent atrial fibrillation: Secondary | ICD-10-CM | POA: Diagnosis present

## 2020-01-15 DIAGNOSIS — Z024 Encounter for examination for driving license: Secondary | ICD-10-CM | POA: Insufficient documentation

## 2020-01-15 LAB — NM MYOCAR MULTI W/SPECT W/WALL MOTION / EF
Estimated workload: 1 METS
Exercise duration (min): 0 min
Exercise duration (sec): 0 s
LV dias vol: 44 mL (ref 62–150)
LV sys vol: 14 mL
MPHR: 167 {beats}/min
Peak HR: 150 {beats}/min
Percent HR: 89 %
Rest HR: 86 {beats}/min
SDS: 1
SRS: 0
SSS: 3
TID: 0.83

## 2020-01-15 MED ORDER — TECHNETIUM TC 99M TETROFOSMIN IV KIT
10.0000 | PACK | Freq: Once | INTRAVENOUS | Status: AC | PRN
  Administered 2020-01-15: 9.96 via INTRAVENOUS

## 2020-01-15 MED ORDER — TECHNETIUM TC 99M TETROFOSMIN IV KIT
32.6200 | PACK | Freq: Once | INTRAVENOUS | Status: AC | PRN
  Administered 2020-01-15: 32.62 via INTRAVENOUS

## 2020-01-15 MED ORDER — REGADENOSON 0.4 MG/5ML IV SOLN
0.4000 mg | Freq: Once | INTRAVENOUS | Status: AC
  Administered 2020-01-15: 0.4 mg via INTRAVENOUS

## 2020-01-15 NOTE — Telephone Encounter (Signed)
Called to give the patient stress test results and Marisue Ivan, PA/Dr. Merita Norton recommendation. Lmtcb. Results will be released to mychart.  JV has drafted the patients DOT letter. Patient is to call back to let us know if he would like to pick it up or if it schould be mailed

## 2020-01-15 NOTE — Telephone Encounter (Signed)
-----   Message from [name], PA-C sent at 01/15/2020  4:32 PM EDT ----- [facility] news! Please let Stanley Kane know that his stress test was normal and ruled a low risk study.   His pump function appeared mildly decreased by stress test measurement; however, visually, it appeared within normal range. Therefore, recommendation by Dr. Azucena Cecil was to obtain a limited echo to confirm his EF (confirm if normal pump function). If agreeable, please schedule him for a limited echo.  Please let him know that I have printed and signed his DOT letter, as no further cardiac testing is needed to obtain DOT certification at this time from our standpoint.  In other words, he does not need to first complete the limited echo to renew his DOT license. I will leave this letter with triage.

## 2020-01-16 NOTE — Telephone Encounter (Signed)
No answer. Left message to call back.   

## 2020-01-20 ENCOUNTER — Ambulatory Visit (INDEPENDENT_AMBULATORY_CARE_PROVIDER_SITE_OTHER): Payer: 59 | Admitting: Family

## 2020-01-20 ENCOUNTER — Encounter: Payer: Self-pay | Admitting: Family

## 2020-01-20 ENCOUNTER — Other Ambulatory Visit: Payer: Self-pay

## 2020-01-20 VITALS — BP 104/80 | HR 125 | Ht 69.0 in | Wt 220.4 lb

## 2020-01-20 DIAGNOSIS — Z024 Encounter for examination for driving license: Secondary | ICD-10-CM | POA: Diagnosis not present

## 2020-01-20 DIAGNOSIS — I4821 Permanent atrial fibrillation: Secondary | ICD-10-CM

## 2020-01-20 DIAGNOSIS — I420 Dilated cardiomyopathy: Secondary | ICD-10-CM | POA: Diagnosis not present

## 2020-01-20 DIAGNOSIS — Z7901 Long term (current) use of anticoagulants: Secondary | ICD-10-CM

## 2020-01-20 NOTE — Progress Notes (Signed)
Office Visit    Patient Name: Stanley Kane Date of Encounter: 01/20/2020  Primary Care Provider:  [name], MD Primary Cardiologist:  Julien Nordmann, MD Electrophysiologist:  None   Chief Complaint    Stanley Kane is a 54 y.o. male with a hx of atrial fibrillation on Xarelto, mild MR by echo 09/2014, sleep apnea, obesity presents today for follow up after stress test.   Past Medical History    Past Medical History:  Diagnosis Date  . Fatty liver 06/2014   Fatty Liver was a disease that father had  . Mitral regurgitation    a. TTE 2-16: EF 60-65%, mild MR/TR, nl RV sys fxn, nl PASP  . Obesity   . OSA (obstructive sleep apnea)   . Persistent atrial fibrillation (HCC)    a. dx 2013 s/p DCCV; b. recurrent Afib 2016 s/p DCCV 11/25/2014; c. CHADS2VASc 0  . Prediabetes   . 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 CARDIOVASCULAR STRESS TEST  ~2005   thinks at [facility], told normal  . carotid US  08/2014   no plaque  . COLONOSCOPY  2000   WNL, for weight loss after started gym  . MRI  08/2014   brain - WNL, skull base C1/2 articulation  . TONSILLECTOMY AND ADENOIDECTOMY  childhood  . US ECHOCARDIOGRAPHY  08/2014   EF 60%, nl vent fxn, mildly dilated LA, mild MR, mild TR    Allergies  Allergies  Allergen Reactions  . Vicodin [Hydrocodone-Acetaminophen]     Makes him "jittery"    History of Present Illness    Stanley Kane is a 54 y.o. male with a hx of permanent atrial fibrillation on Xarelto, mild MR by echo 09/2014, sleep apnea, obesity, history of cocaine use last seen 12/13/2019 by Leafy Kindle, PA.  He underwent DCCV 10/2014 with recurrent atrial fibrillation 12/2014.  He has remained in atrial fibrillation at subsequent visits leading to discontinuation of flecainide and rate control strategy February 2018.  He underwent calcium scoring 09/2016 given atypical  chest pain with score of 0.  In 2019 he underwent nuclear medicine study without ischemia or scar but EF reduced likely due to A. fib.  He was seen January 2018 the setting of reported cocaine use.  March 2019 echo with LVEF 45 to 50%, mild LAE.  He was seen 12/13/2019 needing annual follow-up and DOT clearance.  He reported no cardiac symptoms.  He was noted not be taking his Xarelto due to cost issues which have since been resolved.  Underwent Lexiscan Myoview 01/15/2020 which was low risk with no evidence for ischemia with LVEF mildly decreased 46% although visually appearing normal.  He was recommended to confirm EF by echocardiogram but cleared for DOT.  We reviewed the results of his Lexiscan in depth.  Discussed that he was cleared for DOT.  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  We discussed recommendation for echocardiogram for reassessment of LVEF though this does not preclude DOT clearance.  He is agreeable to schedule.  Reports no shortness of breath nor dyspnea on exertion. Reports no chest pain, pressure, or tightness. No edema, orthopnea, PND. Reports no palpitations.  Reports no lightheadedness, dizziness, near syncope.  Of note he was running late to his visit this morning and did not take his morning medications.  His heart  rate was elevated at 125 bpm.  He was asked to monitor his heart rate daily at least 1 hour after medications and report if his heart rate is consistently greater than 100 bpm.  EKGs/Labs/Other Studies Reviewed:   The following studies were reviewed today:  Lexiscan Myoview 01/15/20 The study is normal. This is a low risk study. The left ventricular ejection fraction is mildly decreased (46%) on measurment. Although visually, EF appears normal. Recommend comfirming EF with Echo There is no evidence for ischemia  EKG:  EKG is  ordered today.  The ekg ordered today demonstrates atrial fibrillation 125 bpm with no acute  ST/T wave changes. Of note, he did not take his morning medications.   Recent Labs: 12/13/2019: BUN 14; Creatinine, Ser 1.15; Hemoglobin 16.8; Platelets 399; Potassium 5.0; Sodium 139  Recent Lipid Panel    Component Value Date/Time   CHOL 177 09/08/2015 1036   CHOL 157 09/25/2014 1731   CHOL 175 10/10/2011 0000   TRIG 124.0 09/08/2015 1036   TRIG 250 (H) 09/25/2014 1731   TRIG 93 10/10/2011 0000   HDL 49.80 09/08/2015 1036   HDL 33 (L) 09/25/2014 1731   CHOLHDL 4 09/08/2015 1036   VLDL 24.8 09/08/2015 1036   VLDL 50 (H) 09/25/2014 1731   LDLCALC 103 (H) 09/08/2015 1036   LDLCALC 74 09/25/2014 1731   LDLDIRECT 105 10/10/2011 0000    Home Medications   Current Meds  Medication Sig  . digoxin (LANOXIN) 0.125 MG tablet Take 1 tablet (125 mcg total) by mouth daily.  . furosemide (LASIX) 20 MG tablet Take 1 tablet (20 mg total) by mouth as needed for edema.  . metoprolol succinate (TOPROL-XL) 25 MG 24 hr tablet Take 1 tablet (25 mg total) by mouth daily.  Marland Kitchen PARoxetine (PAXIL) 40 MG tablet TAKE ONE TABLET EVERY MORNING  . rivaroxaban (XARELTO) 20 MG TABS tablet TAKE 1 TABLET BY MOUTH DAILY WITH SUPPER    Review of Systems    Review of Systems  Constitution: Negative for chills, fever and malaise/fatigue.  Cardiovascular: Negative for chest pain, dyspnea on exertion, irregular heartbeat, leg swelling, near-syncope, orthopnea, palpitations and syncope.  Respiratory: Negative for cough, shortness of breath and wheezing.   Gastrointestinal: Negative for melena, nausea and vomiting.  Genitourinary: Negative for hematuria.  Neurological: Negative for dizziness, light-headedness and weakness.   All other systems reviewed and are otherwise negative except as noted above.  Physical Exam    VS:  BP 104/80 (BP Location: Left Arm, Patient Position: Sitting, Cuff Size: Normal)   Pulse (!) 125   Ht 5\' 9"  (1.753 m)   Wt 220 lb 6 oz (100 kg)   SpO2 98%   BMI 32.54 kg/m  , BMI Body mass  index is 32.54 kg/m. GEN: Well nourished, overweight, well developed, in no acute distress. HEENT: normal. Neck: Supple, no JVD, carotid bruits, or masses. Cardiac: irregularly irregular, no murmurs, rubs, or gallops. No clubbing, cyanosis, edema.  Radials/DP/PT 2+ and equal bilaterally.  Respiratory:  Respirations regular and unlabored, clear to auscultation bilaterally. GI: Soft, nontender, nondistended. MS: No deformity or atrophy. Skin: Warm and dry, no rash. Neuro:  Strength and sensation are intact. Psych: Normal affect.  Assessment & Plan    1. DOT - Works as long . Lexiscan myoview 01/15/20 low risk study with no evidence of ischemia and LVEF >40%. He has permanent rate controlled atrial fibrillation which is controlled on Toprol and Xarelto. Reports compliance with his medications. Letter provided  for DOT clearance.   2. Permanent atrial fibrillation - EKG today shows atrial fibrillation rate 125 bpm. Did not take his Toprol nor Digoxin this morning as he was running late. Reports home HR in the 90s. Recommend keeping a log and calling for HR consistently >100bpm as we could consider increasing Toprol though will need to monitor BP carefully due to low normal BP and hx of hypotension.   3. Chronic anticoagulation - Secondary to permanent atrial fibrillation. Denies bleeding complications. 12/13/19 Hb 16.8. Continue Xarelto 20mg  daily. Has been able to receive coupon and insurance assistance so his copay is $10.   4. Dilated cardiomyopathy - Echo 10/2017 LVEF 45-50%. Volume status well controlled on Lasix once per month. No orthopnea, PND, DOE. Recent Lexiscan myoview with LVEF 48%. Echo ordered for reassessment of LVEF. If LVEF diminished, consider increased dose Toprol, as above.   5. Hypotension - Low normal BP today with no lightheadedness, dizziness. Continue to monitor.   6. Sleep apnea - Follow with PCP.  Disposition: Follow up in 1 year(s) with Dr. Rockey Situ  or APP.    Loel Dubonnet, NP 01/20/2020, 11:06 AM

## 2020-01-20 NOTE — Telephone Encounter (Signed)
Patient had office today, closing this encounter.

## 2020-01-20 NOTE — Patient Instructions (Addendum)
Medication Instructions:  No medication changes today.   *If you need a refill on your cardiac medications before your next appointment, please call your pharmacy*  Lab Work: No lab work today.   If you have labs (blood work) drawn today and your tests are completely normal, you will receive your results only by: Marland Kitchen MyChart Message (if you have MyChart) OR . A paper copy in the mail If you have any lab test that is abnormal or we need to change your treatment, we will call you to review the results.   Testing/Procedures: Your EKG today shows atrial fibrillation   Your physician has requested that you have an echocardiogram. Echocardiography is a painless test that uses sound waves to create images of your heart. It provides your doctor with information about the size and shape of your heart and how well your heart's chambers and valves are working. This procedure takes approximately one hour. There are no restrictions for this procedure. This will let us assess your pumping function.  You may get an IV, if needed, to receive an ultrasound enhancing agent through to better visualize your heart.    Follow-Up: At [facility], you and your health needs are our priority.  As part of our continuing mission to provide you with exceptional heart care, we have created designated Provider Care Teams.  These Care Teams include your primary Cardiologist (physician) and Advanced Practice Providers (APPs -  Physician Assistants and Nurse Practitioners) who all work together to provide you with the care you need, when you need it.  We recommend signing up for the patient portal called "MyChart".  Sign up information is provided on this After Visit Summary.  MyChart is used to connect with patients for Virtual Visits (Telemedicine).  Patients are able to view lab/test results, encounter notes, upcoming appointments, etc.  Non-urgent messages can be sent to your provider as well.   To learn more about what  you can do with MyChart, go to [email or web address].    Your next appointment:   1 year(s)  The format for your next appointment:   In Person  Provider:   You may see Julien Nordmann, MD or one of the following Advanced Practice Providers on your designated Care Team:    Nicolasa Ducking, NP  [name], PA-C  Marisue Ivan, PA-C  Gillian Shields, NP  Other Instructions  If your heart rate is consistently more than 100bpm, please call us and let us know.

## 2020-02-24 ENCOUNTER — Other Ambulatory Visit: Payer: Self-pay | Admitting: Family Medicine

## 2020-02-24 ENCOUNTER — Other Ambulatory Visit: Payer: Self-pay | Admitting: Cardiovascular Disease

## 2020-02-25 NOTE — Telephone Encounter (Signed)
Xarelto 20mg  refill request received. Pt is 54 years old, weight-100kg, Crea-1.15 on 12/13/2019, last seen by 12/15/2019, NP on 01/20/2020, Diagnosis-Afib, CrCl-105.55ml/min; Dose is appropriate based on dosing criteria. Will send in refill to requested pharmacy.

## 2020-02-25 NOTE — Telephone Encounter (Signed)
Please review for refill. Thanks!  

## 2020-03-11 ENCOUNTER — Other Ambulatory Visit: Payer: Self-pay | Admitting: Family Medicine

## 2020-03-12 NOTE — Telephone Encounter (Signed)
Denied refill, too early.  Plz call to schedule cpe and lab visits.

## 2020-03-17 NOTE — Telephone Encounter (Signed)
Tried calling patient and number was invalid. I mailed out letter letting patient know to call office to set up cpe and labs.

## 2020-03-19 ENCOUNTER — Other Ambulatory Visit: Payer: 59

## 2020-04-24 ENCOUNTER — Other Ambulatory Visit: Payer: 59

## 2020-05-25 ENCOUNTER — Telehealth: Payer: Self-pay

## 2020-05-25 NOTE — Telephone Encounter (Signed)
Pt left v/m; pt already has FU appt scheduled on 06/10/20 with DR G. Pt wants to know Dr Timoteo Expose opinion about pt getting covid vaccine; pt is presently on paroxetine. Pt has already gotten ok from cardiology to have covid vaccine. Pt also wants to know if approved do we give vaccine at [facility]. Pt request cb after reviewed by Dr Reece Agar.

## 2020-05-25 NOTE — Telephone Encounter (Signed)
Spoke with pt relaying Dr. Timoteo Expose message.  Pt verbalizes understanding and will get vaccine when he returns home.

## 2020-05-25 NOTE — Telephone Encounter (Signed)
I do recommend covid vaccine as best way to prevent severe disease and complications of covid.  Glad he got approval from cards as well.  We do not offer vaccines in office. He can check at local pharmacy to get it there or online at [email or web address] for local sites.

## 2020-06-05 ENCOUNTER — Other Ambulatory Visit: Payer: Self-pay | Admitting: Family Medicine

## 2020-06-10 ENCOUNTER — Encounter: Payer: Self-pay | Admitting: Family Medicine

## 2020-06-10 ENCOUNTER — Telehealth (INDEPENDENT_AMBULATORY_CARE_PROVIDER_SITE_OTHER): Payer: 59 | Admitting: Family Medicine

## 2020-06-10 VITALS — BP 104/70 | Temp 98.0°F | Ht 69.0 in | Wt 235.0 lb

## 2020-06-10 DIAGNOSIS — I480 Paroxysmal atrial fibrillation: Secondary | ICD-10-CM | POA: Diagnosis not present

## 2020-06-10 DIAGNOSIS — G4733 Obstructive sleep apnea (adult) (pediatric): Secondary | ICD-10-CM

## 2020-06-10 DIAGNOSIS — Z1211 Encounter for screening for malignant neoplasm of colon: Secondary | ICD-10-CM | POA: Diagnosis not present

## 2020-06-10 DIAGNOSIS — F411 Generalized anxiety disorder: Secondary | ICD-10-CM | POA: Diagnosis not present

## 2020-06-10 MED ORDER — PAROXETINE HCL 40 MG PO TABS: 40.0000 mg | ORAL_TABLET | Freq: Every morning | ORAL | 3 refills | Status: DC

## 2020-06-10 NOTE — Progress Notes (Signed)
Virtual visit attempted through MyChart, a video enabled telemedicine application. Due to national recommendations of social distancing due to COVID-19, a virtual visit is felt to be most appropriate for this patient at this time. Reviewed limitations, risks, security and privacy concerns of performing a virtual visit and the availability of in person appointments. I also reviewed that there may be a patient responsible charge related to this service. The patient agreed to proceed.  Interactive audio and video telecommunications were attempted between myself and Stanley Kane, however failed due to patient having technical difficulties OR patient not having access to video capability.  We continued and completed visit with audio only.  Time: 8:23am - 8:31am   Patient location: home  Provider location: [place] [facility], office  Persons participating in this virtual visit: patient, provider   If any vitals were documented, they were collected by patient at home unless specified below.   BP 104/70   Temp 98 F (36.7 C)   Ht 5\' 9"  (1.753 m)   Wt 235 lb (106.6 kg)   BMI 34.70 kg/m    CC: mood f/u Subjective:    Patient ID: Stanley Kane, male    DOB: [birth year], 54 y.o.   MRN: [id number]  HPI: JVEON POUND is a 54 y.o. male presenting on 06/10/2020 for Anxiety (Follow up.)   Pt overslept for in-office appt today - converted to virtual visit. Last seen here 11/2018. 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   Regularly sees cardiology for permanent afib on xarelto with dilated cardiomyopathy, latest note reviewed (12/2019). Continues lasix, digoxin, toprol XL as well. Rpt echo ordered, not completed yet. Also h/o OSA but has never had a sleep study before. Declines referral.   GAD - stable period on paxil 40mg  daily, started 06/2015. Denies significant depression, anxiety. Sleeping well. Appetite ok.   Overdue for CPE - will send iFOB for colon cancer screening and have him return for  in-office physical.       Relevant past medical, surgical, family and social history reviewed and updated as indicated. Interim medical history since our last visit reviewed. Allergies and medications reviewed and updated. Outpatient Medications Prior to Visit  Medication Sig Dispense Refill  . digoxin (LANOXIN) 0.125 MG tablet Take 1 tablet (125 mcg total) by mouth daily. 90 tablet 3  . furosemide (LASIX) 20 MG tablet Take 1 tablet (20 mg total) by mouth as needed for edema. 90 tablet 3  . metoprolol succinate (TOPROL-XL) 25 MG 24 hr tablet Take 1 tablet (25 mg total) by mouth daily. 90 tablet 3  . XARELTO 20 MG TABS tablet TAKE 1 TABLET BY MOUTH DAILY WITH SUPPER 90 tablet 1  . PARoxetine (PAXIL) 40 MG tablet TAKE ONE TABLET EVERY MORNING 90 tablet 0   No facility-administered medications prior to visit.     Per HPI unless specifically indicated in ROS section below Review of Systems Objective:  BP 104/70   Temp 98 F (36.7 C)   Ht 5\' 9"  (1.753 m)   Wt 235 lb (106.6 kg)   BMI 34.70 kg/m   Wt Readings from Last 3 Encounters:  06/10/20 235 lb (106.6 kg)  01/20/20 220 lb 6 oz (100 kg)  12/13/19 229 lb (103.9 kg)       Physical exam: Gen: alert, NAD, not ill appearing Pulm: speaks in complete sentences without increased work of breathing Psych: normal mood, normal thought content      Depression screen [facility] 2/9 06/10/2020  08/14/2017 08/14/2017  Decreased Interest 0 0 0  Down, Depressed, Hopeless 0 0 0  PHQ - 2 Score 0 0 0  Altered sleeping 0 0 -  Tired, decreased energy 0 1 -  Change in appetite 0 0 -  Feeling bad or failure about yourself  0 - -  Trouble concentrating 0 0 -  Moving slowly or fidgety/restless 0 0 -  Suicidal thoughts 0 0 -  PHQ-9 Score 0 1 -  Difficult doing work/chores - Somewhat difficult -   GAD 7 : Generalized Anxiety Score 06/10/2020 08/14/2017  Nervous, Anxious, on Edge 0 0  Control/stop worrying 0 0  Worry too much - different things 0 2    Trouble relaxing 0 0  Restless 0 0  Easily annoyed or irritable 0 1  Afraid - awful might happen 0 0  Total GAD 7 Score 0 3   Assessment & Plan:   Asked to return for physical with fasting labs as overdue.   Problem List Items Addressed This Visit    Paroxysmal atrial fibrillation (HCC)    Encouraged cards f/u and to reschedule echocardiogram ordered 12/2019.       Obstructive sleep apnea    Discussed risks of untreated OSA.  He declines sleep study at this time.       GAD (generalized anxiety disorder) - Primary    Stable period on paxil 40mg  daily -refilled for a year.       Relevant Medications   PARoxetine (PAXIL) 40 MG tablet    Other Visit Diagnoses    Special screening for malignant neoplasms, colon       Relevant Orders   Fecal occult blood, imunochemical       Meds ordered this encounter  Medications  . PARoxetine (PAXIL) 40 MG tablet    Sig: Take 1 tablet (40 mg total) by mouth every morning.    Dispense:  90 tablet    Refill:  3   Orders Placed This Encounter  Procedures  . Fecal occult blood, imunochemical    Standing Status:   Future    Standing Expiration Date:   06/10/2021    I discussed the assessment and treatment plan with the patient. The patient was provided an opportunity to ask questions and all were answered. The patient agreed with the plan and demonstrated an understanding of the instructions. The patient was advised to call back or seek an in-person evaluation if the symptoms worsen or if the condition fails to improve as anticipated.  Follow up plan: No follow-ups on file.  06/12/2021, MD

## 2020-06-10 NOTE — Assessment & Plan Note (Signed)
Discussed risks of untreated OSA.  He declines sleep study at this time.

## 2020-06-10 NOTE — Assessment & Plan Note (Signed)
Stable period on paxil 40mg  daily -refilled for a year.

## 2020-06-10 NOTE — Assessment & Plan Note (Signed)
Encouraged cards f/u and to reschedule echocardiogram ordered 12/2019.

## 2020-09-18 ENCOUNTER — Other Ambulatory Visit: Payer: Self-pay | Admitting: Cardiovascular Disease

## 2020-09-18 NOTE — Telephone Encounter (Signed)
Rx request sent to pharmacy.  

## 2020-10-13 ENCOUNTER — Other Ambulatory Visit: Payer: 59

## 2020-10-19 ENCOUNTER — Ambulatory Visit: Payer: 59 | Admitting: Family Medicine

## 2020-10-19 DIAGNOSIS — Z0289 Encounter for other administrative examinations: Secondary | ICD-10-CM

## 2020-11-18 ENCOUNTER — Telehealth: Payer: Self-pay | Admitting: Pharmacy Technician

## 2020-11-18 NOTE — Telephone Encounter (Signed)
To close encounter  Stanley Kane Care Manager [facility]

## 2020-12-11 ENCOUNTER — Other Ambulatory Visit: Payer: 59

## 2020-12-14 ENCOUNTER — Telehealth: Payer: Self-pay | Admitting: Cardiovascular Disease

## 2020-12-14 NOTE — Telephone Encounter (Signed)
Patient called to reschedule. lmov .  Patient needs appt after echo.

## 2020-12-15 ENCOUNTER — Ambulatory Visit: Payer: 59 | Admitting: Cardiovascular Disease

## 2020-12-18 ENCOUNTER — Other Ambulatory Visit: Payer: 59

## 2020-12-21 ENCOUNTER — Encounter: Payer: 59 | Admitting: Family Medicine

## 2020-12-24 ENCOUNTER — Other Ambulatory Visit: Payer: Self-pay | Admitting: Cardiovascular Disease

## 2020-12-24 NOTE — Telephone Encounter (Signed)
Rx request sent to pharmacy.  

## 2021-01-04 ENCOUNTER — Other Ambulatory Visit: Payer: Self-pay | Admitting: Cardiovascular Disease

## 2021-01-12 ENCOUNTER — Telehealth: Payer: Self-pay | Admitting: *Deleted

## 2021-01-12 DIAGNOSIS — I420 Dilated cardiomyopathy: Secondary | ICD-10-CM

## 2021-01-12 NOTE — Telephone Encounter (Signed)
-----   Message from Barrie Dunker sent at 01/12/2021  2:29 PM EDT ----- Regarding: Need echo order Echo order has expired please put in. thanks

## 2021-01-12 NOTE — Telephone Encounter (Signed)
Order has been placed and patient has been scheduled.

## 2021-01-20 ENCOUNTER — Encounter: Payer: 59 | Admitting: Family Medicine

## 2021-02-16 ENCOUNTER — Other Ambulatory Visit: Payer: 59

## 2021-02-17 ENCOUNTER — Other Ambulatory Visit: Payer: 59

## 2021-02-18 ENCOUNTER — Other Ambulatory Visit: Payer: Self-pay | Admitting: Cardiovascular Disease

## 2021-02-18 MED ORDER — RIVAROXABAN 20 MG PO TABS: 20.0000 mg | ORAL_TABLET | Freq: Every day | ORAL | 1 refills | Status: DC

## 2021-02-18 MED ORDER — FUROSEMIDE 20 MG PO TABS: 20.0000 mg | ORAL_TABLET | ORAL | 3 refills | Status: DC | PRN

## 2021-02-18 MED ORDER — METOPROLOL SUCCINATE ER 25 MG PO TB24
ORAL_TABLET | ORAL | 3 refills | Status: DC
Start: 2021-02-18 — End: 2021-05-21

## 2021-02-18 MED ORDER — DIGOXIN 125 MCG PO TABS
125.0000 ug | ORAL_TABLET | Freq: Every day | ORAL | 0 refills | Status: DC
Start: 2021-02-18 — End: 2021-03-19

## 2021-02-18 NOTE — Telephone Encounter (Signed)
*  STAT* If patient is at the pharmacy, call can be transferred to refill team.   1. Which medications need to be refilled? (please list name of each medication and dose if known)   Digoxin Furosemide Metoprolol Xarelto   2. Which pharmacy/location (including street and city if local pharmacy) is medication to be sent to? [facility]   3. Do they need a 30 day or 90 day supply? 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

## 2021-02-18 NOTE — Telephone Encounter (Signed)
37m, 100kg, Creatinine, Serum 1.150 mg/ 12/13/2019, lovw/walker 01/20/20, ccr 103.9

## 2021-03-19 ENCOUNTER — Other Ambulatory Visit: Payer: Self-pay | Admitting: *Deleted

## 2021-03-19 MED ORDER — DIGOXIN 125 MCG PO TABS: 125.0000 ug | ORAL_TABLET | Freq: Every day | ORAL | 0 refills | Status: DC

## 2021-04-07 ENCOUNTER — Encounter: Payer: 59 | Admitting: Family Medicine

## 2021-04-09 ENCOUNTER — Telehealth: Payer: Self-pay | Admitting: Cardiovascular Disease

## 2021-04-09 ENCOUNTER — Other Ambulatory Visit: Payer: Self-pay | Admitting: Cardiovascular Disease

## 2021-04-09 NOTE — Telephone Encounter (Signed)
*  STAT* If patient is at the pharmacy, call can be transferred to refill team.   1. Which medications need to be refilled? (please list name of each medication and dose if known)   Digoxin 0.125 mg po q d  Xarelto 20 mg po q d   2. Which pharmacy/location (including street and city if local pharmacy) is medication to be sent to?  [facility]   3. Do they need a 30 day or 90 day supply? 30  Patient leaving Monday for work and will return in 5 weeks.    Patient only has 3 week supply on hand  Patient advised not to miss scheduled visit 9/20 .

## 2021-04-12 ENCOUNTER — Other Ambulatory Visit: Payer: 59

## 2021-04-13 ENCOUNTER — Ambulatory Visit: Payer: 59 | Admitting: Physician Assistant

## 2021-05-06 ENCOUNTER — Other Ambulatory Visit: Payer: Self-pay | Admitting: Cardiovascular Disease

## 2021-05-18 ENCOUNTER — Ambulatory Visit: Payer: 59 | Admitting: Cardiovascular Disease

## 2021-05-18 ENCOUNTER — Ambulatory Visit: Payer: 59

## 2021-05-19 ENCOUNTER — Telehealth: Payer: Self-pay | Admitting: Cardiovascular Disease

## 2021-05-19 NOTE — Telephone Encounter (Signed)
*  STAT* If patient is at the pharmacy, call can be transferred to refill team.   1. Which medications need to be refilled? (please list name of each medication and dose if known) cardiac medications   2. Which pharmacy/location (including street and city if local pharmacy) is medication to be sent to? [facility]  3. Do they need a 30 day or 90 day supply? 90 day   Patient scheduled to see R. Dunn on 09/23

## 2021-05-19 NOTE — Telephone Encounter (Signed)
Refill request can be granted at the the patients 05/21/21 appt.

## 2021-05-19 NOTE — Telephone Encounter (Signed)
Please advise if you would like me to refill all cardiac medication or wait until he sees [name], [name].  Looks like Stanley Kane has scheduled before, got the refills, and not came.

## 2021-05-20 NOTE — Progress Notes (Signed)
Cardiology Office Note    Date:  05/21/2021   ID:  Stanley Kane, DOB 07/06/1966, MRN [id number]  PCP:  Stanley Boyden, MD  Cardiologist:  Stanley Nordmann, MD  Electrophysiologist:  None   Chief Complaint: Follow up  History of Present Illness:   Stanley Kane is a 55 y.o. male with history of permanent A. fib diagnosed in 2013 status post DCCV in 06/2012 with recurrent A. fib status post repeat DCCV in 10/2014 with subsequent recurrence on Xarelto, dilated cardiomyopathy, mild mitral regurgitation, remote cocaine use, and obesity who presents for follow-up of his cardiomyopathy and A. fib.  Prior echo from 08/2014 showed an EF of 60-65%, mild MR, mild TR, normal RVSF and PASP.  He was noted to be back in Afib in 2016 in the setting of medication noncompliance. Following resumption of medications, he underwent briefly successful DCCV on 11/25/2014. He was again noted to be back in Afib in 12/2014 and has remained in Afib in subsequent office visits leading to the discontinuation of flecainide and rate control strategy in 09/2016. He underwent calcium scoring in 09/2016 given atypical chest pain with a score of 0.   He underwent nuclear stress testing, in 08/2017, to evaluate for high risk ischemia (with plans to consider rhythm control strategy) that was negative for evidence of ischemia or scar. EF was reduced, felt to be underestimated given Afib. We followed this up with an echo in 10/2017 that showed an EF of 45-50%, no RWMA, mildly dilated left atrium measuring 42 mm, RVSF and PASP were normal.  He was seen in 11/2019 needing annual follow-up for DOT clearance.  He was doing well from a cardiac perspective.  He was noted to not be taking Xarelto due to financial issues.  She underwent Lexiscan MPI in 12/2019 which was a low risk and without evidence of ischemia.  LVEF was mildly decreased at 46%, though visually appeared to be normal.  Echo was recommended to confirm EF, though remains pending.  He  was last seen in the office in 12/2019 and was doing well from a cardiac perspective.  He was noted to be tachycardic with a rate of 125, though reported he was in a hurry and had not yet taken his medications that day.  He was cleared for DOT driving.  He comes in doing reasonably well from a cardiac perspective.  He does note a mild increase in shortness of breath over the past several months.  No chest pain, presyncope, or syncope.  His weight has been overall relatively stable.  No lower extremity swelling, abdominal distention, or orthopnea.  He reports his heart rate is typically in the mid 90s to 1 teens bpm.  BP is typically in the 90s to low 100s systolic.  He reports adherence to medications.  No falls, hematochezia, melena, hemoptysis, hematemesis, or hematuria.  With his relative hypotension, he has noted some occasional positional dizziness.     Labs independently reviewed: 11/2019 - Hgb 16.8, PLT 399, BUN 14, serum creatinine 1.15, potassium 5.0, digoxin 0.8 08/2017 - albumin 4.1, AST/ALT normal, TSH normal, magnesium 2.2 08/2015 - A1c 5.8, TC 177, TG 124, HDL 49, LDL 103  Past Medical History:  Diagnosis Date   Fatty liver 06/2014   Fatty Liver was a disease that father had   Mitral regurgitation    a. TTE 2-16: EF 60-65%, mild MR/TR, nl RV sys fxn, nl PASP   Obesity    OSA (obstructive sleep apnea)  Persistent atrial fibrillation (HCC)    a. dx 2013 s/p DCCV; b. recurrent Afib 2016 s/p DCCV 11/25/2014; c. 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   hospitalization [facility] - stress with father's illness    Past Surgical History:  Procedure Laterality Date   CARDIOVASCULAR STRESS TEST  ~2005   thinks at [facility], told normal   carotid US  08/2014   no plaque   COLONOSCOPY  2000   WNL, for weight loss after started gym   MRI  08/2014   brain - WNL, skull base C1/2 articulation   TONSILLECTOMY AND ADENOIDECTOMY  childhood   US ECHOCARDIOGRAPHY  08/2014   EF  60%, nl vent fxn, mildly dilated LA, mild MR, mild TR    Current Medications: Current Meds  Medication Sig   digoxin (LANOXIN) 0.125 MG tablet Take 1 tablet (0.125 mg total) by mouth daily. NO FURTHER REFILLS UNTIL SEEN IN CLINIC.   PARoxetine (PAXIL) 40 MG tablet Take 1 tablet (40 mg total) by mouth every morning.   [DISCONTINUED] furosemide (LASIX) 20 MG tablet Take 1 tablet (20 mg total) by mouth as needed for edema.   [DISCONTINUED] metoprolol succinate (TOPROL-XL) 25 MG 24 hr tablet TAKE 1 TABLET BY MOUTH DAILY. SCHEDULE OFFICE VISIT FOR FURTHER REFILLS   [DISCONTINUED] rivaroxaban (XARELTO) 20 MG TABS tablet Take 1 tablet (20 mg total) by mouth daily with supper.    Allergies:   Vicodin [hydrocodone-acetaminophen]   Social History   Socioeconomic History   Marital status: Divorced    Spouse name: Not on file   Number of children: Not on file   Years of education: Not on file   Highest education level: Not on file  Occupational History   Not on file  Tobacco Use   Smoking status: Former    Types: Cigarettes    Quit date: 08/29/1996    Years since quitting: 24.7   Smokeless tobacco: Never  Vaping Use   Vaping Use: Never used  Substance and Sexual Activity   Alcohol use: Not Currently    Alcohol/week: 2.0 standard drinks    Types: 2 Cans of beer per week    Comment: quit beginning of 2020   Drug use: Not Currently    Frequency: 5.0 times per week    Types: Cocaine    Comment: 3-5 grams a day, quit beginning of year 2020    Sexual activity: Yes  Other Topics Concern   Not on file  Social History Narrative   Occupation: truck driver, just started back   Edu: 9th grade, GED   Social Determinants of Corporate investment banker Strain: Not on file  Food Insecurity: Not on file  Transportation Needs: Not on file  Physical Activity: Not on file  Stress: Not on file  Social Connections: Not on file     Family History:  The patient's family history includes CAD  (age of onset: 63) in his sister; Cirrhosis in his father; Coronary artery disease (age of onset: 60) in his father; Diabetes in his mother; GI Disease in his sister; Heart attack in his father; Heart disease in his mother; Hypercalcemia in his father; Hypertension in his father. There is no history of Stroke.  ROS:   Review of Systems  Constitutional:  Positive for malaise/fatigue. Negative for chills, diaphoresis, fever and weight loss.  HENT:  Negative for congestion.   Eyes:  Negative for discharge and redness.  Respiratory:  Positive for shortness of  breath. Negative for cough, hemoptysis, sputum production and wheezing.   Cardiovascular:  Negative for chest pain, palpitations, orthopnea, claudication, leg swelling and PND.  Gastrointestinal:  Negative for abdominal pain, blood in stool, heartburn, melena, nausea and vomiting.       Negative for hematemesis  Genitourinary:  Negative for hematuria.  Musculoskeletal:  Negative for falls and myalgias.  Skin:  Positive for rash.  Neurological:  Negative for dizziness, tingling, tremors, sensory change, speech change, focal weakness, loss of consciousness and weakness.  Endo/Heme/Allergies:  Does not bruise/bleed easily.  Psychiatric/Behavioral:  Negative for substance abuse. The patient is not nervous/anxious.   All other systems reviewed and are negative.   EKGs/Labs/Other Studies Reviewed:    Studies reviewed were summarized above. The additional studies were reviewed today:  Lexiscan MPI 12/2019: The study is normal. This is a low risk study. The left ventricular ejection fraction is mildly decreased (46%) on measurment. Although visually, EF appears normal. Recommend comfirming EF with Echo There is no evidence for ischemia __________  2D echo 10/2017: - Left ventricle: The cavity size was normal. Systolic function was    mildly reduced. The estimated ejection fraction was in the range    of 45% to 50%. Wall motion was normal;  there were no regional    wall motion abnormalities. The study is not technically    sufficient to allow evaluation of LV diastolic function.  - Left atrium: The atrium was mildly dilated.  - Right ventricle: Systolic function was normal.  - Pulmonary arteries: Systolic pressure was within the normal    range.   Impressions:   - Atrial fibrillation rate 100 to 120 bpm. __________  Eugenie Birks MPI 08/2017: Abnormal, intermediate risk myocardial perfusion stress test due to reduced LVEF. Normal myocardial perfusion without evidence of ischemia or scar. Left ventricular ejection fraction is moderately to severely reduced (33% by QGS, 44% by Siemens calculation), though it appears better by visual estimation. LVEF may be underestimated due to atrial fibrillation. __________    EKG:  EKG is ordered today.  The EKG ordered today demonstrates A. fib with RVR, 119 bpm, no acute ST-T changes  Recent Labs: No results found for requested labs within last 8760 hours.  Recent Lipid Panel    Component Value Date/Time   CHOL 177 09/08/2015 1036   CHOL 157 09/25/2014 1731   CHOL 175 10/10/2011 0000   TRIG 124.0 09/08/2015 1036   TRIG 250 (H) 09/25/2014 1731   TRIG 93 10/10/2011 0000   HDL 49.80 09/08/2015 1036   HDL 33 (L) 09/25/2014 1731   CHOLHDL 4 09/08/2015 1036   VLDL 24.8 09/08/2015 1036   VLDL 50 (H) 09/25/2014 1731   LDLCALC 103 (H) 09/08/2015 1036   LDLCALC 74 09/25/2014 1731   LDLDIRECT 105 10/10/2011 0000    PHYSICAL EXAM:    VS:  BP (!) 88/60 (BP Location: Right Arm, Patient Position: Sitting, Cuff Size: Normal)   Pulse (!) 119   Ht 5\' 9"  (1.753 m)   Wt 225 lb (102.1 kg)   SpO2 98%   BMI 33.23 kg/m   BMI: Body mass index is 33.23 kg/m.  Physical Exam Vitals reviewed.  Constitutional:      Appearance: He is well-developed.  HENT:     Head: Normocephalic and atraumatic.  Eyes:     General:        Right eye: No discharge.        Left eye: No discharge.  Neck:  Vascular: No JVD.  Cardiovascular:     Rate and Rhythm: Tachycardia present. Rhythm irregularly irregular.     Pulses:          Posterior tibial pulses are 2+ on the right side and 2+ on the left side.     Heart sounds: Normal heart sounds, S1 normal and S2 normal. Heart sounds not distant. No midsystolic click and no opening snap. No murmur heard.   No friction rub.  Pulmonary:     Effort: Pulmonary effort is normal. No respiratory distress.     Breath sounds: Normal breath sounds. No decreased breath sounds, wheezing or rales.  Chest:     Chest wall: No tenderness.  Abdominal:     General: There is no distension.     Palpations: Abdomen is soft.     Tenderness: There is no abdominal tenderness.  Musculoskeletal:     Cervical back: Normal range of motion.     Right lower leg: No edema.     Left lower leg: No edema.  Skin:    General: Skin is warm and dry.     Nails: There is no clubbing.  Neurological:     Mental Status: He is alert and oriented to person, place, and time.  Psychiatric:        Speech: Speech normal.        Behavior: Behavior normal.        Thought Content: Thought content normal.        Judgment: Judgment normal.    Wt Readings from Last 3 Encounters:  05/21/21 225 lb (102.1 kg)  06/10/20 235 lb (106.6 kg)  01/20/20 220 lb 6 oz (100 kg)     ASSESSMENT & PLAN:   Permanent A. fib: Ventricular rates remain mildly tachycardic in the 1 teens bpm.  He just took his metoprolol and digoxin approximately an hour before his visit which is likely contributing to his low blood pressure.  Historically, his BP runs in the low 100s systolic.  Continue Toprol-XL and digoxin for rate control.  Unlikely to be successful with rhythm control strategy given it appears he has been in A. fib since 2016.  With difficult to manage A. fib in the context of hypotension and dilated cardiomyopathy, we will refer to EP for further recommendations.  If labs allow, could consider titration  of digoxin, though this is not ideal given he is lost to follow-up at times.  CHA2DS2-VASc at least 1 (CHF).  He remains on Xarelto.  Check CBC, BMP, and digoxin level.  Dilated cardiomyopathy: He appears euvolemic and well compensated.  Continue GDMT including Toprol-XL and digoxin.  Hypotension has precluded escalation of GDMT.  He is scheduled for an echo later this afternoon to evaluate his cardiomyopathy.  CHF education.  Sleep apnea: Denies.  Rash: Appears to be consistent with psoriasis.  Follow-up with PCP.  This was not discussed in detail at today's visit.  Disposition: F/u with Dr. Mariah Milling or an APP in 4 months.   Medication Adjustments/Labs and Tests Ordered: Current medicines are reviewed at length with the patient today.  Concerns regarding medicines are outlined above. Medication changes, Labs and Tests ordered today are summarized above and listed in the Patient Instructions accessible in Encounters.   Signed, [name], PA-C 05/21/2021 2:29 PM     [facility] [place] [address] [place], [place] [address] [phone]

## 2021-05-21 ENCOUNTER — Encounter: Payer: Self-pay | Admitting: Physician Assistant

## 2021-05-21 ENCOUNTER — Other Ambulatory Visit: Payer: Self-pay

## 2021-05-21 ENCOUNTER — Other Ambulatory Visit: Payer: Self-pay | Admitting: Cardiovascular Disease

## 2021-05-21 ENCOUNTER — Ambulatory Visit (INDEPENDENT_AMBULATORY_CARE_PROVIDER_SITE_OTHER): Payer: 59 | Admitting: Physician Assistant

## 2021-05-21 ENCOUNTER — Other Ambulatory Visit: Payer: 59

## 2021-05-21 VITALS — BP 88/60 | HR 119 | Ht 69.0 in | Wt 225.0 lb

## 2021-05-21 DIAGNOSIS — R21 Rash and other nonspecific skin eruption: Secondary | ICD-10-CM | POA: Diagnosis not present

## 2021-05-21 DIAGNOSIS — I4821 Permanent atrial fibrillation: Secondary | ICD-10-CM | POA: Diagnosis not present

## 2021-05-21 DIAGNOSIS — I420 Dilated cardiomyopathy: Secondary | ICD-10-CM

## 2021-05-21 MED ORDER — METOPROLOL SUCCINATE ER 25 MG PO TB24: ORAL_TABLET | ORAL | 3 refills | Status: DC

## 2021-05-21 MED ORDER — FUROSEMIDE 20 MG PO TABS: 20.0000 mg | ORAL_TABLET | ORAL | 3 refills | Status: DC | PRN

## 2021-05-21 MED ORDER — RIVAROXABAN 20 MG PO TABS: 20.0000 mg | ORAL_TABLET | Freq: Every day | ORAL | 1 refills | Status: DC

## 2021-05-21 MED ORDER — RIVAROXABAN 20 MG PO TABS: 20.0000 mg | ORAL_TABLET | Freq: Every day | ORAL | 3 refills | Status: DC

## 2021-05-21 NOTE — Addendum Note (Signed)
Addended by: Bryna Colander on: 05/21/2021 02:25 PM   Modules accepted: Orders

## 2021-05-21 NOTE — Telephone Encounter (Addendum)
Additional refills sent into pharmacy on review of chart. Left detailed voicemail message that refills were sent in and to give [place] a call if questions.

## 2021-05-21 NOTE — Patient Instructions (Addendum)
Medication Instructions:  Your physician recommends that you continue on your current medications as directed. Please refer to the Current Medication list given to you today.  *If you need a refill on your cardiac medications before your next appointment, please call your pharmacy*   Lab Work: TODAY:  BMET, CBC, & DIGOXIN LEVEL  If you have labs (blood work) drawn today and your tests are completely normal, you will receive your results only by: MyChart Message (if you have MyChart) OR A paper copy in the mail If you have any lab test that is abnormal or we need to change your treatment, we will call you to review the results.   Testing/Procedures: None ordered  You have been referred to see one of our Electrophysiologists for A-Fib.  Follow-Up: At [facility], you and your health needs are our priority.  As part of our continuing mission to provide you with exceptional heart care, we have created designated Provider Care Teams.  These Care Teams include your primary Cardiologist (physician) and Advanced Practice Providers (APPs -  Physician Assistants and Nurse Practitioners) who all work together to provide you with the care you need, when you need it.  We recommend signing up for the patient portal called "MyChart".  Sign up information is provided on this After Visit Summary.  MyChart is used to connect with patients for Virtual Visits (Telemedicine).  Patients are able to view lab/test results, encounter notes, upcoming appointments, etc.  Non-urgent messages can be sent to your provider as well.   To learn more about what you can do with MyChart, go to [email or web address].    Your next appointment:   4 month(s)  The format for your next appointment:   In Person  Provider:   You may see Julien Nordmann, MD or one of the following Advanced Practice Providers on your designated Care Team:   Nicolasa Ducking, NP [name], PA-C Marisue Ivan, PA-C Cadence Fransico Michael,  [place]   Other Instructions

## 2021-05-22 LAB — BASIC METABOLIC PANEL
BUN/Creatinine Ratio: 15 (ref 9–20)
BUN: 16 mg/dL (ref 6–24)
CO2: 21 mmol/L (ref 20–29)
Calcium: 9.2 mg/dL (ref 8.7–10.2)
Chloride: 99 mmol/L (ref 96–106)
Creatinine, Ser: 1.07 mg/dL (ref 0.76–1.27)
Glucose: 193 mg/dL — ABNORMAL HIGH (ref 65–99)
Potassium: 3.9 mmol/L (ref 3.5–5.2)
Sodium: 140 mmol/L (ref 134–144)
eGFR: 82 mL/min/{1.73_m2} (ref >59)

## 2021-05-22 LAB — CBC
Hematocrit: 44.7 % (ref 37.5–51.0)
Hemoglobin: 15.2 g/dL (ref 13.0–17.7)
MCH: 31.6 pg (ref 26.6–33.0)
MCHC: 34 g/dL (ref 31.5–35.7)
MCV: 93 fL (ref 79–97)
Platelets: 395 10*3/uL (ref 150–450)
RBC: 4.81 x10E6/uL (ref 4.14–5.80)
RDW: 12.9 % (ref 11.6–15.4)
WBC: 8.5 10*3/uL (ref 3.4–10.8)

## 2021-05-22 LAB — DIGOXIN LEVEL: Digoxin, Serum: 0.6 ng/mL (ref 0.5–0.9)

## 2021-05-24 ENCOUNTER — Telehealth: Payer: Self-pay | Admitting: *Deleted

## 2021-05-24 NOTE — Telephone Encounter (Signed)
Left voicemail message to call back  

## 2021-05-24 NOTE — Telephone Encounter (Signed)
-----   Message from Sondra Barges, PA-C sent at 05/24/2021  7:17 AM EDT ----- Random glucose is elevated Renal function normal Potassium okay Blood count normal Digoxin at goal  Recommendations: -Follow-up with PCP for hyperglycemia

## 2021-05-26 MED ORDER — DIGOXIN 125 MCG PO TABS: 0.1250 mg | ORAL_TABLET | Freq: Every day | ORAL | 3 refills | Status: DC

## 2021-05-26 NOTE — Telephone Encounter (Signed)
Reviewed results and recommendations with patient and he verbalized understanding with no further questions at this time.  

## 2021-06-16 ENCOUNTER — Other Ambulatory Visit: Payer: Self-pay | Admitting: Family Medicine

## 2021-06-16 NOTE — Telephone Encounter (Signed)
E-scribed refill.  Plz schedule cpe for additional refills.

## 2021-06-17 NOTE — Telephone Encounter (Signed)
Pt has apt. On 2.17.23 for cpe/lab . Pt stated he would like refill on PARoxetine (PAXIL) 40 MG tablet

## 2021-06-22 ENCOUNTER — Other Ambulatory Visit: Payer: 59

## 2021-06-24 ENCOUNTER — Encounter: Payer: Self-pay | Admitting: Cardiology

## 2021-06-24 ENCOUNTER — Ambulatory Visit (HOSPITAL_COMMUNITY): Payer: 59 | Attending: Internal Medicine

## 2021-06-24 ENCOUNTER — Institutional Professional Consult (permissible substitution): Payer: 59 | Admitting: Cardiology

## 2021-06-24 NOTE — Progress Notes (Deleted)
Electrophysiology Office Note:    Date:  06/24/2021   ID:  Stanley Kane, DOB 06/03/1966, MRN [id number]  PCP:  [name], MD  [facility] Cardiologist:  Julien Nordmann, MD  [facility] Electrophysiologist:  None   Referring MD: [name], MD   Chief Complaint: Atrial fibrillation  History of Present Illness:    Stanley Kane is a 55 y.o. male who presents for an evaluation of atrial fibrillation at the request of Stanley Listen, PA-C. Their medical history includes mild MR, obesity, persistent atrial fibrillation.  Patient's diagnosis of atrial fibrillation dates back to 2016.  He underwent a cardioversion in March 2016 that was briefly successful but by May of that year he was back in atrial fibrillation.  He is undergone a rate control strategy since 2018 his ejection fraction has slowly down trended.     Past Medical History:  Diagnosis Date   Fatty liver 06/2014   Fatty Liver was a disease that father had   Mitral regurgitation    a. TTE 2-16: EF 60-65%, mild MR/TR, nl RV sys fxn, nl PASP   Obesity    OSA (obstructive sleep apnea)    Persistent atrial fibrillation (HCC)    a. dx 2013 s/p DCCV; b. recurrent Afib 2016 s/p DCCV 11/25/2014; c. 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   hospitalization [facility] - stress with father's illness    Past Surgical History:  Procedure Laterality Date   CARDIOVASCULAR STRESS TEST  ~2005   thinks at [facility], told normal   carotid US  08/2014   no plaque   COLONOSCOPY  2000   WNL, for weight loss after started gym   MRI  08/2014   brain - WNL, skull base C1/2 articulation   TONSILLECTOMY AND ADENOIDECTOMY  childhood   US ECHOCARDIOGRAPHY  08/2014   EF 60%, nl vent fxn, mildly dilated LA, mild MR, mild TR    Current Medications: No outpatient medications have been marked as taking for the 06/24/21 encounter (Appointment) with Lanier Prude, MD.     Allergies:   Vicodin  [hydrocodone-acetaminophen]   Social History   Socioeconomic History   Marital status: Divorced    Spouse name: Not on file   Number of children: Not on file   Years of education: Not on file   Highest education level: Not on file  Occupational History   Not on file  Tobacco Use   Smoking status: Former    Types: Cigarettes    Quit date: 08/29/1996    Years since quitting: 24.8   Smokeless tobacco: Never  Vaping Use   Vaping Use: Never used  Substance and Sexual Activity   Alcohol use: Not Currently    Alcohol/week: 2.0 standard drinks    Types: 2 Cans of beer per week    Comment: quit beginning of 2020   Drug use: Not Currently    Frequency: 5.0 times per week    Types: Cocaine    Comment: 3-5 grams a day, quit beginning of year 2020    Sexual activity: Yes  Other Topics Concern   Not on file  Social History Narrative   Occupation: truck driver, just started back   Edu: 9th grade, GED   Social Determinants of Corporate investment banker Strain: Not on file  Food Insecurity: Not on file  Transportation Needs: Not on file  Physical Activity: Not on file  Stress: Not on file  Social Connections:  Not on file     Family History: The patient's family history includes CAD (age of onset: 68) in his sister; Cirrhosis in his father; Coronary artery disease (age of onset: 65) in his father; Diabetes in his mother; GI Disease in his sister; Heart attack in his father; Heart disease in his mother; Hypercalcemia in his father; Hypertension in his father. There is no history of Stroke.  ROS:   Please see the history of present illness.    All other systems reviewed and are negative.  EKGs/Labs/Other Studies Reviewed:    The following studies were reviewed today:  March 2019 echo Ejection fraction 45% Global hypokinesis Right ventricular function normal Mildly dilated left atrium  May 2021 SPECT EF 45% No evidence for ischemia  EKGs since 2019 showed atrial  fibrillation.  EKG:  The ekg ordered today demonstrates ***   Recent Labs: 05/21/2021: BUN 16; Creatinine, Ser 1.07; Hemoglobin 15.2; Platelets 395; Potassium 3.9; Sodium 140  Recent Lipid Panel    Component Value Date/Time   CHOL 177 09/08/2015 1036   CHOL 157 09/25/2014 1731   CHOL 175 10/10/2011 0000   TRIG 124.0 09/08/2015 1036   TRIG 250 (H) 09/25/2014 1731   TRIG 93 10/10/2011 0000   HDL 49.80 09/08/2015 1036   HDL 33 (L) 09/25/2014 1731   CHOLHDL 4 09/08/2015 1036   VLDL 24.8 09/08/2015 1036   VLDL 50 (H) 09/25/2014 1731   LDLCALC 103 (H) 09/08/2015 1036   LDLCALC 74 09/25/2014 1731   LDLDIRECT 105 10/10/2011 0000    Physical Exam:    VS:  There were no vitals taken for this visit.    Wt Readings from Last 3 Encounters:  05/21/21 225 lb (102.1 kg)  06/10/20 235 lb (106.6 kg)  01/20/20 220 lb 6 oz (100 kg)     GEN: *** Well nourished, well developed in no acute distress HEENT: Normal NECK: No JVD; No carotid bruits LYMPHATICS: No lymphadenopathy CARDIAC: ***RRR, no murmurs, rubs, gallops RESPIRATORY:  Clear to auscultation without rales, wheezing or rhonchi  ABDOMEN: Soft, non-tender, non-distended MUSCULOSKELETAL:  No edema; No deformity  SKIN: Warm and dry NEUROLOGIC:  Alert and oriented x 3 PSYCHIATRIC:  Normal affect       ASSESSMENT:    No diagnosis found. PLAN:    In order of problems listed above:     Digoxin metoprolol  Follow-up as needed    Total time spent with patient today *** minutes. This includes reviewing records, evaluating the patient and coordinating care.  Medication Adjustments/Labs and Tests Ordered: Current medicines are reviewed at length with the patient today.  Concerns regarding medicines are outlined above.  No orders of the defined types were placed in this encounter.  No orders of the defined types were placed in this encounter.    Signed, Rossie Muskrat. Lalla Brothers, MD, [facility], [facility] 06/24/2021 7:49 AM     Electrophysiology [facility]

## 2021-06-24 NOTE — Progress Notes (Unsigned)
Patient ID: Stanley Kane, male   DOB: 30-Jul-1966, 55 y.o.   MRN: [id number]  Verified appointment "no show" status with Lupita Leash at 8:25am.

## 2021-09-13 ENCOUNTER — Encounter: Payer: 59 | Admitting: Family Medicine

## 2021-09-16 ENCOUNTER — Telehealth: Payer: Self-pay | Admitting: Cardiovascular Disease

## 2021-09-16 DIAGNOSIS — I420 Dilated cardiomyopathy: Secondary | ICD-10-CM

## 2021-09-16 NOTE — Telephone Encounter (Signed)
Spoke with patient and reviewed instructions for stress test. Advised that I would send those instructions via My Chart and then have someone call him to give date and time for that test. He did want to do it on specific week given he is out of town. Reviewed that they would give him a call to get that set up. He verbalized understanding of instructions and had no further questions at this time.

## 2021-09-16 NOTE — Telephone Encounter (Signed)
Ok to order Braymer MPI prior to his appointment with Dr. Mariah Milling in 09/2021, if required by his company.

## 2021-09-16 NOTE — Telephone Encounter (Signed)
Patient is calling to schedule a stress test for a DOT exam, please assist

## 2021-09-24 ENCOUNTER — Other Ambulatory Visit: Payer: Self-pay | Admitting: Family Medicine

## 2021-10-11 ENCOUNTER — Ambulatory Visit: Payer: 59

## 2021-10-12 ENCOUNTER — Ambulatory Visit: Payer: 59 | Admitting: Cardiovascular Disease

## 2021-10-13 ENCOUNTER — Encounter: Admission: RE | Admit: 2021-10-13 | Payer: 59 | Source: Ambulatory Visit

## 2021-10-13 NOTE — Progress Notes (Unsigned)
Cardiology Office Note    Date:  10/13/2021   ID:  GEDALYA DECHRISTOPHER, DOB 08/08/1966, MRN [id number]  PCP:  Ria Bush, MD  Cardiologist:  Ida Rogue, MD  Electrophysiologist:  Vickie Epley, MD   Chief Complaint: Follow up  History of Present Illness:   Stanley Kane is a 56 y.o. male with history of permanent A. fib diagnosed in 2013 status post DCCV in 06/2012 with recurrent A. fib status post repeat DCCV in 10/2014 with subsequent recurrence on Xarelto, dilated cardiomyopathy, mild mitral regurgitation, remote cocaine use, and obesity who presents for follow-up of his cardiomyopathy and A. fib.   Prior echo from 08/2014 showed an EF of 60-65%, mild MR, mild TR, normal RVSF and PASP.  He was noted to be back in Afib in 2016 in the setting of medication noncompliance. Following resumption of medications, he underwent briefly successful DCCV on 11/25/2014. He was again noted to be back in Afib in 12/2014 and has remained in Afib in subsequent office visits leading to the discontinuation of flecainide and rate control strategy in 09/2016. He underwent calcium scoring in 09/2016 given atypical chest pain with a score of 0.   He underwent nuclear stress testing, in 08/2017, to evaluate for high risk ischemia (with plans to consider rhythm control strategy) that was negative for evidence of ischemia or scar. EF was reduced, felt to be underestimated given Afib. We followed this up with an echo in 10/2017 that showed an EF of 45-50%, no RWMA, mildly dilated left atrium measuring 42 mm, RVSF and PASP were normal.  He was seen in 11/2019 needing annual follow-up for DOT clearance.  He was doing well from a cardiac perspective.  He was noted to not be taking Xarelto due to financial issues.  She underwent Lexiscan MPI in 12/2019 which was a low risk and without evidence of ischemia.  LVEF was mildly decreased at 46%, though visually appeared to be normal.  Echo was recommended to confirm EF, though  remains pending.  He was seen in the office in 12/2019 and was doing well from a cardiac perspective.  He was noted to be tachycardic with a rate of 125, though reported he was in a hurry and had not yet taken his medications that day.  He was cleared for DOT driving.  He was last seen in 04/2021 and was doing reasonably well from a cardiac perspective.  He was referred to EP for further recommendations regarding difficult to control Afib in the context of hypotension and dilated cardiomyopathy.  Lexiscan MPI, as part of his DOT evaluation on 10/13/2020 showed ***  ***   Labs independently reviewed: 04/2021 - digoxin 0.6, Hgb 15.2, PLT 395, potassium 3.9, BUN 16, SCr 1.07 08/2017 - albumin 4.1, AST/ALT normal, TSH normal, magnesium 2.2 08/2015 - A1c 5.8, TC 177, TG 124, HDL 49, LDL 103  Past Medical History:  Diagnosis Date   Fatty liver 06/2014   Fatty Liver was a disease that father had   Mitral regurgitation    a. TTE 2-16: EF 60-65%, mild MR/TR, nl RV sys fxn, nl PASP   Obesity    OSA (obstructive sleep apnea)    Persistent atrial fibrillation ([place])    a. dx 2013 s/p DCCV; b. recurrent Afib 2016 s/p DCCV 11/25/2014; c. 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   hospitalization [facility] - stress with father's illness    Past Surgical History:  Procedure Laterality Date  CARDIOVASCULAR STRESS TEST  ~2005   thinks at [facility], told normal   carotid US  08/2014   no plaque   COLONOSCOPY  2000   WNL, for weight loss after started gym   MRI  08/2014   brain - WNL, skull base C1/2 articulation   TONSILLECTOMY AND ADENOIDECTOMY  childhood   US ECHOCARDIOGRAPHY  08/2014   EF 60%, nl vent fxn, mildly dilated LA, mild MR, mild TR    Current Medications: No outpatient medications have been marked as taking for the 10/14/21 encounter (Appointment) with Rise Mu, PA-C.    Allergies:   Vicodin [hydrocodone-acetaminophen]   Social History   Socioeconomic History    Marital status: Divorced    Spouse name: Not on file   Number of children: Not on file   Years of education: Not on file   Highest education level: Not on file  Occupational History   Not on file  Tobacco Use   Smoking status: Former    Types: Cigarettes    Quit date: 08/29/1996    Years since quitting: 25.1   Smokeless tobacco: Never  Vaping Use   Vaping Use: Never used  Substance and Sexual Activity   Alcohol use: Not Currently    Alcohol/week: 2.0 standard drinks    Types: 2 Cans of beer per week    Comment: quit beginning of 2020   Drug use: Not Currently    Frequency: 5.0 times per week    Types: Cocaine    Comment: 3-5 grams a day, quit beginning of year 2020    Sexual activity: Yes  Other Topics Concern   Not on file  Social History Narrative   Occupation: truck driver, just started back   Edu: 9th grade, GED   Social Determinants of Radio broadcast assistant Strain: Not on file  Food Insecurity: Not on file  Transportation Needs: Not on file  Physical Activity: Not on file  Stress: Not on file  Social Connections: Not on file     Family History:  The patient's family history includes CAD (age of onset: 25) in his sister; Cirrhosis in his father; Coronary artery disease (age of onset: 16) in his father; Diabetes in his mother; GI Disease in his sister; Heart attack in his father; Heart disease in his mother; Hypercalcemia in his father; Hypertension in his father. There is no history of Stroke.  ROS:   ROS   EKGs/Labs/Other Studies Reviewed:    Studies reviewed were summarized above. The additional studies were reviewed today:  Lexiscan MPI 12/2019: The study is normal. This is a low risk study. The left ventricular ejection fraction is mildly decreased (46%) on measurment. Although visually, EF appears normal. Recommend comfirming EF with Echo There is no evidence for ischemia __________   2D echo 10/2017: - Left ventricle: The cavity size was normal.  Systolic function was    mildly reduced. The estimated ejection fraction was in the range    of 45% to 50%. Wall motion was normal; there were no regional    wall motion abnormalities. The study is not technically    sufficient to allow evaluation of LV diastolic function.  - Left atrium: The atrium was mildly dilated.  - Right ventricle: Systolic function was normal.  - Pulmonary arteries: Systolic pressure was within the normal    range.   Impressions:   - Atrial fibrillation rate 100 to 120 bpm. __________   Carlton Adam MPI 08/2017: Abnormal, intermediate risk myocardial  perfusion stress test due to reduced LVEF. Normal myocardial perfusion without evidence of ischemia or scar. Left ventricular ejection fraction is moderately to severely reduced (33% by QGS, 44% by Siemens calculation), though it appears better by visual estimation. LVEF may be underestimated due to atrial fibrillation.   EKG:  EKG is ordered today.  The EKG ordered today demonstrates ***  Recent Labs: 05/21/2021: BUN 16; Creatinine, Ser 1.07; Hemoglobin 15.2; Platelets 395; Potassium 3.9; Sodium 140  Recent Lipid Panel    Component Value Date/Time   CHOL 177 09/08/2015 1036   CHOL 157 09/25/2014 1731   CHOL 175 10/10/2011 0000   TRIG 124.0 09/08/2015 1036   TRIG 250 (H) 09/25/2014 1731   TRIG 93 10/10/2011 0000   HDL 49.80 09/08/2015 1036   HDL 33 (L) 09/25/2014 1731   CHOLHDL 4 09/08/2015 1036   VLDL 24.8 09/08/2015 1036   VLDL 50 (H) 09/25/2014 1731   LDLCALC 103 (H) 09/08/2015 1036   LDLCALC 74 09/25/2014 1731   LDLDIRECT 105 10/10/2011 0000    PHYSICAL EXAM:    VS:  There were no vitals taken for this visit.  BMI: There is no height or weight on file to calculate BMI.  Physical Exam  Wt Readings from Last 3 Encounters:  05/21/21 225 lb (102.1 kg)  06/10/20 235 lb (106.6 kg)  01/20/20 220 lb 6 oz (100 kg)     ASSESSMENT & PLAN:   Permanent Afib:  Dilated cardiomyopathy:  ***   {Are  you ordering a CV Procedure (e.g. stress test, cath, DCCV, TEE, etc)?   Press F2        :[id number]     Disposition: F/u with Dr. Rockey Situ or an APP in ***, and EP as directed.   Medication Adjustments/Labs and Tests Ordered: Current medicines are reviewed at length with the patient today.  Concerns regarding medicines are outlined above. Medication changes, Labs and Tests ordered today are summarized above and listed in the Patient Instructions accessible in Encounters.   Signed, Christell Faith, PA-C 10/13/2021 7:43 AM     [facility] [address] [place], [place] [address] [phone]

## 2021-10-14 ENCOUNTER — Ambulatory Visit: Payer: 59 | Admitting: Physician Assistant

## 2021-10-15 ENCOUNTER — Encounter: Payer: 59 | Admitting: Family Medicine

## 2021-10-15 ENCOUNTER — Encounter: Payer: Self-pay | Admitting: Physician Assistant

## 2021-11-02 ENCOUNTER — Other Ambulatory Visit: Payer: Self-pay | Admitting: Physician Assistant

## 2021-11-24 ENCOUNTER — Ambulatory Visit: Payer: 59 | Admitting: Cardiovascular Disease

## 2021-11-25 ENCOUNTER — Ambulatory Visit: Payer: 59 | Admitting: Nurse Practitioner

## 2021-11-29 ENCOUNTER — Encounter
Admission: RE | Admit: 2021-11-29 | Discharge: 2021-11-29 | Disposition: A | Discharge status: Home / Self Care | Payer: 59 | Source: Ambulatory Visit | Attending: Physician Assistant | Admitting: Physician Assistant

## 2021-11-29 ENCOUNTER — Telehealth: Payer: Self-pay | Admitting: *Deleted

## 2021-11-29 DIAGNOSIS — I420 Dilated cardiomyopathy: Secondary | ICD-10-CM | POA: Diagnosis present

## 2021-11-29 LAB — NM MYOCAR MULTI W/SPECT W/WALL MOTION / EF
Base ST Depression (mm): 0 mm
LV dias vol: 63 mL (ref 62–150)
LV sys vol: 25 mL
MPHR: 165 {beats}/min
Nuc Stress EF: 60 %
Peak HR: 141 {beats}/min
Percent HR: 85 %
Rest HR: 83 {beats}/min
Rest Nuclear Isotope Dose: 9.7 mCi
SDS: 0
SRS: 3
SSS: 0
ST Depression (mm): 0 mm
Stress Nuclear Isotope Dose: 30.8 mCi
TID: 1

## 2021-11-29 MED ORDER — TECHNETIUM TC 99M TETROFOSMIN IV KIT
9.6600 | PACK | Freq: Once | INTRAVENOUS | Status: AC | PRN
  Administered 2021-11-29: 9.66 via INTRAVENOUS

## 2021-11-29 MED ORDER — REGADENOSON 0.4 MG/5ML IV SOLN
0.4000 mg | Freq: Once | INTRAVENOUS | Status: AC
  Administered 2021-11-29: 0.4 mg via INTRAVENOUS

## 2021-11-29 MED ORDER — TECHNETIUM TC 99M TETROFOSMIN IV KIT
30.8100 | PACK | Freq: Once | INTRAVENOUS | Status: AC | PRN
  Administered 2021-11-29: 30.81 via INTRAVENOUS

## 2021-11-29 NOTE — Telephone Encounter (Signed)
-----   Message from Sondra Barges, PA-C sent at 11/29/2021  4:39 PM EDT ----- ?Please inform the patient his stress test showed no evidence of ischemia or infarction and was overall low risk with normal pump function.  No significant coronary artery calcification or aortic atherosclerosis on CT imaging.  Also of note was his heart rate was well controlled at rest at 83 bpm. ?

## 2021-11-29 NOTE — Telephone Encounter (Signed)
Left voicemail message to call back for results.  

## 2021-12-01 ENCOUNTER — Ambulatory Visit: Payer: 59 | Admitting: Medical

## 2021-12-01 ENCOUNTER — Encounter: Payer: Self-pay | Admitting: Medical

## 2021-12-01 ENCOUNTER — Other Ambulatory Visit: Payer: Self-pay | Admitting: Medical

## 2021-12-01 VITALS — BP 90/60 | HR 75 | Ht 69.0 in | Wt 224.0 lb

## 2021-12-01 DIAGNOSIS — I4821 Permanent atrial fibrillation: Secondary | ICD-10-CM | POA: Diagnosis not present

## 2021-12-01 DIAGNOSIS — I420 Dilated cardiomyopathy: Secondary | ICD-10-CM | POA: Diagnosis not present

## 2021-12-01 MED ORDER — DIGOXIN 125 MCG PO TABS: 0.1250 mg | ORAL_TABLET | Freq: Every day | ORAL | 3 refills | Status: DC

## 2021-12-01 MED ORDER — PAROXETINE HCL 40 MG PO TABS: 40.0000 mg | ORAL_TABLET | Freq: Every morning | ORAL | 3 refills | Status: DC

## 2021-12-01 MED ORDER — FUROSEMIDE 20 MG PO TABS: 20.0000 mg | ORAL_TABLET | ORAL | 3 refills | Status: DC | PRN

## 2021-12-01 MED ORDER — RIVAROXABAN 20 MG PO TABS: 20.0000 mg | ORAL_TABLET | Freq: Every day | ORAL | 3 refills | Status: DC

## 2021-12-01 MED ORDER — METOPROLOL SUCCINATE ER 25 MG PO TB24: ORAL_TABLET | ORAL | 3 refills | Status: DC

## 2021-12-01 NOTE — Progress Notes (Signed)
?Cardiology Office Note:   ? ?Date:  12/01/2021  ? ?ID:  Stanley Kane, DOB 1966-01-03, MRN [id number] ? ?PCP:  [name], MD  ?[facility] Cardiologist:  Julien Nordmann, MD  ?[facility] Electrophysiologist:  Lanier Prude, MD  ? ?Referring MD: [name], MD  ? ?Chief Complaint: testing follow-up ? ?History of Present Illness:   ? ?Stanley Kane is a 56 y.o. male with a hx of permanent A. fib diagnosed in 2013 status post DCCV in 06/2012 with recurrent A. fib status post repeat DCCV in 10/2014 with subsequent recurrence on Xarelto, dilated cardiomyopathy, mild mitral regurgitation, remote cocaine use, and obesity who presents for follow-up of his cardiomyopathy and A. fib. ?  ?Prior echo from 08/2014 showed an EF of 60-65%, mild MR, mild TR, normal RVSF and PASP.  He was noted to be back in Afib in 2016 in the setting of medication noncompliance. Following resumption of medications, he underwent briefly successful DCCV on 11/25/2014. He was again noted to be back in Afib in 12/2014 and has remained in Afib in subsequent office visits leading to the discontinuation of flecainide and rate control strategy in 09/2016. He underwent calcium scoring in 09/2016 given atypical chest pain with a score of 0.   He underwent nuclear stress testing, in 08/2017, to evaluate for high risk ischemia (with plans to consider rhythm control strategy) that was negative for evidence of ischemia or scar. EF was reduced, felt to be underestimated given Afib. We followed this up with an echo in 10/2017 that showed an EF of 45-50%, no RWMA, mildly dilated left atrium measuring 42 mm, RVSF and PASP were normal.  He was seen in 11/2019 needing annual follow-up for DOT clearance.  He was doing well from a cardiac perspective.  He was noted to not be taking Xarelto due to financial issues.  She underwent Lexiscan MPI in 12/2019 which was a low risk and without evidence of ischemia.  LVEF was mildly decreased at 46%, though  visually appeared to be normal.  Echo was recommended to confirm EF, though remains pending.  He was last seen in the office in 12/2019 and was doing well from a cardiac perspective.  He was noted to be tachycardic with a rate of 125, though reported he was in a hurry and had not yet taken his medications that day.  He was cleared for DOT driving. ? ?He was last seen 05/21/21 and was doing well from a cardiac perspective. He was mildly tachycardic with low BP. He was referred to EP.  ? ?Pt called for DOT Clearance, Myoview Lexiscan was ordered which was low risk, normal LV perfusion with no ischemia, normal LVEF, CT attenuation images with no significant aortic or coronary calcifications.  ? ?Today, the stress test was reviewed in detail. BP is low, he denies dizziness or lightheadedness. BP is chronically low. He is on Toprol. No chest pain , SOB, LLE, orthopnea, pnd. He only takes lasix as needed.  ? ?Past Medical History:  ?Diagnosis Date  ? Fatty liver 06/2014  ? Fatty Liver was a disease that father had  ? Mitral regurgitation   ? a. TTE 2-16: EF 60-65%, mild MR/TR, nl RV sys fxn, nl PASP  ? Obesity   ? OSA (obstructive sleep apnea)   ? Persistent atrial fibrillation ([facility])   ? a. dx 2013 s/p DCCV; b. recurrent Afib 2016 s/p DCCV 11/25/2014; c. CHADS2VASc 0  ? Prediabetes   ? 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  ?  hospitalization [facility] - stress with father's illness  ? ? ?Past Surgical History:  ?Procedure Laterality Date  ? CARDIOVASCULAR STRESS TEST  ~2005  ? thinks at [facility], told normal  ? carotid US  08/2014  ? no plaque  ? COLONOSCOPY  2000  ? WNL, for weight loss after started gym  ? MRI  08/2014  ? brain - WNL, skull base C1/2 articulation  ? TONSILLECTOMY AND ADENOIDECTOMY  childhood  ? US ECHOCARDIOGRAPHY  08/2014  ? EF 60%, nl vent fxn, mildly dilated LA, mild MR, mild TR  ? ? ?Current Medications: ?Current Meds  ?Medication Sig  ? [DISCONTINUED] digoxin (LANOXIN) 0.125 MG tablet Take 1 tablet (0.125 mg total)  by mouth daily.  ? [DISCONTINUED] furosemide (LASIX) 20 MG tablet Take 1 tablet (20 mg total) by mouth as needed for edema.  ? [DISCONTINUED] metoprolol succinate (TOPROL-XL) 25 MG 24 hr tablet TAKE 1 TABLET BY MOUTH DAILY.  ? [DISCONTINUED] PARoxetine (PAXIL) 40 MG tablet TAKE ONE TABLET BY MOUTH EVERY MORNING  ? [DISCONTINUED] rivaroxaban (XARELTO) 20 MG TABS tablet Take 1 tablet (20 mg total) by mouth daily with supper.  ?  ? ?Allergies:   Vicodin [hydrocodone-acetaminophen]  ? ?Social History  ? ?Socioeconomic History  ? Marital status: Divorced  ?  Spouse name: Not on file  ? Number of children: Not on file  ? Years of education: Not on file  ? Highest education level: Not on file  ?Occupational History  ? Not on file  ?Tobacco Use  ? Smoking status: Former  ?  Types: Cigarettes  ?  Quit date: 08/29/1996  ?  Years since quitting: 25.2  ? Smokeless tobacco: Never  ?Vaping Use  ? Vaping Use: Never used  ?Substance and Sexual Activity  ? Alcohol use: Not Currently  ?  Alcohol/week: 2.0 standard drinks  ?  Types: 2 Cans of beer per week  ?  Comment: quit beginning of 2020  ? Drug use: Not Currently  ?  Frequency: 5.0 times per week  ?  Types: Cocaine  ?  Comment: 3-5 grams a day, quit beginning of year 2020   ? Sexual activity: Yes  ?Other Topics Concern  ? Not on file  ?Social History Narrative  ? Occupation: truck Hospital doctor, just started back  ? Edu: 9th grade, GED  ? ?Social Determinants of Health  ? ?Financial Resource Strain: Not on file  ?Food Insecurity: Not on file  ?Transportation Needs: Not on file  ?Physical Activity: Not on file  ?Stress: Not on file  ?Social Connections: Not on file  ?  ? ?Family History: ?The patient's family history includes CAD (age of onset: 43) in his sister; Cirrhosis in his father; Coronary artery disease (age of onset: 21) in his father; Diabetes in his mother; GI Disease in his sister; Heart attack in his father; Heart disease in his mother; Hypercalcemia in his father;  Hypertension in his father. There is no history of Stroke. ? ?ROS:   ?Please see the history of present illness.    ? All other systems reviewed and are negative. ? ?EKGs/Labs/Other Studies Reviewed:   ? ?The following studies were reviewed today: ? ?Lexiscan MPI 11/2019 ?  The study is normal. The study is low risk. ?  No ST deviation was noted. ?  LV perfusion is normal. There is no evidence of ischemia. There is no evidence of infarction. ?  Left ventricular function is normal. End diastolic cavity size is normal. End systolic cavity  size is normal. ?  CT attenuation images with no significant aortic or coronary calcifications. ? ?Lexiscan MPI 12/2019: ?The study is normal. ?This is a low risk study. ?The left ventricular ejection fraction is mildly decreased (46%) on measurment. Although visually, EF appears normal. Recommend comfirming EF with Echo ?There is no evidence for ischemia ?__________ ?  ?2D echo 10/2017: ?- Left ventricle: The cavity size was normal. Systolic function was  ?  mildly reduced. The estimated ejection fraction was in the range  ?  of 45% to 50%. Wall motion was normal; there were no regional  ?  wall motion abnormalities. The study is not technically  ?  sufficient to allow evaluation of LV diastolic function.  ?- Left atrium: The atrium was mildly dilated.  ?- Right ventricle: Systolic function was normal.  ?- Pulmonary arteries: Systolic pressure was within the normal  ?  range.  ? ?Impressions:  ? ?- Atrial fibrillation rate 100 to 120 bpm. ?__________ ?  ?Lexiscan MPI 08/2017: ?Abnormal, intermediate risk myocardial perfusion stress test due to reduced LVEF. ?Normal myocardial perfusion without evidence of ischemia or scar. ?Left ventricular ejection fraction is moderately to severely reduced (33% by QGS, 44% by Siemens calculation), though it appears better by visual estimation. LVEF may be underestimated due to atrial fibrillation. ? ?EKG:  EKG is not ordered today.   ? ?Recent  Labs: ?05/21/2021: BUN 16; Creatinine, Ser 1.07; Hemoglobin 15.2; Platelets 395; Potassium 3.9; Sodium 140  ?Recent Lipid Panel ?   ?Component Value Date/Time  ? CHOL 177 09/08/2015 1036  ? CHOL 157 09/25/2014 1731  ? CHOL 1

## 2021-12-01 NOTE — Patient Instructions (Addendum)
Medication Instructions:  ? ?Your physician recommends that you continue on your current medications as directed. Please refer to the Current Medication list given to you today. ? ?Lab Work: ? ?None ordered ?  ?If you have labs (blood work) drawn today and your tests are completely normal, you will receive your results only by: ?MyChart Message (if you have MyChart) OR ?A paper copy in the mail ?If you have any lab test that is abnormal or we need to change your treatment, we will call you to review the results. ? ? ?Testing/Procedures: ? ?None Ordered ? ?Follow-Up: ?At [facility], you and your health needs are our priority.  As part of our continuing mission to provide you with exceptional heart care, we have created designated Provider Care Teams.  These Care Teams include your primary Cardiologist (physician) and Advanced Practice Providers (APPs -  Physician Assistants and Nurse Practitioners) who all work together to provide you with the care you need, when you need it. ? ?We recommend signing up for the patient portal called "MyChart".  Sign up information is provided on this After Visit Summary.  MyChart is used to connect with patients for Virtual Visits (Telemedicine).  Patients are able to view lab/test results, encounter notes, upcoming appointments, etc.  Non-urgent messages can be sent to your provider as well.   ?To learn more about what you can do with MyChart, go to [email or web address].   ? ?Your next appointment:  ? ?6 month(s) With Gollan ? ?Schedule With Lalla Brothers  ? ? ?

## 2021-12-03 NOTE — Telephone Encounter (Signed)
Left voicemail message to call back for review of results.  

## 2021-12-07 ENCOUNTER — Encounter: Payer: Self-pay | Admitting: *Deleted

## 2021-12-07 NOTE — Telephone Encounter (Signed)
Left voicemail message that I would mail results to him and to call back if any questions.  ?

## 2021-12-07 NOTE — Telephone Encounter (Signed)
Left voicemail message to call back for review of results.  

## 2021-12-20 ENCOUNTER — Telehealth: Payer: Self-pay

## 2021-12-20 NOTE — Telephone Encounter (Signed)
I spoke with pt and confirmed CPX with fasting labs on 12/27/21 at 11:30 with Dr Reece Agar here at [facility] [address] [place] and pt voiced understanding and nothing further needed. ?

## 2021-12-20 NOTE — Telephone Encounter (Signed)
[place] Night - Client ?Nonclinical Telephone Record  ?[facility]? ?Client [place] Night - Client ?Client Site [place] ?Provider Ria Bush - MD ?Contact Type Call ?Who Is Calling Patient / Member / Family / Caregiver ?Caller Name Montarius Gribbins ?Caller Phone Number [phone] ?Patient Name Stanley Kane ?Patient DOB 1966-05-03 ?Call Type Message Only Information Provided ?Reason for Call Request for General Office Information ?Initial Comment Caller is supposed to have an appt on 12/27/21 but he needs to confirm the date and time. ?Additional Comment Caller is aware of office hrs and is requesting a call back. ?Disp. Time Disposition Final User ?12/17/2021 5:18:47 PM General Information Provided Yes Maurice March ?Call Closed By: Maurice March ?Transaction Date/Time: 12/17/2021 5:14:46 PM (ET ?

## 2021-12-27 ENCOUNTER — Ambulatory Visit (INDEPENDENT_AMBULATORY_CARE_PROVIDER_SITE_OTHER): Payer: 59 | Admitting: Family Medicine

## 2021-12-27 ENCOUNTER — Encounter: Payer: Self-pay | Admitting: Family Medicine

## 2021-12-27 VITALS — BP 100/80 | HR 72 | Temp 97.4°F | Ht 67.1 in | Wt 220.5 lb

## 2021-12-27 DIAGNOSIS — M79642 Pain in left hand: Secondary | ICD-10-CM

## 2021-12-27 DIAGNOSIS — L400 Psoriasis vulgaris: Secondary | ICD-10-CM | POA: Insufficient documentation

## 2021-12-27 DIAGNOSIS — Z125 Encounter for screening for malignant neoplasm of prostate: Secondary | ICD-10-CM | POA: Diagnosis not present

## 2021-12-27 DIAGNOSIS — Z0001 Encounter for general adult medical examination with abnormal findings: Secondary | ICD-10-CM | POA: Diagnosis not present

## 2021-12-27 DIAGNOSIS — R7303 Prediabetes: Secondary | ICD-10-CM

## 2021-12-27 DIAGNOSIS — D171 Benign lipomatous neoplasm of skin and subcutaneous tissue of trunk: Secondary | ICD-10-CM

## 2021-12-27 DIAGNOSIS — Z23 Encounter for immunization: Secondary | ICD-10-CM

## 2021-12-27 DIAGNOSIS — F411 Generalized anxiety disorder: Secondary | ICD-10-CM

## 2021-12-27 DIAGNOSIS — K760 Fatty (change of) liver, not elsewhere classified: Secondary | ICD-10-CM

## 2021-12-27 DIAGNOSIS — Z1211 Encounter for screening for malignant neoplasm of colon: Secondary | ICD-10-CM | POA: Diagnosis not present

## 2021-12-27 DIAGNOSIS — E559 Vitamin D deficiency, unspecified: Secondary | ICD-10-CM | POA: Diagnosis not present

## 2021-12-27 DIAGNOSIS — M7989 Other specified soft tissue disorders: Secondary | ICD-10-CM

## 2021-12-27 DIAGNOSIS — E669 Obesity, unspecified: Secondary | ICD-10-CM

## 2021-12-27 DIAGNOSIS — M79641 Pain in right hand: Secondary | ICD-10-CM

## 2021-12-27 DIAGNOSIS — I420 Dilated cardiomyopathy: Secondary | ICD-10-CM

## 2021-12-27 DIAGNOSIS — I4821 Permanent atrial fibrillation: Secondary | ICD-10-CM

## 2021-12-27 DIAGNOSIS — R21 Rash and other nonspecific skin eruption: Secondary | ICD-10-CM

## 2021-12-27 HISTORY — DX: Benign lipomatous neoplasm of skin and subcutaneous tissue of trunk: D17.1

## 2021-12-27 LAB — CBC WITH DIFFERENTIAL/PLATELET
Basophils Absolute: 0.1 10*3/uL (ref 0.0–0.1)
Basophils Relative: 1.2 % (ref 0.0–3.0)
Eosinophils Absolute: 0.3 10*3/uL (ref 0.0–0.7)
Eosinophils Relative: 3.7 % (ref 0.0–5.0)
HCT: 48.4 % (ref 39.0–52.0)
Hemoglobin: 16.4 g/dL (ref 13.0–17.0)
Lymphocytes Relative: 17.6 % (ref 12.0–46.0)
Lymphs Abs: 1.6 10*3/uL (ref 0.7–4.0)
MCHC: 33.9 g/dL (ref 30.0–36.0)
MCV: 97.2 fl (ref 78.0–100.0)
Monocytes Absolute: 0.7 10*3/uL (ref 0.1–1.0)
Monocytes Relative: 8 % (ref 3.0–12.0)
Neutro Abs: 6.2 10*3/uL (ref 1.4–7.7)
Neutrophils Relative %: 69.5 % (ref 43.0–77.0)
Platelets: 354 10*3/uL (ref 150.0–400.0)
RBC: 4.97 Mil/uL (ref 4.22–5.81)
RDW: 13.4 % (ref 11.5–15.5)
WBC: 8.9 10*3/uL (ref 4.0–10.5)

## 2021-12-27 LAB — COMPREHENSIVE METABOLIC PANEL
ALT: 47 U/L (ref 0–53)
AST: 32 U/L (ref 0–37)
Albumin: 4.5 g/dL (ref 3.5–5.2)
Alkaline Phosphatase: 47 U/L (ref 39–117)
BUN: 19 mg/dL (ref 6–23)
CO2: 29 mEq/L (ref 19–32)
Calcium: 9.7 mg/dL (ref 8.4–10.5)
Chloride: 97 mEq/L (ref 96–112)
Creatinine, Ser: 1.11 mg/dL (ref 0.40–1.50)
GFR: 74.91 mL/min (ref >60.00)
Glucose, Bld: 95 mg/dL (ref 70–99)
Potassium: 4.8 mEq/L (ref 3.5–5.1)
Sodium: 137 mEq/L (ref 135–145)
Total Bilirubin: 0.9 mg/dL (ref 0.2–1.2)
Total Protein: 8.1 g/dL (ref 6.0–8.3)

## 2021-12-27 LAB — TSH: TSH: 2.45 u[IU]/mL (ref 0.35–5.50)

## 2021-12-27 LAB — HEMOGLOBIN A1C: Hgb A1c MFr Bld: 6 % (ref 4.6–6.5)

## 2021-12-27 LAB — LDL CHOLESTEROL, DIRECT: Direct LDL: 149 mg/dL

## 2021-12-27 LAB — VITAMIN D 25 HYDROXY (VIT D DEFICIENCY, FRACTURES): VITD: 24.04 ng/mL — ABNORMAL LOW (ref 30.00–100.00)

## 2021-12-27 LAB — LIPID PANEL
Cholesterol: 222 mg/dL — ABNORMAL HIGH (ref 0–200)
HDL: 43.7 mg/dL (ref >39.00)
NonHDL: 178.16
Total CHOL/HDL Ratio: 5
Triglycerides: 206 mg/dL — ABNORMAL HIGH (ref 0.0–149.0)
VLDL: 41.2 mg/dL — ABNORMAL HIGH (ref 0.0–40.0)

## 2021-12-27 LAB — PSA: PSA: 0.58 ng/mL (ref 0.10–4.00)

## 2021-12-27 MED ORDER — MULTIVITAMIN ADULT PO TABS
1.0000 | ORAL_TABLET | Freq: Every day | ORAL | Status: DC
Start: 2021-12-27 — End: 2023-03-15

## 2021-12-27 MED ORDER — TRIAMCINOLONE ACETONIDE 0.1 % EX CREA: 1.0000 "application " | TOPICAL_CREAM | Freq: Two times a day (BID) | CUTANEOUS | 0 refills | Status: DC

## 2021-12-27 NOTE — Assessment & Plan Note (Signed)
Encouraged ongoing efforts to achieve sustainable weight loss.  ?

## 2021-12-27 NOTE — Assessment & Plan Note (Signed)
Preventative protocols reviewed and updated unless pt declined. Discussed healthy diet and lifestyle.  

## 2021-12-27 NOTE — Assessment & Plan Note (Signed)
Stable period on paxil 40mg  daily - this was last filled by cardiology  ?

## 2021-12-27 NOTE — Progress Notes (Addendum)
? ? Patient ID: Stanley Kane, male    DOB: 05-02-1966, 56 y.o.   MRN: [id number] ? ?This visit was conducted in person. ? ?BP 100/80   Pulse 72   Temp (!) 97.4 ?F (36.3 ?C) (Temporal)   Ht 5' 7.1" (1.704 m)   Wt 220 lb 8 oz (100 kg)   SpO2 95%   BMI 34.43 kg/m?   ? ?CC: CPE ?Subjective:  ? ?HPI: ?Stanley Kane is a 56 y.o. male presenting on 12/27/2021 for Annual Exam, Skin Problem (On legs and arms x 2 months. Thought he was bit by a bug. ), and Mass (R flank area x "a while" ) ? ? 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  ? ?Notes enlarging mass to R upper flank - not painful.  ? ?Perm afib with dilated CM - stable eval last month by cardiology. Planning to establish with EP, on xarelto 20mg  daily and Toprol.  ? ?Last seen 05/2020 virtually.  ?3 mo h/o plaques throughout skin of LE, bilateral elbows affected for the past year. Also has scaly erythematous rash to nasal bridge and medial cheeks, as well as behind ears. Has been using OTC cortizone-10 cream with limited benefit. No nail involvement but joints of fingers do hurt, predominantly PIPs.  ? ?Preventative: ?COLONOSCOPY Date: 2000 WNL. iFOB 2020. Requests rpt.  ?Prostate cancer screening - check PSA today. No BPH symptoms.  ?Lung cancer screening -not eligible  ?Flu shot - yearly, none recently  ?Tdap 03/2012 ?COVID vaccine - none ?Shingrix - discussed.  ?Seat belt use discussed ?Sunscreen use discussed. No changing moles on skin. ?Non smoker ?Alcohol - quit 2 yrs ago.  ?Rec drugs - none ?Dentist yearly - due ?Eye exam yearly - due ?  ?Caffeine: 1 cup coffee ?Lives with GF 04/2012), 1 dog ?Occupation: truck [employer] ?Edu: 9th grade, GED ?Activity: bought treadmill - planning on walking 3x/wk ?Diet: good water, fruits/vegetables daily, some fast foods ?   ? ?Relevant past medical, surgical, family and social history reviewed and updated as indicated. Interim medical history since our last visit reviewed. ?Allergies and medications reviewed  and updated. ?Outpatient Medications Prior to Visit  ?Medication Sig Dispense Refill  ? digoxin (LANOXIN) 0.125 MG tablet Take 1 tablet (0.125 mg total) by mouth daily. 90 tablet 3  ? furosemide (LASIX) 20 MG tablet Take 1 tablet (20 mg total) by mouth as needed for edema. 90 tablet 3  ? metoprolol succinate (TOPROL-XL) 25 MG 24 hr tablet TAKE 1 TABLET BY MOUTH DAILY. 90 tablet 3  ? PARoxetine (PAXIL) 40 MG tablet Take 1 tablet (40 mg total) by mouth every morning. 90 tablet 3  ? rivaroxaban (XARELTO) 20 MG TABS tablet Take 1 tablet (20 mg total) by mouth daily with supper. 90 tablet 3  ? ?No facility-administered medications prior to visit.  ?  ? ?Per HPI unless specifically indicated in ROS section below ?Review of Systems  ?Constitutional:  Negative for activity change, appetite change, chills, fatigue, fever and unexpected weight change.  ?HENT:  Negative for hearing loss.   ?Eyes:  Negative for visual disturbance.  ?Respiratory:  Negative for cough, chest tightness, shortness of breath and wheezing.   ?Cardiovascular:  Negative for chest pain, palpitations and leg swelling.  ?Gastrointestinal:  Positive for nausea and vomiting (lactose). Negative for abdominal distention, abdominal pain, blood in stool, constipation and diarrhea.  ?     Possible lactose intolerance  ?Genitourinary:  Negative for difficulty  urinating and hematuria.  ?Musculoskeletal:  Negative for arthralgias, myalgias and neck pain.  ?Skin:  Negative for rash.  ?Neurological:  Negative for dizziness, seizures, syncope and headaches.  ?Hematological:  Negative for adenopathy. Does not bruise/bleed easily.  ?Psychiatric/Behavioral:  Negative for dysphoric mood. The patient is not nervous/anxious.   ? ?Objective:  ?BP 100/80   Pulse 72   Temp (!) 97.4 ?F (36.3 ?C) (Temporal)   Ht 5' 7.1" (1.704 m)   Wt 220 lb 8 oz (100 kg)   SpO2 95%   BMI 34.43 kg/m?   ?Wt Readings from Last 3 Encounters:  ?12/27/21 220 lb 8 oz (100 kg)  ?12/01/21 224 lb  (101.6 kg)  ?05/21/21 225 lb (102.1 kg)  ?  ?  ?Physical Exam ?Vitals and nursing note reviewed.  ?Constitutional:   ?   General: He is not in acute distress. ?   Appearance: Normal appearance. He is well-developed. He is not ill-appearing.  ?HENT:  ?   Head: Normocephalic and atraumatic.  ?   Right Ear: Hearing, tympanic membrane, ear canal and external ear normal.  ?   Left Ear: Hearing, tympanic membrane, ear canal and external ear normal.  ?Eyes:  ?   General: No scleral icterus. ?   Extraocular Movements: Extraocular movements intact.  ?   Conjunctiva/sclera: Conjunctivae normal.  ?   Pupils: Pupils are equal, round, and reactive to light.  ?Neck:  ?   Thyroid: No thyroid mass or thyromegaly.  ?Cardiovascular:  ?   Rate and Rhythm: Normal rate. Rhythm irregularly irregular.  ?   Pulses: Normal pulses.     ?     Radial pulses are 2+ on the right side and 2+ on the left side.  ?   Heart sounds: Normal heart sounds. No murmur heard. ?Pulmonary:  ?   Effort: Pulmonary effort is normal. No respiratory distress.  ?   Breath sounds: Normal breath sounds. No wheezing, rhonchi or rales.  ?Abdominal:  ?   General: Bowel sounds are normal. There is no distension.  ?   Palpations: Abdomen is soft. There is no mass.  ?   Tenderness: There is no abdominal tenderness. There is no guarding or rebound.  ?   Hernia: No hernia is present.  ?Musculoskeletal:     ?   General: Normal range of motion.  ?   Cervical back: Normal range of motion and neck supple.  ?   Right lower leg: No edema.  ?   Left lower leg: No edema.  ?Lymphadenopathy:  ?   Cervical: No cervical adenopathy.  ?Skin: ?   General: Skin is warm and dry.  ?   Findings: Rash present. Rash is scaling.  ? ?    ?   Comments:  ?Large mobile rubbery mass to R lateral chest  ?Small silver scaling plaques diffusely spread throughout trunk back and lower extremities ?Erythematous scaling rash to nasal bridge and nasal folds as well as to ears and behind ears  ?Neurological:   ?   General: No focal deficit present.  ?   Mental Status: He is alert and oriented to person, place, and time.  ?Psychiatric:     ?   Mood and Affect: Mood normal.     ?   Behavior: Behavior normal.     ?   Thought Content: Thought content normal.     ?   Judgment: Judgment normal.  ? ?   ? ?  12/27/2021  ? 12:29  PM 06/10/2020  ?  8:52 AM 08/14/2017  ?  8:37 AM 08/14/2017  ?  8:28 AM  ?Depression screen PHQ 2/9  ?Decreased Interest 2 0 0 0  ?Down, Depressed, Hopeless 1 0 0 0  ?PHQ - 2 Score 3 0 0 0  ?Altered sleeping 0 0 0   ?Tired, decreased energy 2 0 1   ?Change in appetite 0 0 0   ?Feeling bad or failure about yourself  0 0    ?Trouble concentrating 0 0 0   ?Moving slowly or fidgety/restless 0 0 0   ?Suicidal thoughts 0 0 0   ?PHQ-9 Score 5 0 1   ?Difficult doing work/chores Not difficult at all  Somewhat difficult   ?  ? ?  12/27/2021  ? 12:29 PM 06/10/2020  ?  8:18 AM 08/14/2017  ?  8:36 AM  ?GAD 7 : Generalized Anxiety Score  ?Nervous, Anxious, on Edge 0 0 0  ?Control/stop worrying 0 0 0  ?Worry too much - different things 0 0 2  ?Trouble relaxing 0 0 0  ?Restless 0 0 0  ?Easily annoyed or irritable 1 0 1  ?Afraid - awful might happen 0 0 0  ?Total GAD 7 Score 1 0 3  ?Anxiety Difficulty Not difficult at all    ? ?Assessment & Plan:  ? ?Problem List Items Addressed This Visit   ? ? Encounter for general adult medical examination with abnormal findings - Primary (Chronic)  ?  Preventative protocols reviewed and updated unless pt declined. ?Discussed healthy diet and lifestyle.  ? ?  ?  ? Permanent atrial fibrillation (HCC)  ?  Continue cards f/u on xarelto, metoprolol and digoxin - planning to establish with EP to discuss other treatment options.  ? ?  ?  ? Relevant Orders  ? CBC with Differential/Platelet  ? TSH  ? GAD (generalized anxiety disorder)  ?  Stable period on paxil 40mg  daily - this was last filled by cardiology  ? ?  ?  ? Obesity, Class I, BMI 30-34.9  ?  Encouraged ongoing efforts to achieve  sustainable weight loss.  ? ?  ?  ? Prediabetes  ?  Update labs. Encouraged avoiding added sugars in diet.  ? ?  ?  ? Relevant Orders  ? Lipid panel  ? Comprehensive metabolic panel  ? Hemoglobin A1c  ? Hand pain  ?  Arth

## 2021-12-27 NOTE — Patient Instructions (Addendum)
First shingrix today. Return in 2-6 months for nurse visit for 2nd shingrix shot  ?Labs today. ?Pass by lab to pick up stool kit.  ?For likely psoriasis - may use stronger steroid cream sent to pharmacy.  ?We will also refer you to skin doctor and rheumatologist for evaluation of hand pain.  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  ?Return in 6 month for follow up visit.  ? ?Health Maintenance, Male ?Adopting a healthy lifestyle and getting preventive care are important in promoting health and wellness. Ask your health care provider about: ?The right schedule for you to have regular tests and exams. ?Things you can do on your own to prevent diseases and keep yourself healthy. ?What should I know about diet, weight, and exercise? ?Eat a healthy diet ? ?Eat a diet that includes plenty of vegetables, fruits, low-fat dairy products, and lean protein. ?Do not eat a lot of foods that are high in solid fats, added sugars, or sodium. ?Maintain a healthy weight ?Body mass index (BMI) is a measurement that can be used to identify possible weight problems. It estimates body fat based on height and weight. Your health care provider can help determine your BMI and help you achieve or maintain a healthy weight. ?Get regular exercise ?Get regular exercise. This is one of the most important things you can do for your health. Most adults should: ?Exercise for at least 150 minutes each week. The exercise should increase your heart rate and make you sweat (moderate-intensity exercise). ?Do strengthening exercises at least twice a week. This is in addition to the moderate-intensity exercise. ?Spend less time sitting. Even light physical activity can be beneficial. ?Watch cholesterol and blood lipids ?Have your blood tested for lipids and cholesterol at 55 years of age, then have this test every 5 years. ?You may need to have your cholesterol levels checked more often if: ?Your  lipid or cholesterol levels are high. ?You are older than 55 years of age. ?You are at high risk for heart disease. ?What should I know about cancer screening? ?Many types of cancers can be detected early and may often be prevented. Depending on your health history and family history, you may need to have cancer screening at various ages. This may include screening for: ?Colorectal cancer. ?Prostate cancer. ?Skin cancer. ?Lung cancer. ?What should I know about heart disease, diabetes, and high blood pressure? ?Blood pressure and heart disease ?High blood pressure causes heart disease and increases the risk of stroke. This is more likely to develop in people who have high blood pressure readings or are overweight. ?Talk with your health care provider about your target blood pressure readings. ?Have your blood pressure checked: ?Every 3-5 years if you are 27-41 years of age. ?Every year if you are 65 years old or older. ?If you are between the ages of 49 and 16 and are a current or former smoker, ask your health care provider if you should have a one-time screening for abdominal aortic aneurysm (AAA). ?Diabetes ?Have regular diabetes screenings. This checks your fasting blood sugar level. Have the screening done: ?Once every three years after age 91 if you are at a normal weight and have a low risk for diabetes. ?More often and at a younger age if you are overweight or have a high risk for diabetes. ?What should I know about preventing infection? ?Hepatitis B ?If you have a higher risk for hepatitis B, you should be  screened for this virus. Talk with your health care provider to find out if you are at risk for hepatitis B infection. ?Hepatitis C ?Blood testing is recommended for: ?Everyone born from [birth year] through 1964. ?Anyone with known risk factors for hepatitis C. ?Sexually transmitted infections (STIs) ?You should be screened each year for STIs, including gonorrhea and chlamydia, if: ?You are sexually active and  are younger than 55 years of age. ?You are older than 55 years of age and your health care provider tells you that you are at risk for this type of infection. ?Your sexual activity has changed since you were last screened, and you are at increased risk for chlamydia or gonorrhea. Ask your health care provider if you are at risk. ?Ask your health care provider about whether you are at high risk for HIV. Your health care provider may recommend a prescription medicine to help prevent HIV infection. If you choose to take medicine to prevent HIV, you should first get tested for HIV. You should then be tested every 3 months for as long as you are taking the medicine. ?Follow these instructions at home: ?Alcohol use ?Do not drink alcohol if your health care provider tells you not to drink. ?If you drink alcohol: ?Limit how much you have to 0-2 drinks a day. ?Know how much alcohol is in your drink. In the [place] one drink equals one 12 oz bottle of beer (355 mL), one 5 oz glass of wine (148 mL), or one 1? oz glass of hard liquor (44 mL). ?Lifestyle ?Do not use any products that contain nicotine or tobacco. These products include cigarettes, chewing tobacco, and vaping devices, such as e-cigarettes. If you need help quitting, ask your health care provider. ?Do not use street drugs. ?Do not share needles. ?Ask your health care provider for help if you need support or information about quitting drugs. ?General instructions ?Schedule regular health, dental, and eye exams. ?Stay current with your vaccines. ?Tell your health care provider if: ?You often feel depressed. ?You have ever been abused or do not feel safe at home. ?Summary ?Adopting a healthy lifestyle and getting preventive care are important in promoting health and wellness. ?Follow your health care provider's instructions about healthy diet, exercising, and getting tested or screened for diseases. ?Follow your health care provider's instructions on monitoring your  cholesterol and blood pressure. ?This information is not intended to replace advice given to you by your health care provider. Make sure you discuss any questions you have with your health care provider. ?Document Revised: 01/04/2021 Document Reviewed: 01/04/2021 ?Elsevier Patient Education ? [employer]. ? ?

## 2021-12-27 NOTE — Assessment & Plan Note (Signed)
Arthralgias to PIPs with some locking.  ?In new presumed psoriasis rash, concern for psoriatic arthritis - will refer to rheum as well.  ?

## 2021-12-27 NOTE — Assessment & Plan Note (Signed)
Most consistent with psoriasis.  ?Rx triamcinolone cream while he gets in to see dermatology - referral placed.  ?

## 2021-12-27 NOTE — Assessment & Plan Note (Signed)
Large mass anticipate lipoma. As enlarging, will check [place]. Pt may consider gen surg eval for excision given enlarging and bothersome.  ?

## 2021-12-27 NOTE — Assessment & Plan Note (Signed)
Update labs. Encouraged avoiding added sugars in diet.  ?

## 2021-12-27 NOTE — Assessment & Plan Note (Signed)
Continue cards f/u on xarelto, metoprolol and digoxin - planning to establish with EP to discuss other treatment options.  ?

## 2021-12-27 NOTE — Assessment & Plan Note (Addendum)
H/o this on prior imaging.  ?Consider Hep A/B vaccination.  ?

## 2021-12-27 NOTE — Assessment & Plan Note (Signed)
Followed by cardiology 

## 2022-01-03 ENCOUNTER — Encounter: Payer: Self-pay | Admitting: Family Medicine

## 2022-01-03 DIAGNOSIS — E785 Hyperlipidemia, unspecified: Secondary | ICD-10-CM | POA: Insufficient documentation

## 2022-01-03 DIAGNOSIS — E1169 Type 2 diabetes mellitus with other specified complication: Secondary | ICD-10-CM | POA: Insufficient documentation

## 2022-01-05 ENCOUNTER — Institutional Professional Consult (permissible substitution): Payer: 59 | Admitting: Cardiology

## 2022-01-13 ENCOUNTER — Encounter: Payer: Self-pay | Admitting: *Deleted

## 2022-01-25 ENCOUNTER — Telehealth: Payer: Self-pay | Admitting: Cardiovascular Disease

## 2022-01-25 NOTE — Telephone Encounter (Signed)
Clinical pharmacist to review Xarelto 

## 2022-01-25 NOTE — Telephone Encounter (Signed)
   Pre-operative Risk Assessment    Patient Name: Stanley Kane  DOB: Nov 16, 1965 MRN: [id number]     Request for Surgical Clearance    Procedure:  Dental Extraction - Amount of Teeth to be Pulled:  5  Date of Surgery:  pending   Clearance                                  Surgeon:  Dr Tommy Rainwater Lee,DDS Surgeon's Group or Practice Name:   Phone number:  [phone] Fax number:  [phone]  Type of Clearance Requested:   - Medical  - Pharmacy:  Hold Rivaroxaban (Xarelto)     Type of Anesthesia:  None    Additional requests/questions:    [name]   01/25/2022, 1:48 PM

## 2022-01-25 NOTE — Telephone Encounter (Signed)
Patient with diagnosis of afib on Xarelto for anticoagulation.    Procedure: 5 dental extractions Date of procedure: TBD  CHA2DS2-VASc Score = 1  This indicates a 0.6% annual risk of stroke. The patient's score is based upon: CHF History: 1 HTN History: 0 Diabetes History: 0 Stroke History: 0 Vascular Disease History: 0 Age Score: 0 Gender Score: 0      CrCl 90mL/min using adjusted body weight due to obesity Platelet count 354K  Patient does not require pre-op antibiotics for dental procedure.  Per office protocol, patient can hold Xarelto for 1-2 days prior to procedure.

## 2022-01-27 NOTE — Telephone Encounter (Signed)
    Patient Name: Stanley Kane  DOB: 1965/10/31 MRN: [id number]  Primary Cardiologist: Julien Nordmann, MD  Chart reviewed as part of pre-operative protocol coverage. Given past medical history and time since last visit, based on ACC/AHA guidelines, Stanley Kane would be at acceptable risk for the planned procedure without further cardiovascular testing.  Recent nuclear stress test was low risk.  Patient may hold his Xarelto for 1 to 2 days prior to the procedure and restart as once possible afterward at the surgeon's discretion.  He does not need SBE prophylaxis given no prior history of valve surgery, congenital heart issue or endocarditis.  I will route this recommendation to the requesting party via Epic fax function and remove from pre-op pool.  Please call with questions.  [place], [name] 01/27/2022, 1:12 PM

## 2022-01-27 NOTE — Telephone Encounter (Signed)
I s/w Becky with Dr. Marguerita Beards office. I read notes to [facility] and went over the recommendations for Xarelto. I did tell Jacqlyn Larsen that we tried to reach the pt x 2 to go over instructions, though pt has not returned the calls. I will fax these notes to DDS office.

## 2022-01-27 NOTE — Telephone Encounter (Signed)
Becky with [facility] calling back regarding patient's clearance. Specifically, she would like to know if the patient is able to hold his blood thinner as requested. Please advise.  Phone#: [phone] They are in the office until 3:00 PM today and will not return until Monday, 6/05.

## 2022-02-14 ENCOUNTER — Telehealth: Payer: Self-pay | Admitting: Family Medicine

## 2022-02-14 NOTE — Telephone Encounter (Signed)
Pt is requesting call back about lab results from May , [phone]

## 2022-02-15 NOTE — Telephone Encounter (Addendum)
Spoke with pt relaying results (12/27/21) and and Dr. Timoteo Expose message. I also informed pt all info was mailed to him.  Pt verbalizes understanding.

## 2022-03-14 ENCOUNTER — Other Ambulatory Visit: Payer: Self-pay | Admitting: Family Medicine

## 2022-03-14 NOTE — Telephone Encounter (Signed)
Patient called to get the number to Dermatologist that Dr. Reece Agar referred him to, he would like to get the appointment earlier. Call back [phone].

## 2022-03-14 NOTE — Telephone Encounter (Signed)
Spoke with pt providing him phn # [phone] to [facility].   Also, pt requests refill for triamcinolone cream.   Last rx:  12/27/21, #453.6 g Last OV:  12/27/21, CPE Next OV:  06/28/22, 6 mo f/u

## 2022-03-15 MED ORDER — TRIAMCINOLONE ACETONIDE 0.1 % EX CREA: 1.0000 | TOPICAL_CREAM | Freq: Two times a day (BID) | CUTANEOUS | 0 refills | Status: DC

## 2022-03-16 ENCOUNTER — Institutional Professional Consult (permissible substitution): Payer: 59 | Admitting: Cardiology

## 2022-05-11 ENCOUNTER — Ambulatory Visit: Payer: 59 | Attending: Cardiology | Admitting: Cardiology

## 2022-05-11 NOTE — Progress Notes (Deleted)
Electrophysiology Office Note:    Date:  05/11/2022   ID:  Stanley Kane, DOB [birth year], MRN [id number]  PCP:  Stanley Boyden, MD  [facility] Cardiologist:  Stanley Nordmann, MD  [facility] Electrophysiologist:  Stanley Prude, MD   Referring MD: Stanley Barges, PA-C   Chief Complaint: Atrial fibrillation  History of Present Illness:    Stanley Kane is a 56 y.o. male who presents for an evaluation of atrial fibrillation at the request of Stanley Kane. Their medical history includes permanent atrial fibrillation first diagnosed in 2013 on Xarelto for stroke prophylaxis, dilated cardiomyopathy, mild mitral regurgitation, remote drug abuse and obesity.  Rhythm control efforts were abandoned in 2018 given recurrent arrhythmias.  He last saw Stanley Kane December 01, 2021 for DOT clearance.  He was felt safe to drive given a low risk stress test.  He remained in atrial fibrillation and was taking his Eliquis for stroke prophylaxis.     Past Medical History:  Diagnosis Date   Fatty liver 06/2014   Fatty Liver was a disease that father had   Mitral regurgitation    a. TTE 2-16: EF 60-65%, mild MR/TR, nl RV sys fxn, nl PASP   Obesity    OSA (obstructive sleep apnea)    Persistent atrial fibrillation (HCC)    a. dx 2013 s/p DCCV; b. recurrent Afib 2016 s/p DCCV 11/25/2014; c. 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   hospitalization [facility] - stress with father's illness    Past Surgical History:  Procedure Laterality Date   CARDIOVASCULAR STRESS TEST  ~2005   thinks at [facility], told normal   carotid US  08/2014   no plaque   COLONOSCOPY  2000   WNL, for weight loss after started gym   MRI  08/2014   brain - WNL, skull base C1/2 articulation   TONSILLECTOMY AND ADENOIDECTOMY  childhood   US ECHOCARDIOGRAPHY  08/2014   EF 60%, nl vent fxn, mildly dilated LA, mild MR, mild TR    Current Medications: No outpatient medications have been marked as taking  for the 05/11/22 encounter (Appointment) with Stanley Prude, MD.     Allergies:   Vicodin [hydrocodone-acetaminophen]   Social History   Socioeconomic History   Marital status: Divorced    Spouse name: Not on file   Number of children: Not on file   Years of education: Not on file   Highest education level: Not on file  Occupational History   Not on file  Tobacco Use   Smoking status: Former    Types: Cigarettes    Quit date: 08/29/1996    Years since quitting: 25.7   Smokeless tobacco: Never  Vaping Use   Vaping Use: Never used  Substance and Sexual Activity   Alcohol use: Not Currently    Alcohol/week: 2.0 standard drinks of alcohol    Types: 2 Cans of beer per week    Comment: quit beginning of 2020   Drug use: Not Currently    Frequency: 5.0 times per week    Types: Cocaine    Comment: 3-5 grams a day, quit beginning of year 2020    Sexual activity: Yes  Other Topics Concern   Not on file  Social History Narrative   Occupation: truck driver, just started back   Edu: 9th grade, GED   Social Determinants of Corporate investment banker Strain: Not on file  Food Insecurity: Not on file  Transportation Needs: Not on file  Physical Activity: Inactive (04/18/2019)   Exercise Vital Sign    Days of Exercise per Week: 0 days    Minutes of Exercise per Session: 0 min  Stress: Not on file  Social Connections: Not on file     Family History: The patient's family history includes CAD (age of onset: 81) in his sister; Cirrhosis in his father; Coronary artery disease (age of onset: 43) in his father; Diabetes in his mother; GI Disease in his sister; Heart attack in his father; Heart disease in his mother; Hypercalcemia in his father; Hypertension in his father. There is no history of Stroke.  ROS:   Please see the history of present illness.    All other systems reviewed and are negative.  EKGs/Labs/Other Studies Reviewed:    The following studies were reviewed  today:  November 29, 2021 SPECT Low risk study  November 24, 2017 echo EF 45-50 Dilated atrium No significant valvular abnormalities  May 21, 2021 EKG shows atrial fibrillation with a rapid ventricular rate of 119 bpm.  EKG:  The ekg ordered today demonstrates ***   Recent Labs: 12/27/2021: ALT 47; BUN 19; Creatinine, Ser 1.11; Hemoglobin 16.4; Platelets 354.0; Potassium 4.8; Sodium 137; TSH 2.45  Recent Lipid Panel    Component Value Date/Time   CHOL 222 (H) 12/27/2021 1218   CHOL 157 09/25/2014 1731   CHOL 175 10/10/2011 0000   TRIG 206.0 (H) 12/27/2021 1218   TRIG 250 (H) 09/25/2014 1731   TRIG 93 10/10/2011 0000   HDL 43.70 12/27/2021 1218   HDL 33 (L) 09/25/2014 1731   CHOLHDL 5 12/27/2021 1218   VLDL 41.2 (H) 12/27/2021 1218   VLDL 50 (H) 09/25/2014 1731   LDLCALC 103 (H) 09/08/2015 1036   LDLCALC 74 09/25/2014 1731   LDLDIRECT 149.0 12/27/2021 1218    Physical Exam:    VS:  There were no vitals taken for this visit.    Wt Readings from Last 3 Encounters:  12/27/21 220 lb 8 oz (100 kg)  12/01/21 224 lb (101.6 kg)  05/21/21 225 lb (102.1 kg)     GEN: *** Well nourished, well developed in no acute distress HEENT: Normal NECK: No JVD; No carotid bruits LYMPHATICS: No lymphadenopathy CARDIAC: Irregularly irregular with, no murmurs, rubs, gallops RESPIRATORY:  Clear to auscultation without rales, wheezing or rhonchi  ABDOMEN: Soft, non-tender, non-distended MUSCULOSKELETAL:  No edema; No deformity  SKIN: Warm and dry NEUROLOGIC:  Alert and oriented x 3 PSYCHIATRIC:  Normal affect       ASSESSMENT:    No diagnosis found. PLAN:    In order of problems listed above:  #Permanent atrial fibrillation  #Chronic systolic heart failure NYHA class II.  Warm and dry on exam.  Continue metoprolol and digoxin.   Follow-up with EP on an as-needed basis.   Total time spent with patient today *** minutes. This includes reviewing records, evaluating the  patient and coordinating care.  Medication Adjustments/Labs and Tests Ordered: Current medicines are reviewed at length with the patient today.  Concerns regarding medicines are outlined above.  No orders of the defined types were placed in this encounter.  No orders of the defined types were placed in this encounter.    Signed, Rossie Muskrat. Lalla Brothers, MD, [facility], [facility] 05/11/2022 5:12 AM    Electrophysiology [facility]

## 2022-05-18 ENCOUNTER — Other Ambulatory Visit: Payer: Self-pay | Admitting: Family Medicine

## 2022-05-18 NOTE — Telephone Encounter (Signed)
Refill request kenalog Last office 12/27/21 Upcoming appointment 06/28/22 Last refill 03/15/22  543.6 G

## 2022-05-30 ENCOUNTER — Telehealth: Payer: Self-pay | Admitting: Family Medicine

## 2022-05-30 NOTE — Telephone Encounter (Signed)
Kenalog cream Last rx:  05/23/22, #454 g Last OV:  12/27/21, CPE Next OV:  06/28/22, 6 mo f/u

## 2022-05-30 NOTE — Telephone Encounter (Signed)
  Encourage patient to contact the pharmacy for refills or they can request refills through [facility]  Did the patient contact the pharmacy: No  LAST APPOINTMENT DATE: 12/27/2021  NEXT APPOINTMENT DATE: 06/28/2022  MEDICATION: triamcinolone cream (KENALOG) 0.1 %  Is the patient out of medication? Yes  PHARMACY: [place], [place]  Comments: Patient will be in the area for a few hours and they have one jar left. He will be traveling over the road the next few weeks.   Let patient know to contact pharmacy at the end of the day to make sure medication is ready.  Please notify patient to allow 48-72 hours to process

## 2022-05-31 NOTE — Telephone Encounter (Signed)
Patient notified as instructed by telephone. 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 Patient stated that there is a [facility] in his area. Patient was advised that he can reach out to the [facility] and see if they will contact his local pharmacy and get them to transfer the prescription for him. Patient stated that he will call [facility] in [name] and see if they can get the script transferred for him. Patient was advised to call back if he has any problems getting the medication transferred.

## 2022-05-31 NOTE — Telephone Encounter (Signed)
Plz notify pt this was sent in 9/25.

## 2022-06-28 ENCOUNTER — Ambulatory Visit: Payer: 59 | Admitting: Family Medicine

## 2022-06-29 ENCOUNTER — Ambulatory Visit: Payer: 59 | Admitting: Dermatology

## 2022-06-29 ENCOUNTER — Ambulatory Visit: Payer: 59 | Attending: Cardiology | Admitting: Cardiology

## 2022-06-29 NOTE — Progress Notes (Deleted)
Electrophysiology Office Note:    Date:  06/29/2022   ID:  Stanley Kane, DOB 07/05/1966, MRN [id number]  PCP:  [name], MD  [facility] Cardiologist:  Julien Nordmann, MD  [facility] Electrophysiologist:  Lanier Prude, MD   Referring MD: Sondra Barges, PA-C   Chief Complaint: Atrial fibrillation  History of Present Illness:    Stanley Kane is a 56 y.o. male who presents for an evaluation of atrial fibrillation at the request of [name], PA-C. Their medical history includes obstructive sleep apnea, permanent atrial fibrillation diagnosed in 2013, dilated cardiomyopathy, remote cocaine use and obesity.  The patient last saw [name] on December 01, 2021.  Rhythm control strategies were abandoned and early 2018.        Past Medical History:  Diagnosis Date   Fatty liver 06/2014   Fatty Liver was a disease that father had   Mitral regurgitation    a. TTE 2-16: EF 60-65%, mild MR/TR, nl RV sys fxn, nl PASP   Obesity    OSA (obstructive sleep apnea)    Persistent atrial fibrillation (HCC)    a. dx 2013 s/p DCCV; b. recurrent Afib 2016 s/p DCCV 11/25/2014; c. 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   hospitalization [facility] - stress with father's illness    Past Surgical History:  Procedure Laterality Date   CARDIOVASCULAR STRESS TEST  ~2005   thinks at [facility], told normal   carotid US  08/2014   no plaque   COLONOSCOPY  2000   WNL, for weight loss after started gym   MRI  08/2014   brain - WNL, skull base C1/2 articulation   TONSILLECTOMY AND ADENOIDECTOMY  childhood   US ECHOCARDIOGRAPHY  08/2014   EF 60%, nl vent fxn, mildly dilated LA, mild MR, mild TR    Current Medications: No outpatient medications have been marked as taking for the 06/29/22 encounter (Appointment) with Lanier Prude, MD.     Allergies:   Vicodin [hydrocodone-acetaminophen]   Social History   Socioeconomic History   Marital status: Divorced     Spouse name: Not on file   Number of children: Not on file   Years of education: Not on file   Highest education level: Not on file  Occupational History   Not on file  Tobacco Use   Smoking status: Former    Types: Cigarettes    Quit date: 08/29/1996    Years since quitting: 25.8   Smokeless tobacco: Never  Vaping Use   Vaping Use: Never used  Substance and Sexual Activity   Alcohol use: Not Currently    Alcohol/week: 2.0 standard drinks of alcohol    Types: 2 Cans of beer per week    Comment: quit beginning of 2020   Drug use: Not Currently    Frequency: 5.0 times per week    Types: Cocaine    Comment: 3-5 grams a day, quit beginning of year 2020    Sexual activity: Yes  Other Topics Concern   Not on file  Social History Narrative   Occupation: truck driver, just started back   Edu: 9th grade, GED   Social Determinants of Health   Financial Resource Strain: Not on file  Food Insecurity: Not on file  Transportation Needs: Not on file  Physical Activity: Inactive (04/18/2019)   Exercise Vital Sign    Days of Exercise per Week: 0 days    Minutes of Exercise  per Session: 0 min  Stress: Not on file  Social Connections: Not on file     Family History: The patient's family history includes CAD (age of onset: 78) in his sister; Cirrhosis in his father; Coronary artery disease (age of onset: 31) in his father; Diabetes in his mother; GI Disease in his sister; Heart attack in his father; Heart disease in his mother; Hypercalcemia in his father; Hypertension in his father. There is no history of Stroke.  ROS:   Please see the history of present illness.    All other systems reviewed and are negative.  EKGs/Labs/Other Studies Reviewed:    The following studies were reviewed today:  November 29, 2021 SPECT No ischemia  November 23, 2017 echo EF 45 to 50% Mildly dilated left atrium  May 21, 2021 ECG shows atrial fibrillation with a rapid ventricular rate.  QTc is 410  ms.    Recent Labs: 12/27/2021: ALT 47; BUN 19; Creatinine, Ser 1.11; Hemoglobin 16.4; Platelets 354.0; Potassium 4.8; Sodium 137; TSH 2.45  Recent Lipid Panel    Component Value Date/Time   CHOL 222 (H) 12/27/2021 1218   CHOL 157 09/25/2014 1731   CHOL 175 10/10/2011 0000   TRIG 206.0 (H) 12/27/2021 1218   TRIG 250 (H) 09/25/2014 1731   TRIG 93 10/10/2011 0000   HDL 43.70 12/27/2021 1218   HDL 33 (L) 09/25/2014 1731   CHOLHDL 5 12/27/2021 1218   VLDL 41.2 (H) 12/27/2021 1218   VLDL 50 (H) 09/25/2014 1731   LDLCALC 103 (H) 09/08/2015 1036   LDLCALC 74 09/25/2014 1731   LDLDIRECT 149.0 12/27/2021 1218    Physical Exam:    VS:  There were no vitals taken for this visit.    Wt Readings from Last 3 Encounters:  12/27/21 220 lb 8 oz (100 kg)  12/01/21 224 lb (101.6 kg)  05/21/21 225 lb (102.1 kg)     GEN: *** Well nourished, well developed in no acute distress HEENT: Normal NECK: No JVD; No carotid bruits LYMPHATICS: No lymphadenopathy CARDIAC: ***RRR, no murmurs, rubs, gallops RESPIRATORY:  Clear to auscultation without rales, wheezing or rhonchi  ABDOMEN: Soft, non-tender, non-distended MUSCULOSKELETAL:  No edema; No deformity  SKIN: Warm and dry NEUROLOGIC:  Alert and oriented x 3 PSYCHIATRIC:  Normal affect       ASSESSMENT:    1. Dilated cardiomyopathy (HCC)    PLAN:    In order of problems listed above:  #Longstanding persistent atrial fibrillation He has been in atrial fibrillation at least since 2018.  He takes Xarelto for stroke prophylaxis.  He has a mildly reduced ejection fraction which is likely, in part, due to rapidly conducted atrial fibrillation.  Ideally, rhythm control would be achieved in the setting of his cardiomyopathy.  I discussed treatment options for the patient and have recommended an initial trial of dofetilide.  Rate control options using beta-blockers or calcium channel blockers will be difficult to achieve because of his hypotension.   His QTc and kidney function appear to be acceptable for initiating dofetilide.  I discussed the process and he wishes to proceed.***  #Chronic systolic heart failure NYHA class II.  Warm and dry on exam.  I would like to repeat his echo given his hypotension. Continue digoxin, metoprolol.    Medication Adjustments/Labs and Tests Ordered: Current medicines are reviewed at length with the patient today.  Concerns regarding medicines are outlined above.  No orders of the defined types were placed in this encounter.  No  orders of the defined types were placed in this encounter.    Signed, Rossie Muskrat. Lalla Brothers, MD, [facility], [facility] 06/29/2022 5:13 AM    Electrophysiology [facility]

## 2022-06-30 ENCOUNTER — Encounter: Payer: Self-pay | Admitting: Cardiology

## 2022-07-19 ENCOUNTER — Telehealth: Payer: Self-pay | Admitting: Family Medicine

## 2022-07-19 ENCOUNTER — Other Ambulatory Visit: Payer: Self-pay

## 2022-07-19 MED ORDER — TRIAMCINOLONE ACETONIDE 0.1 % EX CREA: TOPICAL_CREAM | CUTANEOUS | 0 refills | Status: DC

## 2022-07-19 NOTE — Telephone Encounter (Signed)
  Encourage patient to contact the pharmacy for refills or they can request refills through [facility]  LAST APPOINTMENT DATE:  Please schedule appointment if longer than 1 year  NEXT APPOINTMENT DATE:  MEDICATION:triamcinolone cream (KENALOG) 0.1 %   Is the patient out of medication?   PHARMACY: [facility] - [place], [place] - [address]    Let patient know to contact pharmacy at the end of the day to make sure medication is ready.  Please notify patient to allow 48-72 hours to process  CLINICAL FILLS OUT ALL BELOW:   LAST REFILL:  QTY:  REFILL DATE:    OTHER COMMENTS:    Okay for refill?  Please advise

## 2022-08-24 ENCOUNTER — Ambulatory Visit: Payer: 59 | Admitting: Family Medicine

## 2022-08-25 ENCOUNTER — Other Ambulatory Visit: Payer: Self-pay | Admitting: Family Medicine

## 2022-08-25 MED ORDER — TRIAMCINOLONE ACETONIDE 0.1 % EX CREA: TOPICAL_CREAM | CUTANEOUS | 0 refills | Status: DC

## 2022-08-25 NOTE — Telephone Encounter (Signed)
  Encourage patient to contact the pharmacy for refills or they can request refills through [facility]  LAST APPOINTMENT DATE:  Please schedule appointment if longer than 1 year  NEXT APPOINTMENT DATE:  MEDICATION:triamcinolone cream (KENALOG) 0.1 %   PARoxetine (PAXIL) 40 MG tablet   Is the patient out of medication?   PHARMACY: [facility] - [place], [place] - [address]    Let patient know to contact pharmacy at the end of the day to make sure medication is ready.  Please notify patient to allow 48-72 hours to process  CLINICAL FILLS OUT ALL BELOW:   LAST REFILL:  QTY:  REFILL DATE:    OTHER COMMENTS:    Okay for refill?  Please advise

## 2022-08-25 NOTE — Telephone Encounter (Signed)
Spoke with pt relaying Dr. Timoteo Expose message concerning refill and derm appt. Pt verbalizes understanding and expresses his thanks.

## 2022-08-25 NOTE — Telephone Encounter (Signed)
Kenalog cream Last rx: 07/19/22, #454 g Last OV:  12/27/21, CPE Next OV:  none  Spoke with pt about Paxil refill notifying him he should have enough to last through end of 11/2022. Pt verbalizes understanding and will call in refill when he needs it.

## 2022-08-25 NOTE — Telephone Encounter (Signed)
He missed dermatology appt 06/2022. He needs to keep 09/2022 appt with Dr Gwen Pounds dermatology. I will refill until then.   Pt also missed 2 appts with me in the past few months, latest yesterday.  Will forward to [place]. Please send no show letter.

## 2022-08-26 ENCOUNTER — Ambulatory Visit: Payer: 59

## 2022-09-07 ENCOUNTER — Other Ambulatory Visit: Payer: Self-pay | Admitting: Family Medicine

## 2022-09-07 NOTE — Telephone Encounter (Signed)
Prescription Request  09/07/2022  Is this a "Controlled Substance" medicine? No  LOV: 08/24/22  What is the name of the medication or equipment? triamcinolone cream (KENALOG) 0.1 %   Have you contacted your pharmacy to request a refill? No   Which pharmacy would you like this sent to?  [facility] - [place], [place] - [address] [address] Phone: [phone] Fax: [phone]   Patient notified that their request is being sent to the clinical staff for review and that they should receive a response within 2 business days.   Please advise at Mobile [phone] (mobile)  Patient is a on the road driver and would like to know if maybe 2 jars of this cream can be sent in for him,until he makes it to his next appointment? His sister is going to mail it to him.

## 2022-09-07 NOTE — Telephone Encounter (Signed)
Last refill 08/25/22  80 G Last office visit 12/27/21

## 2022-09-10 MED ORDER — TRIAMCINOLONE ACETONIDE 0.1 % EX CREA: TOPICAL_CREAM | CUTANEOUS | 0 refills | Status: DC

## 2022-09-13 ENCOUNTER — Telehealth: Payer: Self-pay | Admitting: Family Medicine

## 2022-09-13 MED ORDER — TRIAMCINOLONE ACETONIDE 0.1 % EX CREA: TOPICAL_CREAM | CUTANEOUS | 0 refills | Status: DC

## 2022-09-13 NOTE — Telephone Encounter (Signed)
[facility]

## 2022-09-13 NOTE — Telephone Encounter (Signed)
Prescription Request  09/13/2022  Is this a "Controlled Substance" medicine? No  LOV: 08/24/2022  What is the name of the medication or equipment? triamcinolone cream (KENALOG) 0.1 %  1 pound jar Have you contacted your pharmacy to request a refill? Yes   Which pharmacy would you like this sent to?   [facility] - [place], [place] - [address] [address] Phone: [phone] Fax: [phone]   Patient notified that their request is being sent to the clinical staff for review and that they should receive a response within 2 business days.   Please advise at Mobile [phone] (mobile)

## 2022-09-13 NOTE — Telephone Encounter (Signed)
Spoke to patient by telephone and advised him that Dr. Danise Mina sent in a prescription on 09/10/22. Patient stated if it is the same size tube that was sent in last time it will only last him a few days because he has psoriasis over a large area of his skin. Patient stated that he would like a one pound jar sent in like Dr. Danise Mina has sent in for  him previously. Patient stated because the tube was sent in his insurance will not let them fill the tube again until 09/17/22. Patient that he is working in [place] now and his sister is going to pick it up and Fedex it overnight to him.   Spoke to pharmacist at [facility] and was advised that they do have the triamcinolone cream in a one pound jar. [facility]acy [facility]

## 2022-09-14 NOTE — Telephone Encounter (Signed)
Patient notified by telephone that new script has been sent to the pharmacy. Patient stated that he will reach out to [facility] and have them fill it for him instead of the tube.

## 2022-10-10 ENCOUNTER — Ambulatory Visit: Payer: 59 | Admitting: Dermatology

## 2022-10-12 ENCOUNTER — Emergency Department
Admission: EM | Admit: 2022-10-12 | Discharge: 2022-10-12 | Disposition: A | Discharge status: Home / Self Care | Payer: 59 | Attending: Emergency Medicine | Admitting: Emergency Medicine

## 2022-10-12 ENCOUNTER — Emergency Department: Payer: 59

## 2022-10-12 ENCOUNTER — Encounter: Payer: Self-pay | Admitting: Emergency Medicine

## 2022-10-12 ENCOUNTER — Other Ambulatory Visit: Payer: Self-pay

## 2022-10-12 DIAGNOSIS — I4811 Longstanding persistent atrial fibrillation: Secondary | ICD-10-CM | POA: Diagnosis not present

## 2022-10-12 DIAGNOSIS — R0789 Other chest pain: Secondary | ICD-10-CM | POA: Diagnosis present

## 2022-10-12 DIAGNOSIS — Z7901 Long term (current) use of anticoagulants: Secondary | ICD-10-CM | POA: Insufficient documentation

## 2022-10-12 DIAGNOSIS — F419 Anxiety disorder, unspecified: Secondary | ICD-10-CM | POA: Diagnosis not present

## 2022-10-12 DIAGNOSIS — Z79899 Other long term (current) drug therapy: Secondary | ICD-10-CM | POA: Insufficient documentation

## 2022-10-12 DIAGNOSIS — F1490 Cocaine use, unspecified, uncomplicated: Secondary | ICD-10-CM

## 2022-10-12 DIAGNOSIS — R079 Chest pain, unspecified: Secondary | ICD-10-CM

## 2022-10-12 HISTORY — DX: Depression, unspecified: F32.A

## 2022-10-12 HISTORY — DX: Heart failure, unspecified: I50.9

## 2022-10-12 HISTORY — DX: Anxiety disorder, unspecified: F41.9

## 2022-10-12 LAB — CBC WITH DIFFERENTIAL/PLATELET
Abs Immature Granulocytes: 0.1 10*3/uL — ABNORMAL HIGH (ref 0.00–0.07)
Basophils Absolute: 0.1 10*3/uL (ref 0.0–0.1)
Basophils Relative: 1 %
Eosinophils Absolute: 0 10*3/uL (ref 0.0–0.5)
Eosinophils Relative: 0 %
HCT: 46.9 % (ref 39.0–52.0)
Hemoglobin: 15.7 g/dL (ref 13.0–17.0)
Immature Granulocytes: 1 %
Lymphocytes Relative: 8 %
Lymphs Abs: 1.3 10*3/uL (ref 0.7–4.0)
MCH: 31.8 pg (ref 26.0–34.0)
MCHC: 33.5 g/dL (ref 30.0–36.0)
MCV: 95.1 fL (ref 80.0–100.0)
Monocytes Absolute: 0.9 10*3/uL (ref 0.1–1.0)
Monocytes Relative: 5 %
Neutro Abs: 14.3 10*3/uL — ABNORMAL HIGH (ref 1.7–7.7)
Neutrophils Relative %: 85 %
Platelets: 432 10*3/uL — ABNORMAL HIGH (ref 150–400)
RBC: 4.93 MIL/uL (ref 4.22–5.81)
RDW: 12.8 % (ref 11.5–15.5)
WBC: 16.7 10*3/uL — ABNORMAL HIGH (ref 4.0–10.5)
nRBC: 0 % (ref 0.0–0.2)

## 2022-10-12 LAB — COMPREHENSIVE METABOLIC PANEL
ALT: 41 U/L (ref 0–44)
AST: 48 U/L — ABNORMAL HIGH (ref 15–41)
Albumin: 4.4 g/dL (ref 3.5–5.0)
Alkaline Phosphatase: 51 U/L (ref 38–126)
Anion gap: 11 (ref 5–15)
BUN: 21 mg/dL — ABNORMAL HIGH (ref 6–20)
CO2: 26 mmol/L (ref 22–32)
Calcium: 9.6 mg/dL (ref 8.9–10.3)
Chloride: 98 mmol/L (ref 98–111)
Creatinine, Ser: 1.17 mg/dL (ref 0.61–1.24)
GFR, Estimated: >60 mL/min (ref >60)
Glucose, Bld: 179 mg/dL — ABNORMAL HIGH (ref 70–99)
Potassium: 4.4 mmol/L (ref 3.5–5.1)
Sodium: 135 mmol/L (ref 135–145)
Total Bilirubin: 1.2 mg/dL (ref 0.3–1.2)
Total Protein: 8.7 g/dL — ABNORMAL HIGH (ref 6.5–8.1)

## 2022-10-12 LAB — TROPONIN I (HIGH SENSITIVITY)
Troponin I (High Sensitivity): 4 ng/L (ref <18)
Troponin I (High Sensitivity): 3 ng/L (ref <18)

## 2022-10-12 LAB — DIGOXIN LEVEL: Digoxin Level: 0.6 ng/mL — ABNORMAL LOW (ref 0.8–2.0)

## 2022-10-12 LAB — BRAIN NATRIURETIC PEPTIDE: B Natriuretic Peptide: 162.2 pg/mL — ABNORMAL HIGH (ref 0.0–100.0)

## 2022-10-12 MED ORDER — LORAZEPAM 2 MG/ML IJ SOLN
2.0000 mg | Freq: Once | INTRAMUSCULAR | Status: AC
  Administered 2022-10-12: 2 mg via INTRAVENOUS
  Filled 2022-10-12: qty 1

## 2022-10-12 MED ORDER — SODIUM CHLORIDE 0.9 % IV BOLUS
1000.0000 mL | Freq: Once | INTRAVENOUS | Status: AC
  Administered 2022-10-12: 1000 mL via INTRAVENOUS

## 2022-10-12 MED ORDER — DILTIAZEM HCL 60 MG PO TABS
30.0000 mg | ORAL_TABLET | Freq: Once | ORAL | Status: AC
  Administered 2022-10-12: 30 mg via ORAL
  Filled 2022-10-12: qty 1

## 2022-10-12 MED ORDER — DILTIAZEM HCL 25 MG/5ML IV SOLN
10.0000 mg | Freq: Once | INTRAVENOUS | Status: AC
  Administered 2022-10-12: 10 mg via INTRAVENOUS
  Filled 2022-10-12: qty 5

## 2022-10-12 NOTE — Discharge Instructions (Addendum)
Please do not use any more cocaine and follow-up at your regularly scheduled cardiology/electrophysiology appointment later this week.

## 2022-10-12 NOTE — ED Provider Notes (Addendum)
[facility] Provider Note   Event Date/Time   First MD Initiated Contact with Patient 10/12/22 0831     (approximate) History  Chest Pain  HPI Stanley Kane is a 57 y.o. male with a stated past medical history of persistent atrial fibrillation currently on digoxin, metoprolol, and Xarelto who presents for chest pain after cocaine use just prior to arrival.  Patient states that it [birth year] his birthday and has not used cocaine in a long time.  Patient states that the pain occurred soon after ingestion of this cocaine and has been present since onset.  EMS found patient's heart rates to be in the 170s and in atrial fibrillation.  Patient describes 4/10, central, nonradiating chest pain that has been present since onset and somewhat improved ROS: Patient currently denies any vision changes, tinnitus, difficulty speaking, facial droop, sore throat, shortness of breath, abdominal pain, nausea/vomiting/diarrhea, dysuria, or weakness/numbness/paresthesias in any extremity   Physical Exam  Triage Vital Signs: ED Triage Vitals  Enc Vitals Group     BP      Pulse      Resp      Temp      Temp src      SpO2      Weight      Height      Head Circumference      Peak Flow      Pain Score      Pain Loc      Pain Edu?      Excl. in [place]?    Most recent vital signs: Vitals:   10/12/22 1045 10/12/22 1100  BP:  104/62  Pulse:  (!) 116  Resp:  17  Temp:    SpO2: 96% 98%   General: Awake, oriented x4. CV:  Good peripheral perfusion.  Resp:  Normal effort.  Abd:  No distention.  Other:  Middle-aged overweight Caucasian male laying in bed in mild distress secondary to anxiety ED Results / Procedures / Treatments  Labs (all labs ordered are listed, but only abnormal results are displayed) Labs Reviewed  COMPREHENSIVE METABOLIC PANEL - Abnormal; Notable for the following components:      Result Value   Glucose, Bld 179 (*)    BUN 21 (*)    Total Protein 8.7 (*)    AST  48 (*)    All other components within normal limits  CBC WITH DIFFERENTIAL/PLATELET - Abnormal; Notable for the following components:   WBC 16.7 (*)    Platelets 432 (*)    Neutro Abs 14.3 (*)    Abs Immature Granulocytes 0.10 (*)    All other components within normal limits  BRAIN NATRIURETIC PEPTIDE - Abnormal; Notable for the following components:   B Natriuretic Peptide 162.2 (*)    All other components within normal limits  DIGOXIN LEVEL - Abnormal; Notable for the following components:   Digoxin Level 0.6 (*)    All other components within normal limits  TROPONIN I (HIGH SENSITIVITY)  TROPONIN I (HIGH SENSITIVITY)   EKG ED ECG REPORT I, Naaman Plummer, the attending physician, personally viewed and interpreted this ECG. Date: 10/12/2022 EKG Time: 0828 Rate: 144 Rhythm: Atrial fibrillation with rapid ventricular response QRS Axis: normal Intervals: normal ST/T Wave abnormalities: normal Narrative Interpretation: Patient with rapid ventricular response.  No evidence of acute ischemia RADIOLOGY ED MD interpretation: One-view portable chest x-ray interpreted by me shows no evidence of acute abnormalities including no pneumonia, pneumothorax, or widened  mediastinum -Agree with radiology assessment Official radiology report(s): DG Chest Port 1 View  Result Date: 10/12/2022 CLINICAL DATA:  Chest pain, cocaine use EXAM: PORTABLE CHEST 1 VIEW COMPARISON:  11/29/2017 FINDINGS: The heart size and mediastinal contours are within normal limits. Both lungs are clear. The visualized skeletal structures are unremarkable. IMPRESSION: No active disease. Electronically Signed   By: Jerilynn Mages.  Shick M.D.   On: 10/12/2022 08:45   PROCEDURES: Critical Care performed: No .1-3 Lead EKG Interpretation  Performed by: Naaman Plummer, MD Authorized by: Naaman Plummer, MD     Interpretation: normal     ECG rate:  99   ECG rate assessment: normal     Rhythm: atrial fibrillation     Ectopy: none      Conduction: normal    MEDICATIONS ORDERED IN ED: Medications  sodium chloride 0.9 % bolus 1,000 mL (0 mLs Intravenous Stopped 10/12/22 1001)  LORazepam (ATIVAN) injection 2 mg (2 mg Intravenous Given 10/12/22 0846)  sodium chloride 0.9 % bolus 1,000 mL (1,000 mLs Intravenous New Bag/Given 10/12/22 1008)  diltiazem (CARDIZEM) injection 10 mg (10 mg Intravenous Given 10/12/22 1007)  diltiazem (CARDIZEM) tablet 30 mg (30 mg Oral Given 10/12/22 1100)   IMPRESSION / MDM / ASSESSMENT AND PLAN / ED COURSE  I reviewed the triage vital signs and the nursing notes.                             The patient is on the cardiac monitor to evaluate for evidence of arrhythmia and/or significant heart rate changes. Patient's presentation is most consistent with acute presentation with potential threat to life or bodily function. 57 year old male with the above-stated past medical history presents for chest pain after cocaine ingestion just prior to arrival Workup: ECG, CXR, CBC, BMP, Troponin Findings: ECG: No overt evidence of STEMI. No evidence of Brugadas sign, delta wave, epsilon wave, significantly prolonged QTc, or malignant arrhythmia HS Troponin: Negative x1 Other Labs unremarkable for emergent problems. CXR: Without PTX, PNA, or widened mediastinum Last coronary CT:  2018 (score 0) HEART Score: 4  Given History, Exam, and Workup I have low suspicion for ACS, Pneumothorax, Pneumonia, Pulmonary Embolus, Tamponade, Aortic Dissection or other emergent problem as a cause for this presentation.   Reassesment: Prior to discharge patients pain was controlled and they were well appearing.  Disposition:  Discharge. Strict return precautions discussed with patient with full understanding. Advised patient to follow up promptly with primary care provider   FINAL CLINICAL IMPRESSION(S) / ED DIAGNOSES   Final diagnoses:  Chest pain, unspecified type  Longstanding persistent atrial fibrillation ([place])   Cocaine use   Rx / DC Orders   ED Discharge Orders     None      Note:  This document was prepared using Dragon voice recognition software and may include unintentional dictation errors.   Naaman Plummer, MD 10/12/22 1114    Naaman Plummer, MD 10/12/22 1114

## 2022-10-12 NOTE — ED Notes (Signed)
Pt's chest remains sweaty; new lead sticker applied for L arm as came loose.

## 2022-10-12 NOTE — ED Triage Notes (Signed)
Arrives from home via [facility]>  Patient used "a lot" of cocaine this morning at around 0400.  Has history of cocaine use, but is not a daily user.  C/O chest pain.  Patient is anxious on arrival.  Per report HR:  afib RVR 150 - 200.  Bp 143/117  RR:  30  325 mg ASA given PTA.  Patient is AAOx3.  Jittery, anxious. Sweat to forehead.  Behavior bizarre.  Follows commands and endorses anxiety and paranoia at this time.

## 2022-10-12 NOTE — ED Notes (Signed)
EDP Bradler at bedside. NT remains at bedside.

## 2022-10-12 NOTE — ED Notes (Signed)
Patient is alert and orient.

## 2022-10-12 NOTE — ED Notes (Signed)
Non-slip socks placed on pt's feet and pt given paper scrub pants since he did not have any on arrival. Sister is pt's ride home.

## 2022-10-12 NOTE — ED Notes (Signed)
Pt alert, calm and currently cooperative with staff. NT remains at bedside. HR remains in 150s; bolus running. Elana RN will check in on pt soon as this RN stuck in emergency with other pt.

## 2022-10-12 NOTE — ED Notes (Signed)
Rash to pt's skin; pt states has been dealing with it for about 6 months. States PCP gave him cream for it and will see dermatologist soon.

## 2022-10-13 ENCOUNTER — Telehealth: Payer: Self-pay

## 2022-10-13 NOTE — Transitions of Care (Post Inpatient/ED Visit) (Signed)
   10/13/2022  Name: Stanley Kane MRN: [id number] DOB: 10-Jul-1966  Today's TOC FU Call Status: Today's TOC FU Call Status:: Unsuccessul Call (1st Attempt) Unsuccessful Call (1st Attempt) Date: 10/13/22  Attempted to reach the patient regarding the most recent Inpatient/ED visit.  Follow Up Plan: Additional outreach attempts will be made to reach the patient to complete the Transitions of Care (Post Inpatient/ED visit) call.   [place] LPN [place] Advisor Direct Dial [phone]

## 2022-10-14 ENCOUNTER — Encounter: Payer: Self-pay | Admitting: Nurse Practitioner

## 2022-10-14 ENCOUNTER — Ambulatory Visit: Payer: 59 | Attending: Nurse Practitioner | Admitting: Nurse Practitioner

## 2022-10-14 NOTE — Progress Notes (Deleted)
Office Visit    Patient Name: Stanley Kane Date of Encounter: 10/14/2022  Primary Care Provider:  Ria Bush, MD Primary Cardiologist:  Ida Rogue, MD  Chief Complaint    57 year old male with a history of permanent atrial fibrillation, dilated/nonischemic cardiomyopathy, mild mitral regurgitation, obesity, sleep apnea, prediabetes, depression, anxiety, and remote cocaine use, who presents for follow-up related to chest pain.  Past Medical History    Past Medical History:  Diagnosis Date   Anxiety    Chest pain    a. 09/2016 Cor Ca2+ scoring: 0; b. 08/2017 MV: No isch/infarct. EF 33-44%; c. 12/2019 MV: No isch/infarct. EF 46%; d. 11/2021 MV: No isch/infarct. Nl LVEF. No significant Ao/Cor Ca2+.   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 09/2014 Echo: EF 60-65%, mild MR/TR; b. 10/2017 Echo: EF 45-50%, no rwma, mildly dil LA, nl RV fxn.   Obesity    OSA (obstructive sleep apnea)    Persistent atrial fibrillation ([place])    a. dx 2013 s/p DCCV; b. recurrent Afib 2016 s/p DCCV 11/25/2014; c. 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   hospitalization [facility] - stress with father's illness   Past Surgical History:  Procedure Laterality Date   CARDIOVASCULAR STRESS TEST  ~2005   thinks at [facility], told normal   carotid US  08/2014   no plaque   COLONOSCOPY  2000   WNL, for weight loss after started gym   MRI  08/2014   brain - WNL, skull base C1/2 articulation   TONSILLECTOMY AND ADENOIDECTOMY  childhood   US ECHOCARDIOGRAPHY  08/2014   EF 60%, nl vent fxn, mildly dilated LA, mild MR, mild TR    Allergies  Allergies  Allergen Reactions   Vicodin [Hydrocodone-Acetaminophen]     Makes him "jittery"    History of Present Illness    57 year old male with above past medical history  including permanent atrial fibrillation, dilated/nonischemic cardiomyopathy, mild mitral regurgitation, obesity, sleep apnea, prediabetes, depression, anxiety, and remote cocaine use.  He was initially diagnosed with atrial fibrillation in 2013 and subsequently underwent cardioversion in November 2013, however, he had recurrent atrial fibrillation in the setting of medication noncompliance and required repeat cardioversion in March 2016.  He was initially placed on flecainide however, this was discontinued in May 2016 when he had recurrent atrial fibrillation with adequate rate control and without symptoms.  He has been managed with rate control strategy with metoprolol digoxin.  He is currently anticoagulated with Xarelto.  He had normal LV function by echo in February 2016, however, in January 2019, he underwent stress testing which showed no evidence of ischemia or scar but his EF was calculated at 33 to 44%.  This was followed by an echocardiogram in March 2019 showed an EF of 45 to 50% without regional wall motion abnormalities, normal RV function, mildly dilated right ventricle.  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  On the occasions, no evidence of ischemia or infarct was found and CT attenuation images showed no significant aortic or coronary calcifications.  Mr. Fuhrmann was last seen in cardiology clinic in April 2023, at which time he was feeling well.  Beta-blocker digoxin therapy were continued.  He was referred to EP for A-fib, but did not follow through.  On 30[birth year] his birthday, he used cocaine subsequently developed chest pain.  He called EMS was found to be tachycardic in 170 bpm in atrial fibrillation.  He was taken to the emergency department where he remained tachycardic at 116.  Troponins were normal x 2.  Lab work and chest x-ray were relatively unremarkable, the leukocytosis was noted (16.7).  He was discharged and advised to  follow-up with cardiology.  Home Medications    Current Outpatient Medications  Medication Sig Dispense Refill   digoxin (LANOXIN) 0.125 MG tablet Take 1 tablet (0.125 mg total) by mouth daily. 90 tablet 3   furosemide (LASIX) 20 MG tablet Take 1 tablet (20 mg total) by mouth as needed for edema. 90 tablet 3   metoprolol succinate (TOPROL-XL) 25 MG 24 hr tablet TAKE 1 TABLET BY MOUTH DAILY. 90 tablet 3   Multiple Vitamin (MULTIVITAMIN ADULT) TABS Take 1 tablet by mouth daily.     PARoxetine (PAXIL) 40 MG tablet Take 1 tablet (40 mg total) by mouth every morning. 90 tablet 3   rivaroxaban (XARELTO) 20 MG TABS tablet Take 1 tablet (20 mg total) by mouth daily with supper. 90 tablet 3   triamcinolone cream (KENALOG) 0.1 % APPLY 1 APPLICATION TOPICALLY ONTO THE AFFECTED AREAS 2 TIMES DAILY. 453.6 g 0   No current facility-administered medications for this visit.     Review of Systems    ***.  All other systems reviewed and are otherwise negative except as noted above.    Physical Exam    VS:  There were no vitals taken for this visit. , BMI There is no height or weight on file to calculate BMI.     GEN: Well nourished, well developed, in no acute distress. HEENT: normal. Neck: Supple, no JVD, carotid bruits, or masses. Cardiac: RRR, no murmurs, rubs, or gallops. No clubbing, cyanosis, edema.  Radials 2+/PT 2+ and equal bilaterally.  Respiratory:  Respirations regular and unlabored, clear to auscultation bilaterally. GI: Soft, nontender, nondistended, BS + x 4. MS: no deformity or atrophy. Skin: warm and dry, no rash. Neuro:  Strength and sensation are intact. Psych: Normal affect.  Accessory Clinical Findings    ECG personally reviewed by me today - *** - no acute changes.  Lab Results  Component Value Date   WBC 16.7 (H) 10/12/2022   HGB 15.7 10/12/2022   HCT 46.9 10/12/2022   MCV 95.1 10/12/2022   PLT 432 (H) 10/12/2022   Lab Results  Component Value Date   CREATININE  1.17 10/12/2022   BUN 21 (H) 10/12/2022   NA 135 10/12/2022   K 4.4 10/12/2022   CL 98 10/12/2022   CO2 26 10/12/2022   Lab Results  Component Value Date   ALT 41 10/12/2022   AST 48 (H) 10/12/2022   ALKPHOS 51 10/12/2022   BILITOT 1.2 10/12/2022   Lab Results  Component Value Date   CHOL 222 (H) 12/27/2021   HDL 43.70 12/27/2021   LDLCALC 103 (H) 09/08/2015   LDLDIRECT 149.0 12/27/2021   TRIG 206.0 (H) 12/27/2021   CHOLHDL 5 12/27/2021    Lab Results  Component Value Date   HGBA1C 6.0 12/27/2021    Assessment & Plan    1.  ***   Murray Hodgkins, NP 10/14/2022, 9:59 AM

## 2022-10-15 ENCOUNTER — Encounter: Payer: Self-pay | Admitting: Family Medicine

## 2022-10-15 DIAGNOSIS — F1490 Cocaine use, unspecified, uncomplicated: Secondary | ICD-10-CM | POA: Insufficient documentation

## 2022-10-15 DIAGNOSIS — F1491 Cocaine use, unspecified, in remission: Secondary | ICD-10-CM | POA: Insufficient documentation

## 2022-10-17 ENCOUNTER — Other Ambulatory Visit: Payer: Self-pay | Admitting: Family Medicine

## 2022-10-17 ENCOUNTER — Telehealth: Payer: Self-pay | Admitting: Family Medicine

## 2022-10-17 NOTE — Transitions of Care (Post Inpatient/ED Visit) (Signed)
   10/17/2022  Name: Stanley Kane MRN: [id number] DOB: 27-Mar-1966  Today's TOC FU Call Status: Today's TOC FU Call Status:: Successful TOC FU Call Competed Unsuccessful Call (1st Attempt) Date: 10/13/22 [facility] FU Call Complete Date: 10/17/22  Transition Care Management Follow-up Telephone Call Date of Discharge: 10/12/22 Discharge Facility: [facility]) Type of Discharge: Emergency Department Reason for ED Visit: Other: Cardiac Conditions Diagnosis:  (chest pain unspecified) How have you been since you were released from the hospital?: Better Any questions or concerns?: No  Items Reviewed: Did you receive and understand the discharge instructions provided?: Yes Medications obtained and verified?: Yes (Medications Reviewed) Any new allergies since your discharge?: No Dietary orders reviewed?: NA Do you have support at home?: Yes  Home Care and Equipment/Supplies: [place] Ordered?: NA Any new equipment or medical supplies ordered?: NA  Functional Questionnaire: Do you need assistance with bathing/showering or dressing?: No Do you need assistance with meal preparation?: No Do you need assistance with eating?: No Do you have difficulty maintaining continence: No Do you need assistance with getting out of bed/getting out of a chair/moving?: No Do you have difficulty managing or taking your medications?: No  Folllow up appointments reviewed: PCP Follow-up appointment confirmed?: No (refused) MD Provider Line [phone] Given: Yes [facility] Follow-up appointment confirmed?: NA Do you need transportation to your follow-up appointment?: No Do you understand care options if your condition(s) worsen?: Yes-patient verbalized understanding    [place] LPN [place] Direct Dial [phone]

## 2022-10-17 NOTE — Telephone Encounter (Signed)
Prescription Request  10/17/2022  Is this a "Controlled Substance" medicine? No  LOV: 12/27/2021  What is the name of the medication or equipment? triamcinolone cream (KENALOG) 0.1 %   Have you contacted your pharmacy to request a refill? No   Which pharmacy would you like this sent to?  [facility] - [place], [place] - [address] [place] [address] Phone: [phone] Fax: [phone]   Patient notified that their request is being sent to the clinical staff for review and that they should receive a response within 2 business days.   Please advise at Mobile [phone] (mobile)

## 2022-10-18 NOTE — Telephone Encounter (Signed)
Request already fwd to Dr. Darnell Level to authorize refill. (See 10/17/22 refill note.)

## 2022-10-18 NOTE — Telephone Encounter (Signed)
Kenalog cream Last filled:  09/13/22, #456.6 g Last OV:  12/27/21, CPE Next OV:  none

## 2022-11-23 ENCOUNTER — Ambulatory Visit: Payer: 59 | Admitting: Dermatology

## 2022-11-28 ENCOUNTER — Other Ambulatory Visit: Payer: Self-pay

## 2022-11-28 ENCOUNTER — Telehealth: Payer: Self-pay | Admitting: Cardiovascular Disease

## 2022-11-28 NOTE — Telephone Encounter (Signed)
Pt is here for patient assistance for medication.

## 2022-11-28 NOTE — Telephone Encounter (Signed)
Patient here requesting assistance with the cost of his medication. Digoxin, Metoprolol succinate, and Xarelto. Reviewed GoodRX options which offer lower prices. Advised that the digoxin and metoprolol are $9.00 at [facility]. The xarelto he can call number on card provided to get medication for $85. He verbalized understanding of all information provided and had no further questions at this time.

## 2022-12-20 ENCOUNTER — Telehealth: Payer: Self-pay | Admitting: Cardiovascular Disease

## 2022-12-20 NOTE — Telephone Encounter (Signed)
Patient is returning call. Patient stated they will be in a meeting from 2PM - 3PM today.

## 2022-12-20 NOTE — Telephone Encounter (Signed)
Left voicemail message to call back for review of information.    Previous information provided to patient.     11/28/22  9:37 AM Note Patient here requesting assistance with the cost of his medication. Digoxin, Metoprolol succinate, and Xarelto. Reviewed GoodRX options which offer lower prices. Advised that the digoxin and metoprolol are $9.00 at [facility]. The xarelto he can call number on card provided to get medication for $85. He verbalized understanding of all information provided and had no further questions at this time.

## 2022-12-20 NOTE — Telephone Encounter (Signed)
Patient is calling stating he has not heard anything back regarding the patient assistance for his Xarelto. Please advise.

## 2022-12-20 NOTE — Telephone Encounter (Signed)
Patient was returning call. Please advise ?

## 2022-12-21 NOTE — Telephone Encounter (Signed)
Spoke with patient and reviewed the last time we spoke. I printed out coupons for his medications and gave him a card with number to call for his xarelto. Advised that we did not fill out assistance application but that he can get it at discounted rate. Discussed that through the company his cost will be $85 per month. Provided him with number to call for assistance with his medication. He verbalized understanding of our conversation with no further questions at this time.

## 2022-12-24 ENCOUNTER — Other Ambulatory Visit: Payer: Self-pay | Admitting: Medical

## 2022-12-26 ENCOUNTER — Telehealth: Payer: Self-pay | Admitting: Cardiovascular Disease

## 2022-12-26 ENCOUNTER — Other Ambulatory Visit: Payer: Self-pay | Admitting: Family Medicine

## 2022-12-26 DIAGNOSIS — R21 Rash and other nonspecific skin eruption: Secondary | ICD-10-CM

## 2022-12-26 MED ORDER — METOPROLOL SUCCINATE ER 25 MG PO TB24: 25.0000 mg | ORAL_TABLET | Freq: Every day | ORAL | 0 refills | Status: DC

## 2022-12-26 MED ORDER — RIVAROXABAN 20 MG PO TABS: 20.0000 mg | ORAL_TABLET | Freq: Every day | ORAL | 0 refills | Status: DC

## 2022-12-26 MED ORDER — DIGOXIN 125 MCG PO TABS: 125.0000 ug | ORAL_TABLET | Freq: Every day | ORAL | 0 refills | Status: DC

## 2022-12-26 NOTE — Telephone Encounter (Signed)
*  STAT* If patient is at the pharmacy, call can be transferred to refill team.   1. Which medications need to be refilled? (please list name of each medication and dose if known)   digoxin (LANOXIN) 0.125 MG tablet    metoprolol succinate (TOPROL-XL) 25 MG 24 hr tablet    2. Which pharmacy/location (including street and city if local pharmacy) is medication to be sent to?   [facility] - [place], [place] - [address]    3. Do they need a 30 day or 90 day supply? 90     *STAT* If patient is at the pharmacy, call can be transferred to refill team.   1. Which medications need to be refilled? (please list name of each medication and dose if known)   rivaroxaban (XARELTO) 20 MG TABS tablet    2. Which pharmacy/location (including street and city if local pharmacy) is medication to be sent to?   [facility] [address] [place], [place] - [address] Phone: [phone]  Fax: [phone]      3. Do they need a 30 day or 90 day supply? 90     Pt states he does not have insurance right now, unable to sch f/u right now

## 2022-12-26 NOTE — Telephone Encounter (Signed)
Prescription Request  12/26/2022  LOV: 12/27/2021  What is the name of the medication or equipment?  triamcinolone cream (KENALOG) 0.1 %    Patient would prefer a 1 pound jar, instead of tub  Patient does not have insurance, so has not gone to Dermatology, will once insurance is back in 90 days  Have you contacted your pharmacy to request a refill? Yes   Which pharmacy would you like this sent to?  [facility] - [place], [place] - [address] Phone: [phone]  Fax: [phone]        Patient notified that their request is being sent to the clinical staff for review and that they should receive a response within 2 business days.   Please advise at [facility] [phone]

## 2022-12-26 NOTE — Telephone Encounter (Signed)
Please advise if ok to refill following medications. Pt overdue for 6 month f/u last seen 11/2021. Pt c/o not having insurance unable to schedule appointment at this time.

## 2022-12-26 NOTE — Telephone Encounter (Signed)
Please advise for refill?  

## 2022-12-26 NOTE — Telephone Encounter (Signed)
Spoke with patient and reviewed that we sent in 30 day supply and that he would need appointment before we can refill any further medications. He states that he has appt and needs refills to last until then. Advised to call back when he gets low on medications to see if we can get him in sooner.   He also wanted refill for Paxil and advised that would need to be done by his primary care provider. We refilled it before as a courtesy. So instructed him to call his PCP office for that.   He verbalized understanding of our conversation with no further questions at this time.

## 2022-12-26 NOTE — Telephone Encounter (Signed)
*  STAT* If patient is at the pharmacy, call can be transferred to refill team.   1. Which medications need to be refilled? (please list name of each medication and dose if known)  digoxin (LANOXIN) 0.125 MG tablet metoprolol succinate (TOPROL-XL) 25 MG 24 hr tablet  PARoxetine (PAXIL) 40 MG tablet   rivaroxaban (XARELTO) 20 MG TABS tablet    2. Which pharmacy/location (including street and city if local pharmacy) is medication to be sent to?  First 3 medications requesting to be sent to:  [facility] - [place], [place] - [address]   Xarelto needs to be sent to:  [facility] [address], [place] - [address]   3. Do they need a 30 day or 90 day supply? 30

## 2022-12-26 NOTE — Telephone Encounter (Signed)
Kenalog cream Last rx:  10/18/22, 453.6 g Last OV:  12/27/21, CPE Next OV:  none

## 2022-12-27 MED ORDER — TRIAMCINOLONE ACETONIDE 0.1 % EX CREA: TOPICAL_CREAM | CUTANEOUS | 0 refills | Status: DC

## 2023-01-06 ENCOUNTER — Other Ambulatory Visit: Payer: Self-pay

## 2023-01-06 ENCOUNTER — Telehealth: Payer: Self-pay

## 2023-01-06 MED ORDER — PAROXETINE HCL 40 MG PO TABS: 40.0000 mg | ORAL_TABLET | Freq: Every morning | ORAL | 3 refills | Status: DC

## 2023-01-06 MED ORDER — METOPROLOL SUCCINATE ER 25 MG PO TB24
25.0000 mg | ORAL_TABLET | Freq: Every day | ORAL | 1 refills | Status: DC
  Filled 2023-05-10: qty 30, 30d supply, fill #0

## 2023-01-06 MED ORDER — RIVAROXABAN 20 MG PO TABS: 20.0000 mg | ORAL_TABLET | Freq: Every day | ORAL | 0 refills | Status: DC

## 2023-01-06 MED ORDER — RIVAROXABAN 20 MG PO TABS: 20.0000 mg | ORAL_TABLET | Freq: Every day | ORAL | 1 refills | Status: DC

## 2023-01-06 MED ORDER — DIGOXIN 125 MCG PO TABS: 125.0000 ug | ORAL_TABLET | Freq: Every day | ORAL | 1 refills | Status: DC

## 2023-01-06 NOTE — Telephone Encounter (Signed)
Xarelto samples placed up front for pick up

## 2023-01-10 ENCOUNTER — Other Ambulatory Visit: Payer: Self-pay

## 2023-01-10 ENCOUNTER — Other Ambulatory Visit: Payer: Self-pay | Admitting: Medical

## 2023-01-10 MED ORDER — RIVAROXABAN 20 MG PO TABS
20.0000 mg | ORAL_TABLET | Freq: Every day | ORAL | 5 refills | Status: DC
  Filled 2023-01-10 – 2023-01-11 (×2): qty 30, 30d supply, fill #0

## 2023-01-10 NOTE — Telephone Encounter (Signed)
Prescription refill request for Xarelto received.  Indication:afib Last office visit:upcoming Weight:90.7  kg Age:57 Scr:1.1 CrCl:96.2 ml/min  Prescription refilled

## 2023-01-11 ENCOUNTER — Other Ambulatory Visit: Payer: Self-pay

## 2023-01-13 ENCOUNTER — Other Ambulatory Visit: Payer: Self-pay

## 2023-01-13 ENCOUNTER — Encounter: Payer: Self-pay | Admitting: Emergency Medicine

## 2023-01-13 ENCOUNTER — Emergency Department
Admission: EM | Admit: 2023-01-13 | Discharge: 2023-01-13 | Disposition: A | Discharge status: Home / Self Care | Payer: Medicaid Other | Attending: Emergency Medicine | Admitting: Emergency Medicine

## 2023-01-13 DIAGNOSIS — H6122 Impacted cerumen, left ear: Secondary | ICD-10-CM | POA: Insufficient documentation

## 2023-01-13 DIAGNOSIS — H9192 Unspecified hearing loss, left ear: Secondary | ICD-10-CM | POA: Diagnosis present

## 2023-01-13 MED ORDER — CARBAMIDE PEROXIDE 6.5 % OT SOLN
5.0000 [drp] | Freq: Once | OTIC | Status: AC
  Administered 2023-01-13: 5 [drp] via OTIC
  Filled 2023-01-13: qty 15

## 2023-01-13 NOTE — Telephone Encounter (Signed)
Patient picked up samples & dropped off PAF for Xarelto, gave to Bernette Mayers, RN

## 2023-01-13 NOTE — Telephone Encounter (Signed)
Called to see if patient still has insurance since he is no longer working. He denies having any insurance at this time because he is unemployed and filing for disability. Advised that I will have this completed once provider is back in office. He verbalized understanding

## 2023-01-13 NOTE — ED Provider Notes (Signed)
   [facility] Provider Note    Event Date/Time   First MD Initiated Contact with Patient 01/13/23 1144     (approximate)   History   Hearing Problem   HPI  Stanley Kane is a 57 y.o. male presents to the ED with complaint of decreased hearing in his left ear for several days.  Patient states that it was worse this morning when he got out of the shower.     Physical Exam   Triage Vital Signs: ED Triage Vitals [01/13/23 0959]  Enc Vitals Group     BP 118/86     Pulse Rate 90     Resp 16     Temp 98.2 F (36.8 C)     Temp Source Oral     SpO2 97 %     Weight 199 lb 15.3 oz (90.7 kg)     Height 5\' 7"  (1.702 m)     Head Circumference      Peak Flow      Pain Score 0     Pain Loc      Pain Edu?      Excl. in GC?     Most recent vital signs: Vitals:   01/13/23 0959  BP: 118/86  Pulse: 90  Resp: 16  Temp: 98.2 F (36.8 C)  SpO2: 97%     General: Awake, no distress.  CV:  Good peripheral perfusion.  Resp:  Normal effort.  Abd:  No distention.  Other:  Right EAC clear with TM being dull.  Left EAC is occluded with cerumen.  EM is not visible.   ED Results / Procedures / Treatments   Labs (all labs ordered are listed, but only abnormal results are displayed) Labs Reviewed - No data to display    PROCEDURES:  Critical Care performed:   Procedures   MEDICATIONS ORDERED IN ED: Medications  carbamide peroxide (DEBROX) 6.5 % OTIC (EAR) solution 5 drop (5 drops Left EAR Given 01/13/23 1202)     IMPRESSION / MDM / ASSESSMENT AND PLAN / ED COURSE  I reviewed the triage vital signs and the nursing notes.   Differential diagnosis includes, but is not limited to, cerumen impaction, otitis media, otitis externa, eustachian tube dysfunction.  58 year old male presents to the ED with complaint of decreased hearing in his left ear.  Patient reports he has used some over-the-counter medications without any relief.  Exam is consistent  with a cerumen impaction.  Debrox was applied in the ear canal and nursing staff lavage.  Patient eloped from the room prior to getting discharge instructions.      Patient's presentation is most consistent with acute, uncomplicated illness.  FINAL CLINICAL IMPRESSION(S) / ED DIAGNOSES   Final diagnoses:  Impacted cerumen of left ear     Rx / DC Orders   ED Discharge Orders     None        Note:  This document was prepared using Dragon voice recognition software and may include unintentional dictation errors.   Tommi Rumps, PA-C 01/13/23 1358    Minna Antis, MD 01/13/23 1434

## 2023-01-13 NOTE — Telephone Encounter (Signed)
Patient came into office with his assistance forms. He had completed his portion and just needs MD page to be done. Advised that I would work on getting that done. He verbalized understanding with no further questions at this time.   Will fax forms then file in out samples closet.

## 2023-01-13 NOTE — ED Notes (Signed)
Pt complains of hardly being able to hear out of his left ear. Pt states that he has been experiencing the sensation for about a week. The patient states that every time he takes a shower, water gets into his ear, and he is unable to hear. Pt also states that he feels like the fluid is draining  down into his neck. Patient has no complaints of pain at this time, and with no signs of acute distress at this time.

## 2023-01-13 NOTE — Telephone Encounter (Signed)
Pt assistance application placed in folder on my cart in file.

## 2023-01-13 NOTE — ED Triage Notes (Signed)
Pt here not able to hear out of his left ear from the shower. Pt not able to hear anything at the moment.

## 2023-01-16 ENCOUNTER — Telehealth: Payer: Self-pay

## 2023-01-16 NOTE — Telephone Encounter (Signed)
Transition Care Management Unsuccessful Follow-up Telephone Call  Date of discharge and from where:  01/13/24 [facility]  Attempts:  2nd Attempt  Reason for unsuccessful TCM follow-up call:  Left voice message  LVM at both home and cell numbers

## 2023-02-20 ENCOUNTER — Emergency Department: Payer: Medicaid Other

## 2023-02-20 ENCOUNTER — Emergency Department
Admission: EM | Admit: 2023-02-20 | Discharge: 2023-02-20 | Disposition: A | Discharge status: Home / Self Care | Payer: Medicaid Other | Attending: Emergency Medicine | Admitting: Emergency Medicine

## 2023-02-20 ENCOUNTER — Other Ambulatory Visit: Payer: Self-pay

## 2023-02-20 DIAGNOSIS — R109 Unspecified abdominal pain: Secondary | ICD-10-CM | POA: Diagnosis not present

## 2023-02-20 DIAGNOSIS — N132 Hydronephrosis with renal and ureteral calculous obstruction: Secondary | ICD-10-CM | POA: Diagnosis not present

## 2023-02-20 DIAGNOSIS — N200 Calculus of kidney: Secondary | ICD-10-CM | POA: Insufficient documentation

## 2023-02-20 DIAGNOSIS — R0689 Other abnormalities of breathing: Secondary | ICD-10-CM | POA: Diagnosis not present

## 2023-02-20 DIAGNOSIS — R1032 Left lower quadrant pain: Secondary | ICD-10-CM | POA: Diagnosis not present

## 2023-02-20 DIAGNOSIS — K8020 Calculus of gallbladder without cholecystitis without obstruction: Secondary | ICD-10-CM | POA: Diagnosis not present

## 2023-02-20 LAB — BASIC METABOLIC PANEL
Anion gap: 14 (ref 5–15)
BUN: 10 mg/dL (ref 6–20)
CO2: 16 mmol/L — ABNORMAL LOW (ref 22–32)
Calcium: 9.4 mg/dL (ref 8.9–10.3)
Chloride: 104 mmol/L (ref 98–111)
Creatinine, Ser: 1.18 mg/dL (ref 0.61–1.24)
GFR, Estimated: >60 mL/min (ref >60)
Glucose, Bld: 185 mg/dL — ABNORMAL HIGH (ref 70–99)
Potassium: 3.4 mmol/L — ABNORMAL LOW (ref 3.5–5.1)
Sodium: 134 mmol/L — ABNORMAL LOW (ref 135–145)

## 2023-02-20 LAB — CBC WITH DIFFERENTIAL/PLATELET
Abs Immature Granulocytes: 0.06 10*3/uL (ref 0.00–0.07)
Basophils Absolute: 0.1 10*3/uL (ref 0.0–0.1)
Basophils Relative: 1 %
Eosinophils Absolute: 0.3 10*3/uL (ref 0.0–0.5)
Eosinophils Relative: 3 %
HCT: 42.7 % (ref 39.0–52.0)
Hemoglobin: 15.3 g/dL (ref 13.0–17.0)
Immature Granulocytes: 1 %
Lymphocytes Relative: 25 %
Lymphs Abs: 2.8 10*3/uL (ref 0.7–4.0)
MCH: 31.9 pg (ref 26.0–34.0)
MCHC: 35.8 g/dL (ref 30.0–36.0)
MCV: 89.1 fL (ref 80.0–100.0)
Monocytes Absolute: 0.9 10*3/uL (ref 0.1–1.0)
Monocytes Relative: 8 %
Neutro Abs: 6.9 10*3/uL (ref 1.7–7.7)
Neutrophils Relative %: 62 %
Platelets: 416 10*3/uL — ABNORMAL HIGH (ref 150–400)
RBC: 4.79 MIL/uL (ref 4.22–5.81)
RDW: 12.4 % (ref 11.5–15.5)
WBC: 10.9 10*3/uL — ABNORMAL HIGH (ref 4.0–10.5)
nRBC: 0 % (ref 0.0–0.2)

## 2023-02-20 LAB — URINALYSIS, ROUTINE W REFLEX MICROSCOPIC
Bilirubin Urine: NEGATIVE
Glucose, UA: NEGATIVE mg/dL
Ketones, ur: 5 mg/dL — AB
Leukocytes,Ua: NEGATIVE
Nitrite: NEGATIVE
Protein, ur: NEGATIVE mg/dL
RBC / HPF: >50 RBC/hpf (ref 0–5)
Specific Gravity, Urine: 1.013 (ref 1.005–1.030)
pH: 7 (ref 5.0–8.0)

## 2023-02-20 MED ORDER — ONDANSETRON HCL 4 MG/2ML IJ SOLN: 4.0000 mg | Freq: Once | INTRAMUSCULAR | Status: DC

## 2023-02-20 MED ORDER — KETOROLAC TROMETHAMINE 30 MG/ML IJ SOLN
30.0000 mg | Freq: Once | INTRAMUSCULAR | Status: AC
  Administered 2023-02-20: 30 mg via INTRAVENOUS
  Filled 2023-02-20: qty 1

## 2023-02-20 MED ORDER — SODIUM CHLORIDE 0.9 % IV BOLUS
1000.0000 mL | Freq: Once | INTRAVENOUS | Status: AC
  Administered 2023-02-20: 1000 mL via INTRAVENOUS

## 2023-02-20 MED ORDER — MORPHINE SULFATE (PF) 4 MG/ML IV SOLN
4.0000 mg | Freq: Once | INTRAVENOUS | Status: AC
  Administered 2023-02-20: 4 mg via INTRAVENOUS
  Filled 2023-02-20: qty 1

## 2023-02-20 MED ORDER — ONDANSETRON 4 MG PO TBDP: 4.0000 mg | ORAL_TABLET | Freq: Three times a day (TID) | ORAL | 0 refills | Status: DC | PRN

## 2023-02-20 MED ORDER — TAMSULOSIN HCL 0.4 MG PO CAPS: 0.4000 mg | ORAL_CAPSULE | Freq: Every day | ORAL | 0 refills | Status: DC

## 2023-02-20 MED ORDER — TAMSULOSIN HCL 0.4 MG PO CAPS
0.4000 mg | ORAL_CAPSULE | Freq: Once | ORAL | Status: AC
  Administered 2023-02-20: 0.4 mg via ORAL
  Filled 2023-02-20: qty 1

## 2023-02-20 MED ORDER — OXYCODONE-ACETAMINOPHEN 5-325 MG PO TABS: 1.0000 | ORAL_TABLET | ORAL | 0 refills | Status: DC | PRN

## 2023-02-20 NOTE — Discharge Instructions (Addendum)
Follow-up Dr. Heywood Footman office.  Please call for an appointment.  Drink plenty of water.  Take medications as prescribed.  Return if worsening.

## 2023-02-20 NOTE — ED Provider Notes (Signed)
[facility] Provider Note    Event Date/Time   First MD Initiated Contact with Patient 02/20/23 1204     (approximate)   History   Flank Pain   HPI  Stanley Kane is a 57 y.o. male with history of A-fib, prediabetes, fatty liver presents emergency department with left-sided flank pain.  Patient states he is not ever had a kidney stone but there is family history of kidney stones.  States pain started 2 days ago and has worsened.  Is in the left upper back to the side.  No chest pain/shortness of breath but does feel like his heart is racing.  States history of A-fib.  No vomiting or diarrhea.  Patient did arrive via EMS and was given 100 mcg of fentanyl.  Patient is on Eliquis      Physical Exam   Triage Vital Signs: ED Triage Vitals  Enc Vitals Group     BP 02/20/23 1131 (!) 141/99     Pulse Rate 02/20/23 1131 (!) 105     Resp 02/20/23 1131 (!) 22     Temp 02/20/23 1131 98 F (36.7 C)     Temp Source 02/20/23 1131 Oral     SpO2 02/20/23 1131 98 %     Weight 02/20/23 1132 210 lb (95.3 kg)     Height 02/20/23 1132 5\' 8"  (1.727 m)     Head Circumference --      Peak Flow --      Pain Score 02/20/23 1131 10     Pain Loc --      Pain Edu? --      Excl. in GC? --     Most recent vital signs: Vitals:   02/20/23 1131  BP: (!) 141/99  Pulse: (!) 105  Resp: (!) 22  Temp: 98 F (36.7 C)  SpO2: 98%     General: Awake, no distress.   CV:  Good peripheral perfusion.  Tachycardic  resp:  Normal effort. Lungs cta Abd:  No distention.  Mild CVA tenderness on the left Other:  Lumbar paravertebral muscles are tender and spasm along the left side also, neurovascular is intact   ED Results / Procedures / Treatments   Labs (all labs ordered are listed, but only abnormal results are displayed) Labs Reviewed  CBC WITH DIFFERENTIAL/PLATELET - Abnormal; Notable for the following components:      Result Value   WBC 10.9 (*)    Platelets 416 (*)     All other components within normal limits  BASIC METABOLIC PANEL - Abnormal; Notable for the following components:   Sodium 134 (*)    Potassium 3.4 (*)    CO2 16 (*)    Glucose, Bld 185 (*)    All other components within normal limits  URINALYSIS, ROUTINE W REFLEX MICROSCOPIC - Abnormal; Notable for the following components:   Color, Urine YELLOW (*)    APPearance CLEAR (*)    Hgb urine dipstick MODERATE (*)    Ketones, ur 5 (*)    Bacteria, UA RARE (*)    All other components within normal limits     EKG  EKG   RADIOLOGY CT renal stone    PROCEDURES:   Procedures   MEDICATIONS ORDERED IN ED: Medications  sodium chloride 0.9 % bolus 1,000 mL (0 mLs Intravenous Stopped 02/20/23 1348)  morphine (PF) 4 MG/ML injection 4 mg (4 mg Intravenous Given 02/20/23 1241)  sodium chloride 0.9 % bolus 1,000 mL (  0 mLs Intravenous Stopped 02/20/23 1453)  tamsulosin (FLOMAX) capsule 0.4 mg (0.4 mg Oral Given 02/20/23 1403)  ketorolac (TORADOL) 30 MG/ML injection 30 mg (30 mg Intravenous Given 02/20/23 1453)     IMPRESSION / MDM / ASSESSMENT AND PLAN / ED COURSE  I reviewed the triage vital signs and the nursing notes.                              Differential diagnosis includes, but is not limited to, kidney stone, A-fib, herniated disc  Patient's presentation is most consistent with acute presentation with potential threat to life or bodily function.   Patient appears to be in a great deal of pain he is rocking back and forth.  Did order additional pain medication.  Morphine 4 mg IV.  Did not do additional Zofran as he has Zofran via EMS prior to arrival.   Patient had relief with the morphine.  CT renal stone study independently reviewed interpreted by me as having a proximal 2 to 4 mm kidney stone.  Radiologist comments no sign of infection  Did explain findings to the patient.  Consult to urology, Dr. Lonna Cobb says okay to give Toradol.  Follow-up as outpatient  I did  explain findings to patient.  Patient was given prescription for Percocet, Zofran ODT for nausea and vomiting if needed, and Flomax.  He was also given a strainer to urinate and to hopefully catch the stone.  Follow-up as outpatient.  Return emergency department if worsening.  Patient is in agreement treatment plan.  Discharged stable condition.  Patient was also given Toradol prior to discharge   FINAL CLINICAL IMPRESSION(S) / ED DIAGNOSES   Final diagnoses:  Kidney stone     Rx / DC Orders   ED Discharge Orders          Ordered    oxyCODONE-acetaminophen (PERCOCET) 5-325 MG tablet  Every 4 hours PRN        02/20/23 1449    ondansetron (ZOFRAN-ODT) 4 MG disintegrating tablet  Every 8 hours PRN        02/20/23 1449    tamsulosin (FLOMAX) 0.4 MG CAPS capsule  Daily        02/20/23 1449             Note:  This document was prepared using Dragon voice recognition software and may include unintentional dictation errors.    Faythe Ghee, PA-C 02/20/23 1455    Jene Every, MD 02/20/23 [phone]

## 2023-02-20 NOTE — ED Triage Notes (Signed)
Pt arrived via EMS from home d/t left side flank pain. Pt was given of fentanyl and 4mg  of zofran in route. Pt has hx afib and is on eliquis. Pt reports his pain starting this morning.

## 2023-02-20 NOTE — ED Notes (Signed)
Pt sister contact info [phone]

## 2023-02-21 ENCOUNTER — Emergency Department
Admission: EM | Admit: 2023-02-21 | Discharge: 2023-02-21 | Disposition: A | Discharge status: Home / Self Care | Payer: Medicaid Other | Attending: Emergency Medicine | Admitting: Emergency Medicine

## 2023-02-21 ENCOUNTER — Other Ambulatory Visit: Payer: Self-pay | Admitting: Family Medicine

## 2023-02-21 ENCOUNTER — Encounter: Payer: Self-pay | Admitting: Emergency Medicine

## 2023-02-21 DIAGNOSIS — R109 Unspecified abdominal pain: Secondary | ICD-10-CM | POA: Insufficient documentation

## 2023-02-21 DIAGNOSIS — R21 Rash and other nonspecific skin eruption: Secondary | ICD-10-CM

## 2023-02-21 DIAGNOSIS — E559 Vitamin D deficiency, unspecified: Secondary | ICD-10-CM

## 2023-02-21 LAB — URINALYSIS, ROUTINE W REFLEX MICROSCOPIC
Bilirubin Urine: NEGATIVE
Glucose, UA: NEGATIVE mg/dL
Ketones, ur: NEGATIVE mg/dL
Leukocytes,Ua: NEGATIVE
Nitrite: NEGATIVE
Protein, ur: NEGATIVE mg/dL
RBC / HPF: >50 RBC/hpf (ref 0–5)
Specific Gravity, Urine: 1.015 (ref 1.005–1.030)
pH: 5 (ref 5.0–8.0)

## 2023-02-21 LAB — BASIC METABOLIC PANEL
Anion gap: 9 (ref 5–15)
BUN: 10 mg/dL (ref 6–20)
CO2: 22 mmol/L (ref 22–32)
Calcium: 8.6 mg/dL — ABNORMAL LOW (ref 8.9–10.3)
Chloride: 105 mmol/L (ref 98–111)
Creatinine, Ser: 0.94 mg/dL (ref 0.61–1.24)
GFR, Estimated: >60 mL/min (ref >60)
Glucose, Bld: 154 mg/dL — ABNORMAL HIGH (ref 70–99)
Potassium: 3.6 mmol/L (ref 3.5–5.1)
Sodium: 136 mmol/L (ref 135–145)

## 2023-02-21 LAB — CBC
HCT: 38.1 % — ABNORMAL LOW (ref 39.0–52.0)
Hemoglobin: 13.1 g/dL (ref 13.0–17.0)
MCH: 32.4 pg (ref 26.0–34.0)
MCHC: 34.4 g/dL (ref 30.0–36.0)
MCV: 94.3 fL (ref 80.0–100.0)
Platelets: 313 10*3/uL (ref 150–400)
RBC: 4.04 MIL/uL — ABNORMAL LOW (ref 4.22–5.81)
RDW: 13.2 % (ref 11.5–15.5)
WBC: 10.4 10*3/uL (ref 4.0–10.5)
nRBC: 0 % (ref 0.0–0.2)

## 2023-02-21 MED ORDER — OXYCODONE-ACETAMINOPHEN 5-325 MG PO TABS: 1.0000 | ORAL_TABLET | Freq: Four times a day (QID) | ORAL | 0 refills | Status: DC | PRN

## 2023-02-21 NOTE — ED Provider Notes (Signed)
[facility] Provider Note    Event Date/Time   First MD Initiated Contact with Patient 02/21/23 1221     (approximate)   History   Flank Pain   HPI  Stanley Kane is a 57 y.o. male who was seen in our emergency department yesterday and diagnosed with a left-sided kidney stone comes in today because he had a brief episode of right-sided pain which is now resolved.  He also apparently accidentally dumped half of his pain medications into the sink while taking one of them.  No fevers no dysuria     Physical Exam   Triage Vital Signs: ED Triage Vitals  Enc Vitals Group     BP 02/21/23 1145 102/72     Pulse Rate 02/21/23 1145 85     Resp 02/21/23 1145 18     Temp 02/21/23 1145 98.1 F (36.7 C)     Temp Source 02/21/23 1145 Oral     SpO2 02/21/23 1145 98 %     Weight --      Height --      Head Circumference --      Peak Flow --      Pain Score 02/21/23 1147 1     Pain Loc --      Pain Edu? --      Excl. in GC? --     Most recent vital signs: Vitals:   02/21/23 1145  BP: 102/72  Pulse: 85  Resp: 18  Temp: 98.1 F (36.7 C)  SpO2: 98%     General: Awake, no distress.  CV:  Good peripheral perfusion.  Resp:  Normal effort.  Abd:  No distention.  Other:    ED Results / Procedures / Treatments   Labs (all labs ordered are listed, but only abnormal results are displayed) Labs Reviewed  URINALYSIS, ROUTINE W REFLEX MICROSCOPIC - Abnormal; Notable for the following components:      Result Value   Color, Urine YELLOW (*)    APPearance HAZY (*)    Hgb urine dipstick LARGE (*)    Bacteria, UA RARE (*)    All other components within normal limits  BASIC METABOLIC PANEL - Abnormal; Notable for the following components:   Glucose, Bld 154 (*)    Calcium 8.6 (*)    All other components within normal limits  CBC - Abnormal; Notable for the following components:   RBC 4.04 (*)    HCT 38.1 (*)    All other components within normal  limits     EKG    RADIOLOGY     PROCEDURES:  Critical Care performed:   Procedures   MEDICATIONS ORDERED IN ED: Medications - No data to display   IMPRESSION / MDM / ASSESSMENT AND PLAN / ED COURSE  I reviewed the triage vital signs and the nursing notes. Patient's presentation is most consistent with acute complicated illness / injury requiring diagnostic workup.  Patient well-appearing and in no acute distress, lab work improved from yesterday, no concern for infection  Reviewed CT scan from yesterday, no right-sided nephrolithiasis, will prescribe patient half of his pain medication prescription, no concern from drug database        FINAL CLINICAL IMPRESSION(S) / ED DIAGNOSES   Final diagnoses:  Flank pain     Rx / DC Orders   ED Discharge Orders          Ordered    oxyCODONE-acetaminophen (PERCOCET) 5-325 MG tablet  Every 6  hours PRN        02/21/23 1241             Note:  This document was prepared using Dragon voice recognition software and may include unintentional dictation errors.   Jene Every, MD 02/21/23 [phone]

## 2023-02-21 NOTE — ED Triage Notes (Signed)
Patient to ED via POV for right sided flank pain. States he was seen yesterday and dx with kidney stone on left side. States he was given pain medication prescription but over half fell in the sink with water. Has appointment on Friday.

## 2023-02-21 NOTE — Telephone Encounter (Signed)
Kenalog crm Last rx:  12/27/22, #453.6 g Last OV:  12/27/21, CPE Next OV:  03/15/23, CPE

## 2023-02-21 NOTE — Telephone Encounter (Signed)
Prescription Request  02/21/2023  LOV: Visit date not found  What is the name of the medication or equipment? triamcinolone cream (KENALOG) 0.1 %   Have you contacted your pharmacy to request a refill? No   Which pharmacy would you like this sent to?  [facility] - [place], [place] - [address] [place] [address] Phone: [phone] Fax: [phone]     Patient notified that their request is being sent to the clinical staff for review and that they should receive a response within 2 business days.   Please advise at Mobile [phone] (mobile)  Pt also stated [facility] cannot see him until next year, due to being short staffed with their providers. Pt states he was told they aren't accepting any new pts. Pt states his insurance changed to medicaid, so he needs a referral placed to someone in network.

## 2023-02-21 NOTE — Addendum Note (Signed)
Addended by: Nanci Pina on: 02/21/2023 04:53 PM   Modules accepted: Orders

## 2023-02-23 DIAGNOSIS — E559 Vitamin D deficiency, unspecified: Secondary | ICD-10-CM | POA: Insufficient documentation

## 2023-02-23 MED ORDER — TRIAMCINOLONE ACETONIDE 0.1 % EX CREA
TOPICAL_CREAM | CUTANEOUS | 0 refills | Status: DC
Start: 2023-02-23 — End: 2024-07-12

## 2023-02-23 NOTE — Addendum Note (Signed)
Addended by: [name] on: 02/23/2023 12:51 PM   Modules accepted: Orders

## 2023-02-23 NOTE — Telephone Encounter (Addendum)
New dermatology referral placed. Multiple missed appts with our office and dermatology in the past year.  Will not refill jar at this time given concern for overuse of topical steroid, recommend review use at appt next month. I have refilled 80g tube to last until upcoming appt.

## 2023-02-23 NOTE — Telephone Encounter (Signed)
Spoke with pt relaying Dr. G's message. Pt verbalizes understanding.  

## 2023-02-24 ENCOUNTER — Telehealth: Payer: Self-pay

## 2023-02-24 ENCOUNTER — Ambulatory Visit
Admission: RE | Admit: 2023-02-24 | Discharge: 2023-02-24 | Disposition: A | Discharge status: Home / Self Care | Payer: Medicaid Other | Source: Ambulatory Visit | Attending: Urology | Admitting: Urology

## 2023-02-24 ENCOUNTER — Encounter: Payer: Self-pay | Admitting: Urology

## 2023-02-24 ENCOUNTER — Ambulatory Visit (INDEPENDENT_AMBULATORY_CARE_PROVIDER_SITE_OTHER): Payer: Medicaid Other | Admitting: Urology

## 2023-02-24 VITALS — BP 123/74 | HR 112 | Ht 69.0 in | Wt 229.0 lb

## 2023-02-24 DIAGNOSIS — N201 Calculus of ureter: Secondary | ICD-10-CM | POA: Diagnosis not present

## 2023-02-24 DIAGNOSIS — N200 Calculus of kidney: Secondary | ICD-10-CM

## 2023-02-24 MED ORDER — OXYCODONE-ACETAMINOPHEN 5-325 MG PO TABS: 1.0000 | ORAL_TABLET | Freq: Four times a day (QID) | ORAL | 0 refills | Status: AC | PRN

## 2023-02-24 NOTE — Progress Notes (Unsigned)
I, Maysun L Gibbs,acting as a scribe for Riki Altes, MD.,have documented all relevant documentation on the behalf of Riki Altes, MD,as directed by  Riki Altes, MD while in the presence of Riki Altes, MD.  02/24/2023 12:12 PM   YUKIO LEWARK 10/26/1965 [id number]  Referring provider: [name], MD [address] [place],  [place] [address]  Chief Complaint  Patient presents with   Nephrolithiasis    HPI: Stanley Kane is a 57 y.o. male presents for follow-up of recent ED visit for renal colic.  Presented to [facility] 02/20/23 with a 2 day history of left flank pain, which significantly worsened on the day of his visit. Urinalysis showed >50 RBC and a CT renal stone study showed a 3 x 4 mm left proximal ureteral calculus with mild hydronephrosis. No additional urinary calculi were seen however, hyperdensities in the papillary lesions identified indicating the possibility of medullary sponge kidney. He was treated with IV morphine, ketorolac with improvement in his pain and discharged on oxycodone, Zofran and tamsulosin.  He returned the following day with a brief episode of flank pain, which had resolved but he had accidentally dumped half of his pain medication down the sink. His oxycodone was refilled. Since ED visit, he has not had severe pain. However, he is taking the oxycodone regularly.  No fever, chills, nausea, or vomiting.  No previous history of stone disease.   PMH: Past Medical History:  Diagnosis Date   Anxiety    Chest pain    a. 09/2016 Cor Ca2+ scoring: 0; b. 08/2017 MV: No isch/infarct. EF 33-44%; c. 12/2019 MV: No isch/infarct. EF 46%; d. 11/2021 MV: No isch/infarct. Nl LVEF. No significant Ao/Cor Ca2+.   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 09/2014 Echo: EF  60-65%, mild MR/TR; b. 10/2017 Echo: EF 45-50%, no rwma, mildly dil LA, nl RV fxn.   Obesity    OSA (obstructive sleep apnea)    Persistent atrial fibrillation (HCC)    a. dx 2013 s/p DCCV; b. recurrent Afib 2016 s/p DCCV 11/25/2014; c. 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   hospitalization [facility] - stress with father's illness    Surgical History: Past Surgical History:  Procedure Laterality Date   CARDIOVASCULAR STRESS TEST  ~2005   thinks at [facility], told normal   carotid US  08/2014   no plaque   COLONOSCOPY  2000   WNL, for weight loss after started gym   MRI  08/2014   brain - WNL, skull base C1/2 articulation   TONSILLECTOMY AND ADENOIDECTOMY  childhood   US ECHOCARDIOGRAPHY  08/2014   EF 60%, nl vent fxn, mildly dilated LA, mild MR, mild TR    Home Medications:  Allergies as of 02/24/2023       Reactions   Vicodin [hydrocodone-acetaminophen]    Makes him "jittery"        Medication List        Accurate as of February 24, 2023 12:12 PM. If you have any questions, ask your nurse or doctor.          digoxin 0.125 MG tablet Commonly known as: LANOXIN Take 1 tablet (125 mcg total) by mouth daily. NEED APPOINTMENT BEFORE  ANY FURTHER REFILLS   digoxin 0.125 MG tablet Commonly known as: LANOXIN Take 1 tablet (125 mcg total) by mouth daily. * NEED TO SCHEDULE AN OFFICE VISIT FOR FURTHER REFILLS*   furosemide 20 MG tablet Commonly known as: LASIX Take 1 tablet (20 mg total) by mouth as needed for edema.   metoprolol succinate 25 MG 24 hr tablet Commonly known as: TOPROL-XL Take 1 tablet (25 mg total) by mouth daily. NEED APPOINTMENT BEFORE ANY FURTHER REFILLS   metoprolol succinate 25 MG 24 hr tablet Commonly known as: TOPROL-XL Take 1 tablet (25 mg total) by mouth daily. *NEED TO SCHEDULE AN OFFICE VISIT FOR FURTHER REFILLS*   Multivitamin Adult Tabs Take 1 tablet by mouth daily.   ondansetron 4 MG disintegrating tablet Commonly  known as: ZOFRAN-ODT Take 1 tablet (4 mg total) by mouth every 8 (eight) hours as needed.   oxyCODONE-acetaminophen 5-325 MG tablet Commonly known as: Percocet Take 1 tablet by mouth every 6 (six) hours as needed for up to 5 days for severe pain.   PARoxetine 40 MG tablet Commonly known as: PAXIL TAKE 1 TABLET BY MOUTH EVERY MORNING   PARoxetine 40 MG tablet Commonly known as: PAXIL Take 1 tablet (40 mg total) by mouth every morning.   rivaroxaban 20 MG Tabs tablet Commonly known as: Xarelto Take 1 tablet (20 mg total) by mouth daily with supper.   rivaroxaban 20 MG Tabs tablet Commonly known as: XARELTO Take 1 tablet (20 mg total) by mouth daily with supper.   tamsulosin 0.4 MG Caps capsule Commonly known as: FLOMAX Take 1 capsule (0.4 mg total) by mouth daily.   triamcinolone cream 0.1 % Commonly known as: KENALOG APPLY TO AFFECTED AREA TWICE A DAY AS DIRECTED        Allergies:  Allergies  Allergen Reactions   Vicodin [Hydrocodone-Acetaminophen]     Makes him "jittery"    Family History: Family History  Problem Relation Age of Onset   Hypertension Father    Coronary artery disease Father 62   Heart attack Father    Cirrhosis Father    Hypercalcemia Father    Diabetes Mother    Heart disease Mother    CAD Sister 59       deceased from MI   GI Disease Sister    Stroke Neg Hx     Social History:  reports that he quit smoking about 26 years ago. His smoking use included cigarettes. He has never used smokeless tobacco. He reports that he does not currently use alcohol after a past usage of about 2.0 standard drinks of alcohol per week. He reports current drug use. Drug: Cocaine.   Physical Exam: BP 123/74 (BP Location: Left Arm, Patient Position: Sitting, Cuff Size: Large)   Pulse (!) 112   Ht 5\' 9"  (1.753 m)   Wt 229 lb (103.9 kg)   BMI 33.82 kg/m   Constitutional:  Alert and oriented, No acute distress. HEENT: [place] AT, moist mucus membranes.  Trachea  midline, no masses. Cardiovascular: No clubbing, cyanosis, or edema. Respiratory: Normal respiratory effort, no increased work of breathing. GI: Abdomen is soft, nontender, nondistended, no abdominal masses Skin: No rashes, bruises or suspicious lesions. Neurologic: Grossly intact, no focal deficits, moving all 4 extremities. Psychiatric: Normal mood and affect.   Pertinent Imaging: CT images were personally being interpreted. On my review the calculus measures 3 x 6 mm with a density of approximately 500 HU. Punctate calcification overlying the right renal outline.  CT Renal Stone Study  Narrative CLINICAL DATA:  Abdominal and flank pain, stone suspected. Left-sided symptoms.  EXAM: CT ABDOMEN AND PELVIS WITHOUT CONTRAST  TECHNIQUE: Multidetector CT imaging of the abdomen and pelvis was performed following the standard protocol without IV contrast.  RADIATION DOSE REDUCTION: This exam was performed according to the departmental dose-optimization program which includes automated exposure control, adjustment of the mA and/or kV according to patient size and/or use of iterative reconstruction technique.  COMPARISON:  05/30/2016  FINDINGS: Lower chest: No active cardiopulmonary finding. 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  Hepatobiliary: Liver parenchyma is normal without contrast. Multiple calcified gallstones present within the gallbladder. No CT evidence of cholecystitis.  Pancreas: Normal  Spleen: Normal  Adrenals/Urinary Tract: Adrenal glands are normal. No definite stone on the right. Slight hyperdensity of the papillary regions could indicate medullary sponge kidney. Left kidney is mildly swollen with mild hydroureteronephrosis. There is a 3 x 4 mm stone at the proximal ureter. Slight renal papillary hyperdensity on this side as well. No stone in the more distal ureter or in the bladder.  Stomach/Bowel: Stomach and  small intestine are normal. No abnormal colon finding.  Vascular/Lymphatic: Aorta and IVC are normal.  No adenopathy.  Reproductive: Normal  Other: No free fluid or air.  Musculoskeletal: Normal  IMPRESSION: 1. 3 x 4 mm stone in the proximal left ureter with mild hydroureteronephrosis. 2. Slight hyperdensity of the renal papillary regions bilaterally, which could indicate medullary sponge kidney. 3. Cholelithiasis without evidence of cholecystitis.   Electronically Signed By: Paulina Fusi M.D. On: 02/20/2023 13:23   Assessment & Plan:    1. Left proximal ureteral calculus We discussed various treatment options for urolithiasis including observation with or without medical expulsive therapy, shockwave lithotripsy (SWL), ureteroscopy and laser lithotripsy with stent placement, and percutaneous nephrolithotomy. We discussed that management is based on stone size, location, density, patient co-morbidities, and patient preference.  Stones <93mm in size have a >80% spontaneous passage rate. Data surrounding the use of tamsulosin for medical expulsive therapy is controversial, but meta analyses suggests it is most efficacious for distal stones between 5-16mm in size. Possible side effects include dizziness/lightheadedness, and retrograde ejaculation. SWL has a lower stone free rate in a single procedure, but also a lower complication rate compared to ureteroscopy and avoids a stent and associated stent related symptoms. Possible complications include renal hematoma, steinstrasse, and need for additional treatment. Ureteroscopy with laser lithotripsy and stent placement has a higher stone free rate than SWL in a single procedure, however increased complication rate including possible infection, ureteral injury, bleeding, and stent related morbidity. Common stent related symptoms include dysuria, urgency/frequency, and flank pain. PCNL is the favored treatment for stones >2cm. It involves a  small incision in the flank, with complete fragmentation of stones and removal. It has the highest stone free rate, but also the highest complication rate. Possible complications include bleeding, infection/sepsis, injury to surrounding organs including the pleura, and collecting system injury.  After an extensive discussion of the risks and benefits of the above treatment options, the patient would like to proceed with SWL if calculus visualized on plain x-ray.  KUB was ordered and he will be notified with the results.  Oxycodone refilled.  I have reviewed the above documentation for accuracy and completeness, and I agree with the above.   Riki Altes, MD  [facility] [address], [address] [place], [place] [address] [phone]

## 2023-02-24 NOTE — Telephone Encounter (Signed)
VM left on triage line from [facility] with [facility], she states that they are concerned about the RX sent in today from our office for Percocet as the patient has had 2 prescriptions filled already this week for Percocet. It appears that the RX sent in on 02/21/23 was due to the patient losing half of his previous prescription.   Called [facility] back, no answer. Left detailed message informing them that Dr. Lonna Cobb is out of the office for the remainder of the day and will not be back in office until next week, however per Vivi Barrack, PA ok to fill RX as patient still has kidney stone based on today's imaging. Advised pharmacy to call back for questions or concerns.

## 2023-02-28 ENCOUNTER — Telehealth: Payer: Self-pay | Admitting: *Deleted

## 2023-02-28 NOTE — Telephone Encounter (Signed)
Notified patient as instructed, He would like SWL done.

## 2023-02-28 NOTE — Telephone Encounter (Signed)
-----   Message from Riki Altes, MD sent at 02/28/2023  1:19 PM EDT ----- Regarding: ?  SWL Stone was visualized on KUB and has not progressed when compared with CT.  Does he desire to schedule SWL

## 2023-02-28 NOTE — Telephone Encounter (Signed)
If you will send me orders I will get him scheduled

## 2023-03-01 ENCOUNTER — Other Ambulatory Visit: Payer: Self-pay | Admitting: Urology

## 2023-03-01 DIAGNOSIS — N201 Calculus of ureter: Secondary | ICD-10-CM

## 2023-03-01 NOTE — Progress Notes (Unsigned)
ES[facility] ORDER FORM  Expected date of procedure: Patient preference   Surgeon: Scheduled  Post op standing: 2-4wk follow up w/KUB prior  Anticoagulation/Aspirin/NSAID standing order: Hold all 72 hours prior  Anesthesia standing order: MAC  VTE standing: SCD's  Dx: Left Ureteral Stone  Procedure: left Extracorporeal shock wave lithotripsy  CPT : 50590  Standing Order Set:   *NPO after mn, KUB  *NS 160ml/hr, Keflex 500mg  PO, Benadryl 25mg  PO, Valium 10mg  PO, Zofran 4mg  IV    Medications if other than standing orders:   NONE  On Xarelto-cardiac clearance UDS a.m. of procedure-let patient know we will be unable to do procedure if UDS positive

## 2023-03-02 ENCOUNTER — Other Ambulatory Visit: Payer: Self-pay | Admitting: Family Medicine

## 2023-03-02 DIAGNOSIS — E785 Hyperlipidemia, unspecified: Secondary | ICD-10-CM

## 2023-03-02 DIAGNOSIS — Z1159 Encounter for screening for other viral diseases: Secondary | ICD-10-CM

## 2023-03-02 DIAGNOSIS — R7303 Prediabetes: Secondary | ICD-10-CM

## 2023-03-02 DIAGNOSIS — E559 Vitamin D deficiency, unspecified: Secondary | ICD-10-CM

## 2023-03-02 DIAGNOSIS — I4821 Permanent atrial fibrillation: Secondary | ICD-10-CM

## 2023-03-02 DIAGNOSIS — K760 Fatty (change of) liver, not elsewhere classified: Secondary | ICD-10-CM

## 2023-03-02 DIAGNOSIS — Z125 Encounter for screening for malignant neoplasm of prostate: Secondary | ICD-10-CM

## 2023-03-03 ENCOUNTER — Telehealth: Payer: Self-pay | Admitting: *Deleted

## 2023-03-03 NOTE — Telephone Encounter (Signed)
   Name: Stanley Kane  DOB: 01/25/1966  MRN: [id number]  Primary Cardiologist: Julien Nordmann, MD  Chart reviewed as part of pre-operative protocol coverage. Because of [name] past medical history and time since last visit, he will require a follow-up in-office visit in order to better assess preoperative cardiovascular risk.  Patient is scheduled with Dr. Mariah Milling on 03/24/2023. Appointment notes have been updated to reflect need for preoperative evaluation.   Pre-op covering staff:  - Please contact requesting surgeon's office via preferred method (i.e, phone, fax) to inform them of need for appointment prior to surgery.  This message will also be routed to pharmacy pool for input on holding Xarelto as requested below so that this information is available to the clearing provider at time of patient's appointment.   Carlos Levering, NP  03/03/2023, 4:00 PM

## 2023-03-03 NOTE — Progress Notes (Addendum)
  Phone Number: [phone] for Surgical Coordinator Fax Number: [phone]  REQUEST FOR SURGICAL CLEARANCE       Date: Date: 04/19/2023  Faxed to: Dr. Mariah Milling, MD  Surgeon: Dr. Irineo Axon, MD     Date of Surgery: 05/11/2023  Operation: Left Extracorporeal Shock Wave Lithotripsy  Anesthesia Type: MOD  Diagnosis: Left Ureteral Stone  Patient Requires:   Cardiac / Vascular Clearance : Yes  Reason: Will need to hold Xarelto for 72 hours prior to procedure   Risk Assessment:    Low   []       Moderate   []     High   []           This patient is optimized for surgery  YES []       NO   []    I recommend further assessment/workup prior to surgery. YES []      NO  []   Appointment scheduled for: _______________________   Further recommendations: ____________________________________     Physician Signature:__________________________________   Printed Name: ________________________________________   Date: _________________

## 2023-03-03 NOTE — Telephone Encounter (Signed)
Left message for the pt to call back to schedule a sooner appt as he needs pre op clearance now.

## 2023-03-03 NOTE — Telephone Encounter (Signed)
   Pre-operative Risk Assessment    Patient Name: Stanley Kane  DOB: Mar 29, 1966 MRN: [id number]      Request for Surgical Clearance    Procedure: LEFT EXTRACORPOREAL SHOCK WAVE LITHOTRIPSY    Date of Surgery:  Clearance 03/09/23                                 Surgeon:  DR. Lorin Picket STOIOFF Surgeon's Group or Practice Name:  [facility] [place] Phone number:  [phone] ATTN: Letta Kocher, CMA Fax number:  [phone]   Type of Clearance Requested:   - Medical  - Pharmacy:  Hold Rivaroxaban (Xarelto) x 72 HOURS PRIOR TO PROCEDURE   Type of Anesthesia:   MOD   Additional requests/questions:    Elpidio Anis   03/03/2023, 9:22 AM

## 2023-03-03 NOTE — Telephone Encounter (Signed)
Please advise holding Xarelto prior to left extracorporeal shockwave lithotripsy scheduled for 03/09/2023.   Thank you!  DW

## 2023-03-03 NOTE — Telephone Encounter (Signed)
Patient with diagnosis of afib on Xarelto for anticoagulation.    Procedure: left extracorporeal shockwave lithotripsy  Date of procedure: 03/09/23  CHA2DS2-VASc Score = 1  This indicates a 0.6% annual risk of stroke. The patient's score is based upon: CHF History: 1 HTN History: 0 Diabetes History: 0 Stroke History: 0 Vascular Disease History: 0 Age Score: 0 Gender Score: 0      CrCl >120mL/min Platelet count 313K  Pt has not been seen by [facility] in over a year. Requires follow up visit. If no clinical changes are noted at that time, ok for pt to hold Xarelto for 3 days prior to procedure as requested.  **This guidance is not considered finalized until pre-operative APP has relayed final recommendations.**

## 2023-03-06 ENCOUNTER — Telehealth: Payer: Self-pay | Admitting: Cardiovascular Disease

## 2023-03-06 NOTE — Telephone Encounter (Signed)
Pt returning call from 7/5 regarding sooner appt. Pt states that office doing procedure would like to do this week. Please advise

## 2023-03-06 NOTE — Telephone Encounter (Addendum)
Spoke with patient who aware in face appointment may not be available before 03-09-23 and stated he can rescheduled just let him know by Wednesday so that they can let them know also.

## 2023-03-07 NOTE — Telephone Encounter (Signed)
Pt has appt with [name], [facility] 03/08/23. I will update all parties involved of the appt.

## 2023-03-08 ENCOUNTER — Ambulatory Visit: Payer: Medicaid Other | Admitting: Medical

## 2023-03-08 ENCOUNTER — Telehealth: Payer: Self-pay | Admitting: Urology

## 2023-03-08 ENCOUNTER — Other Ambulatory Visit: Payer: Medicaid Other

## 2023-03-08 ENCOUNTER — Ambulatory Visit: Payer: Medicaid Other

## 2023-03-08 DIAGNOSIS — I4821 Permanent atrial fibrillation: Secondary | ICD-10-CM | POA: Diagnosis not present

## 2023-03-08 DIAGNOSIS — Z125 Encounter for screening for malignant neoplasm of prostate: Secondary | ICD-10-CM | POA: Diagnosis not present

## 2023-03-08 DIAGNOSIS — E559 Vitamin D deficiency, unspecified: Secondary | ICD-10-CM

## 2023-03-08 DIAGNOSIS — Z1159 Encounter for screening for other viral diseases: Secondary | ICD-10-CM | POA: Diagnosis not present

## 2023-03-08 DIAGNOSIS — K760 Fatty (change of) liver, not elsewhere classified: Secondary | ICD-10-CM

## 2023-03-08 DIAGNOSIS — R7303 Prediabetes: Secondary | ICD-10-CM

## 2023-03-08 DIAGNOSIS — E785 Hyperlipidemia, unspecified: Secondary | ICD-10-CM

## 2023-03-08 LAB — LIPID PANEL
Cholesterol: 169 mg/dL (ref 0–200)
HDL: 40.8 mg/dL (ref >39.00)
Total CHOL/HDL Ratio: 4
Triglycerides: 425 mg/dL — ABNORMAL HIGH (ref 0.0–149.0)

## 2023-03-08 LAB — COMPREHENSIVE METABOLIC PANEL
ALT: 30 U/L (ref 0–53)
AST: 28 U/L (ref 0–37)
Albumin: 4.2 g/dL (ref 3.5–5.2)
Alkaline Phosphatase: 49 U/L (ref 39–117)
BUN: 12 mg/dL (ref 6–23)
CO2: 30 mEq/L (ref 19–32)
Calcium: 9.4 mg/dL (ref 8.4–10.5)
Chloride: 98 mEq/L (ref 96–112)
Creatinine, Ser: 0.96 mg/dL (ref 0.40–1.50)
GFR: 88.43 mL/min (ref >60.00)
Glucose, Bld: 110 mg/dL — ABNORMAL HIGH (ref 70–99)
Potassium: 4 mEq/L (ref 3.5–5.1)
Sodium: 138 mEq/L (ref 135–145)
Total Bilirubin: 0.6 mg/dL (ref 0.2–1.2)
Total Protein: 7.2 g/dL (ref 6.0–8.3)

## 2023-03-08 LAB — PSA: PSA: 0.9 ng/mL (ref 0.10–4.00)

## 2023-03-08 LAB — VITAMIN D 25 HYDROXY (VIT D DEFICIENCY, FRACTURES): VITD: 25.84 ng/mL — ABNORMAL LOW (ref 30.00–100.00)

## 2023-03-08 LAB — TSH: TSH: 2.28 u[IU]/mL (ref 0.35–5.50)

## 2023-03-08 LAB — HEMOGLOBIN A1C: Hgb A1c MFr Bld: 5.9 % (ref 4.6–6.5)

## 2023-03-08 LAB — LDL CHOLESTEROL, DIRECT: Direct LDL: 89 mg/dL

## 2023-03-08 NOTE — Telephone Encounter (Signed)
I will update the surgeon office the pt did not come for his appt today with Gillian Shields, NP for pre op clearance. Pt will need to call back and reschedule. Surgery will need to be post poned until pt has been seen by cardiology and has been cleared.

## 2023-03-08 NOTE — Progress Notes (Deleted)
Cardiology Office Note:    Date:  03/08/2023   ID:  Stanley Kane, DOB 1965/12/23, MRN [id number]  PCP:  [name], MD  [facility] Cardiologist:  Julien Nordmann, MD  [facility] Electrophysiologist:  Lanier Prude, MD   Referring MD: [name], MD   Chief Complaint: ***  History of Present Illness:    Stanley Kane is a 57 y.o. male with a hx of permanent A. fib diagnosed in 2013 status post DCCV in 06/2012 with recurrent A. fib status post repeat DCCV in 10/2014 with subsequent recurrence on Xarelto, dilated cardiomyopathy, mild mitral regurgitation, remote cocaine use, and obesity who presents for follow-up of his cardiomyopathy and A. fib.   Prior echo from 08/2014 showed an EF of 60-65%, mild MR, mild TR, normal RVSF and PASP.  He was noted to be back in Afib in 2016 in the setting of medication noncompliance. Following resumption of medications, he underwent briefly successful DCCV on 11/25/2014. He was again noted to be back in Afib in 12/2014 and has remained in Afib in subsequent office visits leading to the discontinuation of flecainide and rate control strategy in 09/2016. He underwent calcium scoring in 09/2016 given atypical chest pain with a score of 0.   He underwent nuclear stress testing, in 08/2017, to evaluate for high risk ischemia (with plans to consider rhythm control strategy) that was negative for evidence of ischemia or scar. EF was reduced, felt to be underestimated given Afib. We followed this up with an echo in 10/2017 that showed an EF of 45-50%, no RWMA, mildly dilated left atrium measuring 42 mm, RVSF and PASP were normal.  He was seen in 11/2019 needing annual follow-up for DOT clearance.  He was doing well from a cardiac perspective.  He was noted to not be taking Xarelto due to financial issues.  She underwent Lexiscan MPI in 12/2019 which was a low risk and without evidence of ischemia.  LVEF was mildly decreased at 46%, though visually appeared  to be normal.  Echo was recommended to confirm EF, though remains pending.  He was last seen in the office in 12/2019 and was doing well from a cardiac perspective.  He was noted to be tachycardic with a rate of 125, though reported he was in a hurry and had not yet taken his medications that day.  He was cleared for DOT driving.    Past Medical History:  Diagnosis Date   Anxiety    Chest pain    a. 09/2016 Cor Ca2+ scoring: 0; b. 08/2017 MV: No isch/infarct. EF 33-44%; c. 12/2019 MV: No isch/infarct. EF 46%; d. 11/2021 MV: No isch/infarct. Nl LVEF. No significant Ao/Cor Ca2+.   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 09/2014 Echo: EF 60-65%, mild MR/TR; b. 10/2017 Echo: EF 45-50%, no rwma, mildly dil LA, nl RV fxn.   Obesity    OSA (obstructive sleep apnea)    Persistent atrial fibrillation (HCC)    a. dx 2013 s/p DCCV; b. recurrent Afib 2016 s/p DCCV 11/25/2014; c. 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   hospitalization [facility] - stress with father's illness    Past Surgical History:  Procedure Laterality Date   CARDIOVASCULAR STRESS TEST  ~2005  thinks at [facility], told normal   carotid US  08/2014   no plaque   COLONOSCOPY  2000   WNL, for weight loss after started gym   MRI  08/2014   brain - WNL, skull base C1/2 articulation   TONSILLECTOMY AND ADENOIDECTOMY  childhood   US ECHOCARDIOGRAPHY  08/2014   EF 60%, nl vent fxn, mildly dilated LA, mild MR, mild TR    Current Medications: No outpatient medications have been marked as taking for the 03/08/23 encounter (Appointment) with Fransico Michael, Raegen Tarpley H, PA-C.     Allergies:   Vicodin [hydrocodone-acetaminophen]   Social History   Socioeconomic History   Marital status: Divorced    Spouse name: Not on file   Number of children: Not on  file   Years of education: Not on file   Highest education level: Not on file  Occupational History   Not on file  Tobacco Use   Smoking status: Former    Types: Cigarettes    Quit date: 08/29/1996    Years since quitting: 26.5   Smokeless tobacco: Never  Vaping Use   Vaping Use: Never used  Substance and Sexual Activity   Alcohol use: Not Currently    Alcohol/week: 2.0 standard drinks of alcohol    Types: 2 Cans of beer per week    Comment: quit beginning of 2020   Drug use: Yes    Types: Cocaine   Sexual activity: Yes  Other Topics Concern   Not on file  Social History Narrative   Occupation: truck driver, just started back   Edu: 9th grade, GED   Social Determinants of Health   Financial Resource Strain: Not on file  Food Insecurity: Not on file  Transportation Needs: Not on file  Physical Activity: Inactive (04/18/2019)   Exercise Vital Sign    Days of Exercise per Week: 0 days    Minutes of Exercise per Session: 0 min  Stress: Not on file  Social Connections: Not on file     Family History: The patient's ***family history includes CAD (age of onset: 43) in his sister; Cirrhosis in his father; Coronary artery disease (age of onset: 72) in his father; Diabetes in his mother; GI Disease in his sister; Heart attack in his father; Heart disease in his mother; Hypercalcemia in his father; Hypertension in his father. There is no history of Stroke.  ROS:   Please see the history of present illness.    *** All other systems reviewed and are negative.  EKGs/Labs/Other Studies Reviewed:    The following studies were reviewed today: ***  EKG:  EKG is *** ordered today.  The ekg ordered today demonstrates ***  Recent Labs: 10/12/2022: ALT 41; B Natriuretic Peptide 162.2 02/21/2023: BUN 10; Creatinine, Ser 0.94; Hemoglobin 13.1; Platelets 313; Potassium 3.6; Sodium 136  Recent Lipid Panel    Component Value Date/Time   CHOL 222 (H) 12/27/2021 1218   CHOL 157 09/25/2014  1731   CHOL 175 10/10/2011 0000   TRIG 206.0 (H) 12/27/2021 1218   TRIG 250 (H) 09/25/2014 1731   TRIG 93 10/10/2011 0000   HDL 43.70 12/27/2021 1218   HDL 33 (L) 09/25/2014 1731   CHOLHDL 5 12/27/2021 1218   VLDL 41.2 (H) 12/27/2021 1218   VLDL 50 (H) 09/25/2014 1731   LDLCALC 103 (H) 09/08/2015 1036   LDLCALC 74 09/25/2014 1731   LDLDIRECT 149.0 12/27/2021 1218     Risk Assessment/Calculations:   {Does this patient have ATRIAL FIBRILLATION?:[id number]}  Physical Exam:    VS:  There were no vitals taken for this visit.    Wt Readings from Last 3 Encounters:  02/24/23 229 lb (103.9 kg)  02/20/23 210 lb (95.3 kg)  01/13/23 199 lb 15.3 oz (90.7 kg)     GEN: *** Well nourished, well developed in no acute distress HEENT: Normal NECK: No JVD; No carotid bruits LYMPHATICS: No lymphadenopathy CARDIAC: ***RRR, no murmurs, rubs, gallops RESPIRATORY:  Clear to auscultation without rales, wheezing or rhonchi  ABDOMEN: Soft, non-tender, non-distended MUSCULOSKELETAL:  No edema; No deformity  SKIN: Warm and dry NEUROLOGIC:  Alert and oriented x 3 PSYCHIATRIC:  Normal affect   ASSESSMENT:    No diagnosis found. PLAN:    In order of problems listed above:  ***  Disposition: Follow up {follow up:15908} with ***   Shared Decision Making/Informed Consent   {Are you ordering a CV Procedure (e.g. stress test, cath, DCCV, TEE, etc)?   Press F2        :[id number]}    Signed, Kristina Mcnorton David Stall, PA-C  03/08/2023 8:45 AM    [facility]

## 2023-03-08 NOTE — Addendum Note (Signed)
Addended by: Alvina Chou on: 03/08/2023 03:02 PM   Modules accepted: Orders

## 2023-03-08 NOTE — Telephone Encounter (Signed)
Pt stopped by office this afternoon.  He has litho scheduled for next Thursday 7/18.  He wanted to know if he could get some pain meds to last until then.  If so, can you send something in to [facility].  He's O.K. right now, but wants to have them just in case.

## 2023-03-09 ENCOUNTER — Ambulatory Visit: Payer: Medicaid Other | Admitting: Medical

## 2023-03-09 LAB — HEPATITIS C ANTIBODY: Hepatitis C Ab: NONREACTIVE

## 2023-03-09 NOTE — Progress Notes (Deleted)
Cardiology Office Note:    Date:  03/09/2023   ID:  Stanley Kane, DOB [birth year], MRN [id number]  PCP:  [name], MD  [facility] Cardiologist:  Julien Nordmann, MD  [facility] Electrophysiologist:  Lanier Prude, MD   Referring MD: [name], MD   Chief Complaint: ***  History of Present Illness:    Stanley Kane is a 57 y.o. male with a hx of  permanent A. fib diagnosed in 2013 status post DCCV in 06/2012 with recurrent A. fib status post repeat DCCV in 10/2014 with subsequent recurrence on Xarelto, dilated cardiomyopathy, mild mitral regurgitation, remote cocaine use, and obesity who presents for follow-up of his cardiomyopathy and A. fib.   Prior echo from 08/2014 showed an EF of 60-65%, mild MR, mild TR, normal RVSF and PASP.  He was noted to be back in Afib in 2016 in the setting of medication noncompliance. Following resumption of medications, he underwent briefly successful DCCV on 11/25/2014. He was again noted to be back in Afib in 12/2014 and has remained in Afib in subsequent office visits leading to the discontinuation of flecainide and rate control strategy in 09/2016. He underwent calcium scoring in 09/2016 given atypical chest pain with a score of 0.   He underwent nuclear stress testing, in 08/2017, to evaluate for high risk ischemia (with plans to consider rhythm control strategy) that was negative for evidence of ischemia or scar. EF was reduced, felt to be underestimated given Afib. We followed this up with an echo in 10/2017 that showed an EF of 45-50%, no RWMA, mildly dilated left atrium measuring 42 mm, RVSF and PASP were normal.  He was seen in 11/2019 needing annual follow-up for DOT clearance.  He was doing well from a cardiac perspective.  He was noted to not be taking Xarelto due to financial issues.  She underwent Lexiscan MPI in 12/2019 which was a low risk and without evidence of ischemia.  LVEF was mildly decreased at 46%, though visually appeared  to be normal.  Echo was recommended to confirm EF, though remains pending.  He was last seen in the office in 12/2019 and was doing well from a cardiac perspective.  He was noted to be tachycardic with a rate of 125, though reported he was in a hurry and had not yet taken his medications that day.  He was cleared for DOT driving.  Past Medical History:  Diagnosis Date   Anxiety    Chest pain    a. 09/2016 Cor Ca2+ scoring: 0; b. 08/2017 MV: No isch/infarct. EF 33-44%; c. 12/2019 MV: No isch/infarct. EF 46%; d. 11/2021 MV: No isch/infarct. Nl LVEF. No significant Ao/Cor Ca2+.   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 09/2014 Echo: EF 60-65%, mild MR/TR; b. 10/2017 Echo: EF 45-50%, no rwma, mildly dil LA, nl RV fxn.   Obesity    OSA (obstructive sleep apnea)    Persistent atrial fibrillation (HCC)    a. dx 2013 s/p DCCV; b. recurrent Afib 2016 s/p DCCV 11/25/2014; c. 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   hospitalization [facility] - stress with father's illness    Past Surgical History:  Procedure Laterality Date   CARDIOVASCULAR STRESS TEST  ~2005  thinks at [facility], told normal   carotid US  08/2014   no plaque   COLONOSCOPY  2000   WNL, for weight loss after started gym   MRI  08/2014   brain - WNL, skull base C1/2 articulation   TONSILLECTOMY AND ADENOIDECTOMY  childhood   US ECHOCARDIOGRAPHY  08/2014   EF 60%, nl vent fxn, mildly dilated LA, mild MR, mild TR    Current Medications: No outpatient medications have been marked as taking for the 03/09/23 encounter (Appointment) with Stanley Kane, Stanley Kane H, PA-C.     Allergies:   Vicodin [hydrocodone-acetaminophen]   Social History   Socioeconomic History   Marital status: Divorced    Spouse name: Not on file   Number of children: Not on file    Years of education: Not on file   Highest education level: Not on file  Occupational History   Not on file  Tobacco Use   Smoking status: Former    Current packs/day: 0.00    Types: Cigarettes    Quit date: 08/29/1996    Years since quitting: 26.5   Smokeless tobacco: Never  Vaping Use   Vaping status: Never Used  Substance and Sexual Activity   Alcohol use: Not Currently    Alcohol/week: 2.0 standard drinks of alcohol    Types: 2 Cans of beer per week    Comment: quit beginning of 2020   Drug use: Yes    Types: Cocaine   Sexual activity: Yes  Other Topics Concern   Not on file  Social History Narrative   Occupation: truck driver, just started back   Edu: 9th grade, GED   Social Determinants of Health   Financial Resource Strain: Not on file  Food Insecurity: Not on file  Transportation Needs: Not on file  Physical Activity: Inactive (04/18/2019)   Exercise Vital Sign    Days of Exercise per Week: 0 days    Minutes of Exercise per Session: 0 min  Stress: Not on file  Social Connections: Not on file     Family History: The patient's ***family history includes CAD (age of onset: 8) in his sister; Cirrhosis in his father; Coronary artery disease (age of onset: 29) in his father; Diabetes in his mother; GI Disease in his sister; Heart attack in his father; Heart disease in his mother; Hypercalcemia in his father; Hypertension in his father. There is no history of Stroke.  ROS:   Please see the history of present illness.    *** All other systems reviewed and are negative.  EKGs/Labs/Other Studies Reviewed:    The following studies were reviewed today: ***  EKG:  EKG is *** ordered today.  The ekg ordered today demonstrates ***  Recent Labs: 10/12/2022: B Natriuretic Peptide 162.2 02/21/2023: Hemoglobin 13.1; Platelets 313 03/08/2023: ALT 30; BUN 12; Creatinine, Ser 0.96; Potassium 4.0; Sodium 138; TSH 2.28  Recent Lipid Panel    Component Value Date/Time   CHOL  169 03/08/2023 0906   CHOL 157 09/25/2014 1731   CHOL 175 10/10/2011 0000   TRIG 425.0 (H) 03/08/2023 0906   TRIG 250 (H) 09/25/2014 1731   TRIG 93 10/10/2011 0000   HDL 40.80 03/08/2023 0906   HDL 33 (L) 09/25/2014 1731   CHOLHDL 4 03/08/2023 0906   VLDL 41.2 (H) 12/27/2021 1218   VLDL 50 (H) 09/25/2014 1731   LDLCALC 103 (H) 09/08/2015 1036   LDLCALC 74 09/25/2014 1731   LDLDIRECT 89.0 03/08/2023 0906     Risk Assessment/Calculations:   {  Does this patient have ATRIAL FIBRILLATION?:[phone]}   Physical Exam:    VS:  There were no vitals taken for this visit.    Wt Readings from Last 3 Encounters:  02/24/23 229 lb (103.9 kg)  02/20/23 210 lb (95.3 kg)  01/13/23 199 lb 15.3 oz (90.7 kg)     GEN: *** Well nourished, well developed in no acute distress HEENT: Normal NECK: No JVD; No carotid bruits LYMPHATICS: No lymphadenopathy CARDIAC: ***RRR, no murmurs, rubs, gallops RESPIRATORY:  Clear to auscultation without rales, wheezing or rhonchi  ABDOMEN: Soft, non-tender, non-distended MUSCULOSKELETAL:  No edema; No deformity  SKIN: Warm and dry NEUROLOGIC:  Alert and oriented x 3 PSYCHIATRIC:  Normal affect   ASSESSMENT:    No diagnosis found. PLAN:    In order of problems listed above:  ***  Disposition: Follow up {follow up:15908} with ***   Shared Decision Making/Informed Consent   {Are you ordering a CV Procedure (e.g. stress test, cath, DCCV, TEE, etc)?   Press F2        :[id number]}    Signed, Truth Barot David Stall, PA-C  03/09/2023 7:05 AM    [facility]

## 2023-03-10 MED ORDER — OXYCODONE-ACETAMINOPHEN 5-325 MG PO TABS: 1.0000 | ORAL_TABLET | Freq: Four times a day (QID) | ORAL | 0 refills | Status: DC | PRN

## 2023-03-10 NOTE — Telephone Encounter (Signed)
Rx sent 

## 2023-03-14 ENCOUNTER — Telehealth: Payer: Self-pay

## 2023-03-14 NOTE — Telephone Encounter (Addendum)
Will work on this tomorrow at [employer] with patient.

## 2023-03-14 NOTE — Telephone Encounter (Signed)
Received faxed referral form from [facility] go be completed.   Placed form in Dr. Timoteo Expose box.

## 2023-03-15 ENCOUNTER — Ambulatory Visit: Payer: Medicaid Other | Admitting: Family Medicine

## 2023-03-15 ENCOUNTER — Other Ambulatory Visit
Admission: RE | Admit: 2023-03-15 | Discharge: 2023-03-15 | Disposition: A | Discharge status: Home / Self Care | Payer: Medicaid Other | Source: Ambulatory Visit | Attending: Medical | Admitting: Medical

## 2023-03-15 ENCOUNTER — Encounter: Payer: Self-pay | Admitting: Family Medicine

## 2023-03-15 ENCOUNTER — Telehealth: Payer: Self-pay

## 2023-03-15 ENCOUNTER — Encounter: Payer: Self-pay | Admitting: Medical

## 2023-03-15 ENCOUNTER — Ambulatory Visit: Payer: Medicaid Other | Attending: Cardiovascular Disease | Admitting: Medical

## 2023-03-15 VITALS — BP 124/80 | HR 72 | Temp 97.9°F | Ht 67.0 in | Wt 223.4 lb

## 2023-03-15 VITALS — BP 100/62 | HR 88 | Ht 67.0 in | Wt 226.0 lb

## 2023-03-15 DIAGNOSIS — E669 Obesity, unspecified: Secondary | ICD-10-CM

## 2023-03-15 DIAGNOSIS — I420 Dilated cardiomyopathy: Secondary | ICD-10-CM

## 2023-03-15 DIAGNOSIS — E785 Hyperlipidemia, unspecified: Secondary | ICD-10-CM

## 2023-03-15 DIAGNOSIS — R7303 Prediabetes: Secondary | ICD-10-CM

## 2023-03-15 DIAGNOSIS — I5022 Chronic systolic (congestive) heart failure: Secondary | ICD-10-CM

## 2023-03-15 DIAGNOSIS — Z0001 Encounter for general adult medical examination with abnormal findings: Secondary | ICD-10-CM

## 2023-03-15 DIAGNOSIS — I4821 Permanent atrial fibrillation: Secondary | ICD-10-CM | POA: Diagnosis not present

## 2023-03-15 DIAGNOSIS — R0609 Other forms of dyspnea: Secondary | ICD-10-CM

## 2023-03-15 DIAGNOSIS — N200 Calculus of kidney: Secondary | ICD-10-CM

## 2023-03-15 DIAGNOSIS — Z1211 Encounter for screening for malignant neoplasm of colon: Secondary | ICD-10-CM

## 2023-03-15 DIAGNOSIS — K760 Fatty (change of) liver, not elsewhere classified: Secondary | ICD-10-CM | POA: Diagnosis not present

## 2023-03-15 DIAGNOSIS — R6882 Decreased libido: Secondary | ICD-10-CM

## 2023-03-15 DIAGNOSIS — Z23 Encounter for immunization: Secondary | ICD-10-CM | POA: Diagnosis not present

## 2023-03-15 DIAGNOSIS — F1491 Cocaine use, unspecified, in remission: Secondary | ICD-10-CM | POA: Diagnosis not present

## 2023-03-15 DIAGNOSIS — E559 Vitamin D deficiency, unspecified: Secondary | ICD-10-CM

## 2023-03-15 DIAGNOSIS — G4733 Obstructive sleep apnea (adult) (pediatric): Secondary | ICD-10-CM

## 2023-03-15 DIAGNOSIS — Z5986 Financial insecurity: Secondary | ICD-10-CM

## 2023-03-15 DIAGNOSIS — F411 Generalized anxiety disorder: Secondary | ICD-10-CM | POA: Diagnosis not present

## 2023-03-15 DIAGNOSIS — Z01810 Encounter for preprocedural cardiovascular examination: Secondary | ICD-10-CM

## 2023-03-15 DIAGNOSIS — I5032 Chronic diastolic (congestive) heart failure: Secondary | ICD-10-CM

## 2023-03-15 DIAGNOSIS — I5020 Unspecified systolic (congestive) heart failure: Secondary | ICD-10-CM

## 2023-03-15 DIAGNOSIS — R21 Rash and other nonspecific skin eruption: Secondary | ICD-10-CM

## 2023-03-15 DIAGNOSIS — Z59869 Financial insecurity, unspecified: Secondary | ICD-10-CM

## 2023-03-15 DIAGNOSIS — E66811 Obesity, class 1: Secondary | ICD-10-CM

## 2023-03-15 LAB — DIGOXIN LEVEL: Digoxin Level: 0.4 ng/mL — ABNORMAL LOW (ref 0.8–2.0)

## 2023-03-15 MED ORDER — CALCIPOTRIENE 0.005 % EX CREA: TOPICAL_CREAM | Freq: Two times a day (BID) | CUTANEOUS | 0 refills | Status: DC

## 2023-03-15 MED ORDER — ESCITALOPRAM OXALATE 10 MG PO TABS
10.0000 mg | ORAL_TABLET | Freq: Every day | ORAL | 6 refills | Status: DC
Start: 2023-03-15 — End: 2023-06-26

## 2023-03-15 NOTE — Assessment & Plan Note (Addendum)
Refer to care coordinator

## 2023-03-15 NOTE — Patient Instructions (Signed)
Medication Instructions:  Your physician recommends that you continue on your current medications as directed. Please refer to the Current Medication list given to you today.  *If you need a refill on your cardiac medications before your next appointment, please call your pharmacy*  Lab Work: Your physician recommends that you get lab work: Digoxin level Sales executive at [facility] 1st desk on the right to check in (REGISTRATION)  Lab hours: Monday- Friday (7:30 am- 5:30 pm)  If you have labs (blood work) drawn today and your tests are completely normal, you will receive your results only by: MyChart Message (if you have MyChart) OR A paper copy in the mail If you have any lab test that is abnormal or we need to change your treatment, we will call you to review the results.   Testing/Procedures: Your physician has requested that you have an echocardiogram. Echocardiography is a painless test that uses sound waves to create images of your heart. It provides your doctor with information about the size and shape of your heart and how well your heart's chambers and valves are working. This procedure takes approximately one hour. There are no restrictions for this procedure. Please do NOT wear cologne, perfume, aftershave, or lotions (deodorant is allowed). Please arrive 15 minutes prior to your appointment time.   Follow-Up: At [facility], you and your health needs are our priority.  As part of our continuing mission to provide you with exceptional heart care, we have created designated Provider Care Teams.  These Care Teams include your primary Cardiologist (physician) and Advanced Practice Providers (APPs -  Physician Assistants and Nurse Practitioners) who all work together to provide you with the care you need, when you need it.  Your next appointment:   3 month(s)  Provider:   You may see Julien Nordmann, MD or one of the following Advanced Practice Providers on your  designated Care Team:   Nicolasa Ducking, NP [name], PA-C Cadence Fransico Michael, PA-C Charlsie Quest, NP    Other Instructions -None

## 2023-03-15 NOTE — Assessment & Plan Note (Addendum)
Chronic, followed by cardiology with upcoming appt later today.  Has missed several PCP and cardiology appts in the past year, attributes to recent stressors.

## 2023-03-15 NOTE — Telephone Encounter (Signed)
Called left on admin line [phone]  Pt states he is hurting really bad and needs a RF of oxycodone.   Kidney stone(left)- needs Litho- waiting on clearance from cardiologist.   Last RF- Given on 7/12 # 15   Pls advise.

## 2023-03-15 NOTE — Patient Instructions (Addendum)
Second shingles shot today.  We will refer you to GI for colonoscopy.  You will need tetanus shot next visit  Cut down on paxil to 1/2 tablet daily for 1 week then stop. When you stop paxil, may start lexapro 10mg  daily sent to pharmacy. Let me know how you do with this.  Start vitamin D cream (calcioptriene) twice daily for skin rash.  We will refer you to skin doctor.  Return in 3 months for follow up visit

## 2023-03-15 NOTE — Progress Notes (Signed)
Cardiology Office Note:    Date:  03/15/2023   ID:  [name], DOB 26-Jul-1966, MRN [id number]  PCP:  Stanley Boyden, MD  [facility] Cardiologist:  Stanley Nordmann, MD  [facility] Electrophysiologist:  Stanley Prude, MD   Referring MD: Stanley Boyden, MD   Chief Complaint: pre-op evaluation  History of Present Illness:    Stanley Kane is a 57 y.o. male with a hx of permanent A. fib diagnosed in 2013 status post DCCV in 06/2012 with recurrent A. fib status post repeat DCCV in 10/2014 with subsequent recurrence on Xarelto, dilated cardiomyopathy, mild mitral regurgitation, pre-diabetes, GAD, remote cocaine use, and obesity who presents for follow-up.   Prior echo from 08/2014 showed an EF of 60-65%, mild MR, mild TR, normal RVSF and PASP.  He was noted to be back in Afib in 2016 in the setting of medication noncompliance. Following resumption of medications, he underwent briefly successful DCCV on 11/25/2014. He was again noted to be back in Afib in 12/2014 and has remained in Afib in subsequent office visits leading to the discontinuation of flecainide and rate control strategy in 09/2016. He underwent calcium scoring in 09/2016 given atypical chest pain with a score of 0.   He underwent nuclear stress testing, in 08/2017, to evaluate for high risk ischemia (with plans to consider rhythm control strategy) that was negative for evidence of ischemia or scar. EF was reduced, felt to be underestimated given Afib. We followed this up with an echo in 10/2017 that showed an EF of 45-50%, no RWMA, mildly dilated left atrium measuring 42 mm, RVSF and PASP were normal.  He was seen in 11/2019 needing annual follow-up for DOT clearance.  He was doing well from a cardiac perspective.  He was noted to not be taking Xarelto due to financial issues.  She underwent Lexiscan MPI in 12/2019 which was a low risk and without evidence of ischemia.  LVEF was mildly decreased at 46%, though visually appeared  to be normal.  Echo was recommended to confirm EF, though remains pending.  He was last seen in the office in 12/2019 and was doing well from a cardiac perspective.  He was noted to be tachycardic with a rate of 125, though reported he was in a hurry and had not yet taken his medications that day.  He was cleared for DOT driving.  The patient was last seen 11/2021 for DOT clearance. Myoview lexiscan was low risk, normal perfusion, normal LVEF, no significant coronary calcifications.  The patient presents for pre-op visit for kidney stone. Procedure scheduled for the 25th. The patient has a lot of anxiety surrounding life and negative life events. He is going to start anxiety medications. 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 Things are still financially hard.   The patient denies chest pain. He has SOB on exertion and palpitations from anxiety. No lower leg edema on exam. He takes lasix as needed for extra fluid. He is in rate controlled Afib. He takes Xarelto, no bleeding issues with this. Pharmacy instructions recommend holding Xarelto up to 3 days prior to procedure. Recent labs reviewed.   Past Medical History:  Diagnosis Date   Anxiety    Chest pain    a. 09/2016 Cor Ca2+ scoring: 0; b. 08/2017 MV: No isch/infarct. EF 33-44%; c. 12/2019 MV: No isch/infarct. EF 46%; d. 11/2021 MV: No isch/infarct. Nl LVEF. No significant Ao/Cor Ca2+.   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 09/2014 Echo: EF 60-65%, mild MR/TR; b. 10/2017 Echo: EF 45-50%, no rwma, mildly dil LA, nl RV fxn.   Obesity    OSA (obstructive sleep apnea)    Persistent atrial fibrillation (HCC)    a. dx 2013 s/p DCCV; b. recurrent Afib 2016 s/p DCCV 11/25/2014; c. 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    hospitalization [facility] - stress with father's illness    Past Surgical History:  Procedure Laterality Date   CARDIOVASCULAR STRESS TEST  ~2005   thinks at [facility], told normal   carotid US  08/2014   no plaque   COLONOSCOPY  2000   WNL, for weight loss after started gym   MRI  08/2014   brain - WNL, skull base C1/2 articulation   TONSILLECTOMY AND ADENOIDECTOMY  childhood   US ECHOCARDIOGRAPHY  08/2014   EF 60%, nl vent fxn, mildly dilated LA, mild MR, mild TR    Current Medications: Current Meds  Medication Sig   calcipotriene (DOVONOX) 0.005 % cream Apply topically 2 (two) times daily.   digoxin (LANOXIN) 0.125 MG tablet Take 1 tablet (125 mcg total) by mouth daily. * NEED TO SCHEDULE AN OFFICE VISIT FOR FURTHER REFILLS*   escitalopram (LEXAPRO) 10 MG tablet Take 1 tablet (10 mg total) by mouth daily.   furosemide (LASIX) 20 MG tablet Take 1 tablet (20 mg total) by mouth as needed for edema.   metoprolol succinate (TOPROL-XL) 25 MG 24 hr tablet Take 1 tablet (25 mg total) by mouth daily. *NEED TO SCHEDULE AN OFFICE VISIT FOR FURTHER REFILLS*   oxyCODONE-acetaminophen (PERCOCET/ROXICET) 5-325 MG tablet Take 1 tablet by mouth every 6 (six) hours as needed for severe pain.   rivaroxaban (XARELTO) 20 MG TABS tablet Take 1 tablet (20 mg total) by mouth daily with supper.   triamcinolone cream (KENALOG) 0.1 % APPLY TO AFFECTED AREA TWICE A DAY AS DIRECTED     Allergies:   Vicodin [hydrocodone-acetaminophen]   Social History   Socioeconomic History   Marital status: Divorced    Spouse name: Not on file   Number of children: Not on file   Years of education: Not on file   Highest education level: Not on file  Occupational History   Not on file  Tobacco Use   Smoking status: Former    Current packs/day: 0.00    Types: Cigarettes    Quit date: 08/29/1996    Years since quitting: 26.5   Smokeless tobacco: Never  Vaping Use   Vaping status: Never Used  Substance and Sexual  Activity   Alcohol use: Not Currently    Alcohol/week: 2.0 standard drinks of alcohol    Types: 2 Cans of beer per week    Comment: quit beginning of 2020   Drug use: Yes    Types: Cocaine   Sexual activity: Yes  Other Topics Concern   Not on file  Social History Narrative   Occupation: truck driver, just started back   Edu: 9th grade, GED   Social Determinants of Health   Financial Resource Strain: Not on file  Food Insecurity: Not on file  Transportation Needs: Not on file  Physical Activity: Inactive (04/18/2019)   Exercise Vital Sign    Days of  Exercise per Week: 0 days    Minutes of Exercise per Session: 0 min  Stress: Not on file  Social Connections: Not on file     Family History: The patient's family history includes CAD (age of onset: 63) in his sister; Cirrhosis in his father; Coronary artery disease (age of onset: 75) in his father; Diabetes in his mother; GI Disease in his sister; Heart attack in his father; Heart disease in his mother; Hypercalcemia in his father; Hypertension in his father. There is no history of Stroke.  ROS:   Please see the history of present illness.     All other systems reviewed and are negative.  EKGs/Labs/Other Studies Reviewed:    The following studies were reviewed today:  Myoview Lexiscan 11/2021    The study is normal. The study is low risk.   No ST deviation was noted.   LV perfusion is normal. There is no evidence of ischemia. There is no evidence of infarction.   Left ventricular function is normal. End diastolic cavity size is normal. End systolic cavity size is normal.   CT attenuation images with no significant aortic or coronary calcifications.  Myoview lexiscan 2021 Narrative & Impression  The study is normal. This is a low risk study. The left ventricular ejection fraction is mildly decreased (46%) on measurment. Although visually, EF appears normal. Recommend comfirming EF with Echo There is no evidence for ischemia    Echo 2019 Study Conclusions   - Left ventricle: The cavity size was normal. Systolic function was    mildly reduced. The estimated ejection fraction was in the range    of 45% to 50%. Wall motion was normal; there were no regional    wall motion abnormalities. The study is not technically    sufficient to allow evaluation of LV diastolic function.  - Left atrium: The atrium was mildly dilated.  - Right ventricle: Systolic function was normal.  - Pulmonary arteries: Systolic pressure was within the normal    range.   Impressions:   - Atrial fibrillation rate 100 to 120 bpm.   EKG:  EKG is ordered today.  The ekg ordered today demonstrates Afib 88bpm, nonspecific T wave changes  Recent Labs: 10/12/2022: B Natriuretic Peptide 162.2 02/21/2023: Hemoglobin 13.1; Platelets 313 03/08/2023: ALT 30; BUN 12; Creatinine, Ser 0.96; Potassium 4.0; Sodium 138; TSH 2.28  Recent Lipid Panel    Component Value Date/Time   CHOL 169 03/08/2023 0906   CHOL 157 09/25/2014 1731   CHOL 175 10/10/2011 0000   TRIG 425.0 (H) 03/08/2023 0906   TRIG 250 (H) 09/25/2014 1731   TRIG 93 10/10/2011 0000   HDL 40.80 03/08/2023 0906   HDL 33 (L) 09/25/2014 1731   CHOLHDL 4 03/08/2023 0906   VLDL 41.2 (H) 12/27/2021 1218   VLDL 50 (H) 09/25/2014 1731   LDLCALC 103 (H) 09/08/2015 1036   LDLCALC 74 09/25/2014 1731   LDLDIRECT 89.0 03/08/2023 0906    Physical Exam:    VS:  BP 100/62 (BP Location: Left Arm, Patient Position: Sitting, Cuff Size: Normal)   Pulse 88   Ht 5\' 7"  (1.702 m)   Wt 226 lb (102.5 kg)   SpO2 98%   BMI 35.40 kg/m     Wt Readings from Last 3 Encounters:  03/15/23 226 lb (102.5 kg)  03/15/23 223 lb 6 oz (101.3 kg)  02/24/23 229 lb (103.9 kg)     GEN:  Well nourished, well developed in no acute distress HEENT: Normal  NECK: No JVD; No carotid bruits LYMPHATICS: No lymphadenopathy CARDIAC: Irreg Irreg, no murmurs, rubs, gallops RESPIRATORY:  Clear to auscultation without  rales, wheezing or rhonchi  ABDOMEN: Soft, non-tender, non-distended MUSCULOSKELETAL:  No edema; No deformity  SKIN: Warm and dry NEUROLOGIC:  Alert and oriented x 3 PSYCHIATRIC:  Normal affect   ASSESSMENT:    1. DOE (dyspnea on exertion)   2. Heart failure with improved ejection fraction (HFimpEF) (HCC)   3. Permanent atrial fibrillation (HCC)   4. Pre-operative cardiovascular examination    PLAN:    In order of problems listed above:  DOE HFmrEF Echo in 2019 showed LVEF 45-50%. Subsequent EF on MPI showed normal LVEF. He reports DOE. He appears euvolemic on exam. He takes lasix as needed for swelling. Continue BB therapy. Low BP limiting GDMT. I will order an echo.   Permanent Afib In rate controlled Afib by EKG. He takes digoxin and Toprol. Check dig level today.  Pre-op evaluation Plan for shock wave lithotripsy for renal stones next week. He denies chest pain. He reports DOE. He appears euvolemic as above. Myoview in 2023 was unremarkable. EKG shows rate controlled Afib. BP is soft, which is normal for the patient. METS>4. I will order an echocardiogram. According to the RCRI he is Class 2 risk, 6% 30 day risk of death, MI or cardiac arrest. If echo looks good, Ok to undergo procedure. Per Pharmacy, Ok to hold Xarelto for 3 days prior to procedure. Restart Xarelto post-procedure per surgeon recommendations.  Disposition: Follow up in 3 month(s) with MD/APP   Signed, Onika Gudiel David Stall, PA-C  03/15/2023 4:37 PM    [facility]
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
## 2023-03-15 NOTE — Assessment & Plan Note (Signed)
Anxiety > depression, worsening. Feels paxil no longer effective.  Will drop to 20mg  daily for 1 wk then stop and in its place start lexapro 10mg  daily. Discussed possible QT prolongation effect - to check with cards prior to starting.  No SI/HI.

## 2023-03-15 NOTE — Assessment & Plan Note (Addendum)
Preventative protocols reviewed and updated unless pt declined. Discussed healthy diet and lifestyle.  Overdue for colon cancer screening - interested in colonoscopy, GI referral placed

## 2023-03-16 ENCOUNTER — Other Ambulatory Visit: Payer: Self-pay

## 2023-03-16 DIAGNOSIS — I5022 Chronic systolic (congestive) heart failure: Secondary | ICD-10-CM | POA: Insufficient documentation

## 2023-03-16 DIAGNOSIS — N200 Calculus of kidney: Secondary | ICD-10-CM | POA: Insufficient documentation

## 2023-03-16 DIAGNOSIS — I5020 Unspecified systolic (congestive) heart failure: Secondary | ICD-10-CM | POA: Insufficient documentation

## 2023-03-16 DIAGNOSIS — R6882 Decreased libido: Secondary | ICD-10-CM | POA: Insufficient documentation

## 2023-03-16 NOTE — Assessment & Plan Note (Addendum)
LDL 89. Trig markedly elevated however he was not fasting when this was drawn - had just eaten biscuit breakfast sandwich. Will recheck next fasting labs.  The 10-year ASCVD risk score (Arnett DK, et al., 2019) is: 4%   Values used to calculate the score:     Age: 57 years     Sex: Male     Is Non-Hispanic African American: No     Diabetic: No     Tobacco smoker: No     Systolic Blood Pressure: 100 mmHg     Is BP treated: No     HDL Cholesterol: 40.8 mg/dL     Total Cholesterol: 169 mg/dL

## 2023-03-16 NOTE — Assessment & Plan Note (Signed)
During high stress period.  Denies recurrent use.  Discussed risks to heart.

## 2023-03-16 NOTE — Assessment & Plan Note (Signed)
Reassess with SSRI change. If ongoing, consider T eval, wellbutrin antidepressant trial.

## 2023-03-16 NOTE — Assessment & Plan Note (Addendum)
Anticipate plaque psoriasis.  He's been chronically using triamcinolone topical steroid - discussed risks of longterm steroid use. Will trial vit D analog calcipotriene BID.  Increasing %BSA affected anticipate may need systemic treatment. Working on derm referral.

## 2023-03-16 NOTE — Assessment & Plan Note (Signed)
Appreciate cardiology care.  °

## 2023-03-16 NOTE — Assessment & Plan Note (Signed)
Stop oral vit D , start topical vit D for presumed psoriasis

## 2023-03-16 NOTE — Assessment & Plan Note (Addendum)
H/o this has previously declined CPAP. Will need to readdress at f/u visit.

## 2023-03-16 NOTE — Assessment & Plan Note (Signed)
S/p ER visit, seeing urology, planned lithotripsy.

## 2023-03-16 NOTE — Assessment & Plan Note (Signed)
H/o this on prior imaging - consider Hep A/B series.

## 2023-03-16 NOTE — Assessment & Plan Note (Signed)
Reviewed A1c encouraged limiting added sugars in diet

## 2023-03-16 NOTE — Assessment & Plan Note (Signed)
Encouraged healthy diet and lifestyle choices to affect sustainable weight loss.  ?

## 2023-03-17 ENCOUNTER — Telehealth: Payer: Self-pay | Admitting: Pharmacist

## 2023-03-17 MED ORDER — OXYCODONE-ACETAMINOPHEN 5-325 MG PO TABS: 1.0000 | ORAL_TABLET | Freq: Four times a day (QID) | ORAL | 0 refills | Status: DC | PRN

## 2023-03-17 NOTE — Telephone Encounter (Signed)
   Patient Name: Stanley Kane  DOB: [birth year] MRN: [id number]  Primary Cardiologist: Julien Nordmann, MD  Chart reviewed as part of pre-operative protocol coverage. Pt was seen in the office on 03/15/2023 for preoperative cardiac evaluation. Pt is pending echo. Final clearance to be provided by APP who saw pt on 03/15/2023.   Joylene Grapes, NP 03/17/2023, 12:23 PM

## 2023-03-17 NOTE — Telephone Encounter (Signed)
Sent sent per Dr. Lonna Cobb

## 2023-03-17 NOTE — Telephone Encounter (Signed)
Faxed form to [name] at [phone].

## 2023-03-17 NOTE — Telephone Encounter (Signed)
Patient with diagnosis of afib on Xarelto for anticoagulation.    Procedure: EXTRACORPOREAL SHOCK WAVE LITHOTRIPSY (ESWL)  Date of procedure: 03/23/23   CHA2DS2-VASc Score = 1   This indicates a 0.6% annual risk of stroke. The patient's score is based upon: CHF History: 1 HTN History: 0 Diabetes History: 0 Stroke History: 0 Vascular Disease History: 0 Age Score: 0 Gender Score: 0       CrCl 98 ml/min  Per office protocol, patient can hold Xarelto for 2-3 days prior to procedure.     **This guidance is not considered finalized until pre-operative APP has relayed final recommendations.**

## 2023-03-22 ENCOUNTER — Other Ambulatory Visit: Payer: Medicaid Other

## 2023-03-22 ENCOUNTER — Telehealth: Payer: Self-pay | Admitting: Family Medicine

## 2023-03-22 NOTE — Patient Instructions (Signed)
Visit Information  Stanley Kane was given information about Medicaid Managed Care team care coordination services as a part of their [place] benefit. [name] verbally consented to engagement with the [facility] Managed Care team.   If you are experiencing a medical emergency, please call 911 or report to your local emergency department or urgent care.   If you have a non-emergency medical problem during routine business hours, please contact your provider's office and ask to speak with a nurse.   For questions related to your [place] health plan, please call: [phone]  If you would like to schedule transportation through your [place] plan, please call the following number at least 2 days in advance of your appointment: [phone].   There is no limit to the number of trips during the year between medical appointments, healthcare facilities, or pharmacies. Transportation must be scheduled at least 2 business days before but not more than thirty 30 days before of your appointment.  Call the Behavioral Health Crisis Line at [phone], at any time, 24 hours a day, 7 days a week. If you are in danger or need immediate medical attention call 911.  If you would like help to quit smoking, call 1-800-QUIT-NOW ((571)310-6502) OR Espaol: 1-855-Djelo-Ya (4-401-027-2536) o para ms informacin haga clic aqu or Text READY to 644-034 to register via text  [name] - following are the goals we discussed in your visit today:     The  Patient                                              has been provided with contact information for the Managed Medicaid care management team and has been advised to call with any health related questions or concerns.   [name], [name], MHA [facility] Health  Managed Medicaid Social Worker [phone]   Following is a copy of your plan of care:  There are no care plans that you recently modified to  display for this patient.

## 2023-03-22 NOTE — Telephone Encounter (Signed)
[facility] is requesting the referral be sent again. There was an issue with how it processed with pts medicaid.  Pt spoke with Britta Mccreedy at [facility]

## 2023-03-22 NOTE — Patient Outreach (Signed)
  Medicaid Managed Care Social Work Note  03/22/2023 Name:  Stanley Kane MRN:  [id number] DOB:  [birth year]  Stanley Kane is an 57 y.o. year old male who is a primary patient of [name], MD.  The [facility] Managed Care Coordination team was consulted for assistance with:   finances  Mr. Cianci was given information about Medicaid Managed Care Coordination team services today. Theodoro Grist Klauer Patient agreed to services and verbal consent obtained.  Engaged with patient  for by telephone forinitial visit in response to referral for case management and/or care coordination services.   Assessments/Interventions:  Review of past medical history, allergies, medications, health status, including review of consultants reports, laboratory and other test data, was performed as part of comprehensive evaluation and provision of chronic care management services.  SDOH: (Social Determinant of Health) assessments and interventions performed: SDOH Interventions    Flowsheet Row Office Visit from 03/15/2023 in [facility] Visit from 08/14/2017 in [facility] [place]  SDOH Interventions    Depression Interventions/Treatment  Medication, Currently on Treatment Medication      Bsw completed a telephone outreach with patient, he states he currently lives with his sister, he receives foodstamps and Medicaid. He just applied for disability. Patient states no resources are needed at this time. Advanced Directives Status:  Not addressed in this encounter.  Care Plan                 Allergies  Allergen Reactions   Vicodin [Hydrocodone-Acetaminophen]     Makes him "jittery"    Medications Reviewed Today   Medications were not reviewed in this encounter     Patient Active Problem List   Diagnosis Date Noted   Heart failure with mildly reduced ejection fraction (HFmrEF) (HCC) 03/16/2023   Nephrolithiasis 03/16/2023   Decreased  libido 03/16/2023   Financial insecurity 03/15/2023   Vitamin D deficiency 02/23/2023   History of cocaine use 10/15/2022   Dyslipidemia 01/03/2022   Skin rash 12/27/2021   Mass of soft tissue of chest 12/27/2021   Dilated cardiomyopathy (HCC) 12/12/2018   Encounter for general adult medical examination with abnormal findings 09/07/2015   Concussion without loss of consciousness 03/25/2015   Carpal tunnel syndrome 01/29/2015   Obstructive sleep apnea 12/29/2014   Hand pain 11/24/2014   Transient global amnesia 10/05/2014   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   Follow up:  Patient requests no follow-up at this time.  Plan: The  Patient has been provided with contact information for the Managed Medicaid care management team and has been advised to call with any health related questions or concerns.    Abelino Derrick, MHA [facility]  Managed [facility] Social Worker [phone]

## 2023-03-23 ENCOUNTER — Ambulatory Visit: Admission: RE | Admit: 2023-03-23 | Payer: Medicaid Other | Source: Home / Self Care | Admitting: Urology

## 2023-03-23 ENCOUNTER — Encounter: Admission: RE | Payer: Self-pay | Source: Home / Self Care

## 2023-03-23 SURGERY — LITHOTRIPSY, ESWL
Anesthesia: Moderate Sedation | Laterality: Left

## 2023-03-24 ENCOUNTER — Ambulatory Visit: Payer: 59 | Admitting: Cardiovascular Disease

## 2023-03-27 ENCOUNTER — Other Ambulatory Visit: Payer: Self-pay

## 2023-03-27 NOTE — Telephone Encounter (Signed)
Per Charlene patient called 4 times and left a message stating he needs refill on pain medication to help him since surgery is post poned.

## 2023-03-28 ENCOUNTER — Ambulatory Visit: Payer: Self-pay | Admitting: Medical

## 2023-03-29 MED ORDER — OXYCODONE-ACETAMINOPHEN 5-325 MG PO TABS: 1.0000 | ORAL_TABLET | Freq: Four times a day (QID) | ORAL | 0 refills | Status: DC | PRN

## 2023-04-03 ENCOUNTER — Telehealth: Payer: Self-pay | Admitting: Family Medicine

## 2023-04-03 ENCOUNTER — Telehealth: Payer: Self-pay | Admitting: Urology

## 2023-04-03 DIAGNOSIS — R21 Rash and other nonspecific skin eruption: Secondary | ICD-10-CM

## 2023-04-03 MED ORDER — OXYCODONE-ACETAMINOPHEN 5-325 MG PO TABS: 1.0000 | ORAL_TABLET | Freq: Four times a day (QID) | ORAL | 0 refills | Status: DC | PRN

## 2023-04-03 NOTE — Telephone Encounter (Signed)
It was my understanding that patient had to call and get PCP name changed. If we can call I am more than happy to help. Do you know anything about this change?

## 2023-04-03 NOTE — Telephone Encounter (Signed)
Due to narcotic prescribing guidelines only a 5-day supply can be sent.  Rx was sent

## 2023-04-03 NOTE — Telephone Encounter (Signed)
Notified patient as instructed, patient pleased °

## 2023-04-03 NOTE — Telephone Encounter (Signed)
Patient called in stating that he is having a hard time getting in at the dermatology office he was referred too. He said that [place] [facility] is showing that he sees another doctor as his PCP. He said that they told him Dr Reece Agar has to contact [place] [facility] to get it changed hisself. He would like to know if the referral can be sent out to another dermatology office since he is having a hard time getting in the one he was refereed too.

## 2023-04-03 NOTE — Telephone Encounter (Signed)
Pt stopped by office requesting more pain meds.  His litho is on 8/15 and he's out.  He would like a 30 day supply sent to [facility].

## 2023-04-04 NOTE — Telephone Encounter (Signed)
I'm not sure how to get this changed. Will forward to Amy for assistance.  I can place new derm referral if that's necessary.

## 2023-04-10 ENCOUNTER — Telehealth: Payer: Self-pay | Admitting: *Deleted

## 2023-04-10 NOTE — Telephone Encounter (Signed)
Patient called in today and states he needs a refill on his pain medication . He has a appt with his cardiologist on Thursday.

## 2023-04-11 MED ORDER — OXYCODONE-ACETAMINOPHEN 5-325 MG PO TABS: 1.0000 | ORAL_TABLET | Freq: Four times a day (QID) | ORAL | 0 refills | Status: DC | PRN

## 2023-04-11 NOTE — Addendum Note (Signed)
Addended by: Riki Altes on: 04/11/2023 05:07 PM   Modules accepted: Orders

## 2023-04-12 ENCOUNTER — Other Ambulatory Visit: Payer: Self-pay | Admitting: Family Medicine

## 2023-04-12 DIAGNOSIS — R21 Rash and other nonspecific skin eruption: Secondary | ICD-10-CM

## 2023-04-12 NOTE — Telephone Encounter (Signed)
Prescription Request  04/12/2023  LOV: 03/15/2023  What is the name of the medication or equipment? calcipotriene (DOVONOX) 0.005 % cream   Have you contacted your pharmacy to request a refill? No   Which pharmacy would you like this sent to?  [facility] - [place], [place] - [address] [place] [address] Phone: [phone] Fax: [phone]    Patient notified that their request is being sent to the clinical staff for review and that they should receive a response within 2 business days.   Please advise at Mobile [phone] (mobile)  Patient is having a hard time getting into dermatologist, is asking for Dr. Reece Agar can refill this. Patient asked if he could get a bigger supply or some refills on this? Patient asked if Dr. Reece Agar could also place a referral to [facility] to try to get in sooner

## 2023-04-12 NOTE — Telephone Encounter (Signed)
Calcipotriene crm Last rx:  03/15/23, #120 g Last OV:  03/15/23, CPE Next OV:  06/16/23, 3 mo f/u

## 2023-04-13 ENCOUNTER — Ambulatory Visit: Payer: Medicaid Other

## 2023-04-13 DIAGNOSIS — I4821 Permanent atrial fibrillation: Secondary | ICD-10-CM | POA: Diagnosis not present

## 2023-04-13 LAB — ECHOCARDIOGRAM COMPLETE: S' Lateral: 2.2 cm

## 2023-04-13 MED ORDER — CALCIPOTRIENE 0.005 % EX CREA
TOPICAL_CREAM | Freq: Two times a day (BID) | CUTANEOUS | 3 refills | Status: DC
Start: 2023-04-13 — End: 2023-06-26

## 2023-04-13 MED ORDER — PERFLUTREN LIPID MICROSPHERE
1.0000 mL | INTRAVENOUS | Status: AC | PRN
Start: 2023-04-13 — End: 2023-04-13
  Administered 2023-04-13: 3 mL via INTRAVENOUS

## 2023-04-13 NOTE — Telephone Encounter (Signed)
New referral placed to [facility] per pt request

## 2023-04-13 NOTE — Telephone Encounter (Signed)
Spoke with pt relaying Dr. G's message.  Pt verbalizes understanding and expresses his thanks.  

## 2023-04-13 NOTE — Addendum Note (Signed)
Addended by: [name] on: 04/13/2023 12:32 PM   Modules accepted: Orders

## 2023-04-13 NOTE — Telephone Encounter (Signed)
ERx I think it only comes in this tube.  New referral placed to [facility]

## 2023-04-18 ENCOUNTER — Telehealth: Payer: Self-pay | Admitting: Cardiovascular Disease

## 2023-04-18 NOTE — Telephone Encounter (Signed)
Patient advised of providers message and verbalized understanding.   Cadence David Stall, PA-C 04/14/2023  5:50 PM EDT     Echo showed improved pump function (now normal!) with mildly leaky valve. Overall looks good.     Patient states he woke up at night to use the bathroom and before he knew it he hit his head on the headboard and fell. Patient states he has had issues with his BP being low in the past. Patient advised to keep a log of his BP readings and to change positions slowly and that he should sit for a few minutes on the side of the bed before getting up. Patient denies feeling dizzy or lightheaded. Patient also advised that per Cadence last note he would be cleared for surgery if his echo results were fine and they were.

## 2023-04-18 NOTE — Telephone Encounter (Signed)
Patient is calling to follow up on his Echo results.

## 2023-04-19 ENCOUNTER — Telehealth: Payer: Self-pay | Admitting: Urology

## 2023-04-19 NOTE — Telephone Encounter (Signed)
Patient called to request refill for pain medication. He said they will only give him 4 days worth at a time, and he is out of medication.

## 2023-04-20 ENCOUNTER — Other Ambulatory Visit: Payer: Self-pay | Admitting: Urology

## 2023-04-20 ENCOUNTER — Telehealth: Payer: Self-pay

## 2023-04-20 DIAGNOSIS — N201 Calculus of ureter: Secondary | ICD-10-CM

## 2023-04-20 MED ORDER — OXYCODONE-ACETAMINOPHEN 5-325 MG PO TABS
1.0000 | ORAL_TABLET | Freq: Four times a day (QID) | ORAL | 0 refills | Status: DC | PRN
Start: 2023-04-20 — End: 2023-04-24

## 2023-04-20 NOTE — Telephone Encounter (Signed)
   Pre-operative Risk Assessment    Patient Name: Stanley Kane  DOB: 07-Feb-1966 MRN: [id number]      LAST APPOINTMENT: 03/15/23 FURTH NEXT APPOINTMENT: 04/21/23 [place]  Request for Surgical Clearance    Procedure:    Left Extracorporeal Shock Wave Lithotripsy  Date of Surgery:  Clearance 05/11/23                                 Surgeon:   Dr. Irineo Axon, MD  Surgeon's Group or Practice Name:  [facility] Phone number:  [phone] for Surgical Coordinator Fax number:  [phone]   Type of Clearance Requested:   - Medical  - Pharmacy:  Hold Rivaroxaban (Xarelto)     Type of Anesthesia:   MOD    Additional requests/questions:   N/A  Rondel Baton   04/20/2023, 11:14 AM

## 2023-04-20 NOTE — Telephone Encounter (Signed)
-----   Message from [facility] Melissa H sent at 04/19/2023  3:46 PM EDT ----- Surgical Clearance

## 2023-04-20 NOTE — Telephone Encounter (Signed)
Not sure if patient will need to be cleared again.  Per Cadence OV note on 7/17.   If echo looks good, Ok to undergo procedure. Per Pharmacy, Ok to hold Xarelto for 3 days prior to procedure. Restart Xarelto post-procedure per surgeon recommendations.    Echo results in chart and look good.

## 2023-04-20 NOTE — Progress Notes (Deleted)
Cardiology Clinic Note   Patient Name: Stanley Kane Date of Encounter: 04/20/2023  Primary Care Provider:  [name], MD Primary Cardiologist:  Julien Nordmann, MD  Patient Profile    57 y.o. male with a hx of permanent A. fib diagnosed in 2013 status post DCCV in 06/2012 with recurrent A. 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   Past Medical History    Past Medical History:  Diagnosis Date   Anxiety    Chest pain    a. 09/2016 Cor Ca2+ scoring: 0; b. 08/2017 MV: No isch/infarct. EF 33-44%; c. 12/2019 MV: No isch/infarct. EF 46%; d. 11/2021 MV: No isch/infarct. Nl LVEF. No significant Ao/Cor Ca2+.   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 09/2014 Echo: EF 60-65%, mild MR/TR; b. 10/2017 Echo: EF 45-50%, no rwma, mildly dil LA, nl RV fxn.   Obesity    OSA (obstructive sleep apnea)    Persistent atrial fibrillation (HCC)    a. dx 2013 s/p DCCV; b. recurrent Afib 2016 s/p DCCV 11/25/2014; c. 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   hospitalization [facility] - stress with father's illness   Past Surgical History:  Procedure Laterality Date   CARDIOVASCULAR STRESS TEST  ~2005   thinks at [facility], told normal   carotid US  08/2014   no plaque   COLONOSCOPY  2000   WNL, for weight loss after started gym   MRI  08/2014   brain - WNL, skull base C1/2 articulation   TONSILLECTOMY AND ADENOIDECTOMY  childhood   US ECHOCARDIOGRAPHY  08/2014   EF 60%, nl vent fxn, mildly dilated LA, mild MR, mild TR    Allergies  Allergies  Allergen Reactions   Vicodin  [Hydrocodone-Acetaminophen]     Makes him "jittery"    History of Present Illness    Mr. Weitkamp returns today for pre-operative evaluation   Home Medications    Current Outpatient Medications  Medication Sig Dispense Refill   calcipotriene (DOVONOX) 0.005 % cream Apply topically 2 (two) times daily. 120 g 3   escitalopram (LEXAPRO) 10 MG tablet Take 1 tablet (10 mg total) by mouth daily. 30 tablet 6   furosemide (LASIX) 20 MG tablet Take 1 tablet (20 mg total) by mouth as needed for edema. 90 tablet 3   metoprolol succinate (TOPROL-XL) 25 MG 24 hr tablet Take 1 tablet (25 mg total) by mouth daily. *NEED TO SCHEDULE AN OFFICE VISIT FOR FURTHER REFILLS* 30 tablet 1   oxyCODONE-acetaminophen (PERCOCET/ROXICET) 5-325 MG tablet Take 1 tablet by mouth every 6 (six) hours as needed for severe pain. 20 tablet 0   rivaroxaban (XARELTO) 20 MG TABS tablet Take 1 tablet (20 mg total) by mouth daily with supper. 30 tablet 5   triamcinolone cream (KENALOG) 0.1 % APPLY TO AFFECTED AREA TWICE A DAY AS DIRECTED 80 g 0   No current facility-administered medications for this visit.     Family History    Family History  Problem Relation Age of Onset   Hypertension Father    Coronary artery disease Father  30   Heart attack Father    Cirrhosis Father    Hypercalcemia Father    Diabetes Mother    Heart disease Mother    CAD Sister 36       deceased from MI   GI Disease Sister    Stroke Neg Hx    He indicated that his mother is alive. He indicated that his father is deceased. He indicated that his sister is deceased. He indicated that the status of his neg hx is unknown.  Social History    Social History   Socioeconomic History   Marital status: Divorced    Spouse name: Not on file   Number of children: Not on file   Years of education: Not on file   Highest education level: Not on file  Occupational History   Not on file  Tobacco Use   Smoking status: Former    Current packs/day: 0.00     Types: Cigarettes    Quit date: 08/29/1996    Years since quitting: 26.6   Smokeless tobacco: Never  Vaping Use   Vaping status: Never Used  Substance and Sexual Activity   Alcohol use: Not Currently    Alcohol/week: 2.0 standard drinks of alcohol    Types: 2 Cans of beer per week    Comment: quit beginning of 2020   Drug use: Yes    Types: Cocaine   Sexual activity: Yes  Other Topics Concern   Not on file  Social History Narrative   Occupation: truck driver, just started back   Edu: 9th grade, GED   Social Determinants of Health   Financial Resource Strain: Low Risk  (03/22/2023)   Overall Financial Resource Strain (CARDIA)    Difficulty of Paying Living Expenses: Not very hard  Food Insecurity: No Food Insecurity (03/22/2023)   Hunger Vital Sign    Worried About Running Out of Food in the Last Year: Never true    Ran Out of Food in the Last Year: Never true  Transportation Needs: No Transportation Needs (03/22/2023)   PRAPARE - Administrator, Civil Service (Medical): No    Lack of Transportation (Non-Medical): No  Physical Activity: Inactive (04/18/2019)   Exercise Vital Sign    Days of Exercise per Week: 0 days    Minutes of Exercise per Session: 0 min  Stress: Not on file  Social Connections: Not on file  Intimate Partner Violence: Low Risk  (02/18/2022)   Received from [place] [facility] [place] [facility]   History of Abuse    History of Abuse: Denies     Review of Systems    General:  No chills, fever, night sweats or weight changes.  Cardiovascular:  No chest pain, dyspnea on exertion, edema, orthopnea, palpitations, paroxysmal nocturnal dyspnea. Dermatological: No rash, lesions/masses Respiratory: No cough, dyspnea Urologic: No hematuria, dysuria Abdominal:   No nausea, vomiting, diarrhea, bright red blood per rectum, melena, or hematemesis Neurologic:  No visual changes, wkns, changes in mental status. All other systems reviewed  and are otherwise negative except as noted above.       Physical Exam    VS:  There were no vitals taken for this visit. , BMI There is no height or weight on file to calculate BMI.     GEN: Well nourished, well developed, in no acute distress. HEENT: normal. Neck: Supple, no JVD, carotid bruits, or masses. Cardiac: RRR, no murmurs, rubs, or gallops. No clubbing, cyanosis, edema.  Radials/DP/PT 2+ and equal bilaterally.  Respiratory:  Respirations regular and unlabored, clear to auscultation bilaterally. GI: Soft, nontender, nondistended, BS + x 4. MS: no deformity or atrophy. Skin: warm and dry, no rash. Neuro:  Strength and sensation are intact. Psych: Normal affect.      Lab Results  Component Value Date   WBC 10.4 02/21/2023   HGB 13.1 02/21/2023   HCT 38.1 (L) 02/21/2023   MCV 94.3 02/21/2023   PLT 313 02/21/2023   Lab Results  Component Value Date   CREATININE 0.96 03/08/2023   BUN 12 03/08/2023   NA 138 03/08/2023   K 4.0 03/08/2023   CL 98 03/08/2023   CO2 30 03/08/2023   Lab Results  Component Value Date   ALT 30 03/08/2023   AST 28 03/08/2023   ALKPHOS 49 03/08/2023   BILITOT 0.6 03/08/2023   Lab Results  Component Value Date   CHOL 169 03/08/2023   HDL 40.80 03/08/2023   LDLCALC 103 (H) 09/08/2015   LDLDIRECT 89.0 03/08/2023   TRIG 425.0 (H) 03/08/2023   CHOLHDL 4 03/08/2023    Lab Results  Component Value Date   HGBA1C 5.9 03/08/2023     Review of Prior Studies     Expand All Collapse All   Cardiology Office Note:     Date:  03/15/2023    ID:  Dorien Chihuahua, DOB 01-17-1966, MRN [id number]   PCP:  [name], MD    [facility] Cardiologist:  Julien Nordmann, MD  [facility] Electrophysiologist:  Lanier Prude, MD    Referring MD: [name], MD    Chief Complaint: pre-op evaluation   History of Present Illness:     XAVEON SHILTZ is a 57 y.o. male with a hx of permanent A. fib diagnosed in 2013 status  post DCCV in 06/2012 with recurrent A. fib status post repeat DCCV in 10/2014 with subsequent recurrence on Xarelto, dilated cardiomyopathy, mild mitral regurgitation, pre-diabetes, GAD, remote cocaine use, and obesity who presents for follow-up.   Prior echo from 08/2014 showed an EF of 60-65%, mild MR, mild TR, normal RVSF and PASP.  He was noted to be back in Afib in 2016 in the setting of medication noncompliance. Following resumption of medications, he underwent briefly successful DCCV on 11/25/2014. He was again noted to be back in Afib in 12/2014 and has remained in Afib in subsequent office visits leading to the discontinuation of flecainide and rate control strategy in 09/2016. He underwent calcium scoring in 09/2016 given atypical chest pain with a score of 0.   He underwent nuclear stress testing, in 08/2017, to evaluate for high risk ischemia (with plans to consider rhythm control strategy) that was negative for evidence of ischemia or scar. EF was reduced, felt to be underestimated given Afib. We followed this up with an echo in 10/2017 that showed an EF of 45-50%, no RWMA, mildly dilated left atrium measuring 42 mm, RVSF and PASP were normal.  He was seen in 11/2019 needing annual follow-up for DOT clearance.  He was doing well from a cardiac perspective.  He was noted to not be taking Xarelto due to financial issues.  She underwent Lexiscan MPI in 12/2019 which was a low risk and without evidence of ischemia.  LVEF was mildly decreased at 46%, though visually appeared to be normal.  Echo was recommended to confirm EF, though remains pending.  He was last seen in the office in 12/2019 and was doing well from  a cardiac perspective.  He was noted to be tachycardic with a rate of 125, though reported he was in a hurry and had not yet taken his medications that day.  He was cleared for DOT driving.   The patient was last seen 11/2021 for DOT clearance. Myoview lexiscan was low risk, normal perfusion, normal LVEF,  no significant coronary calcifications.   The patient presents for pre-op visit for kidney stone. Procedure scheduled for the 25th. The patient has a lot of anxiety surrounding life and negative life events. He is going to start anxiety medications. 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 Things are still financially hard.    The patient denies chest pain. He has SOB on exertion and palpitations from anxiety. No lower leg edema on exam. He takes lasix as needed for extra fluid. He is in rate controlled Afib. He takes Xarelto, no bleeding issues with this. Pharmacy instructions recommend holding Xarelto up to 3 days prior to procedure. Recent labs reviewed.        Past Medical History:  Diagnosis Date   Anxiety     Chest pain      a. 09/2016 Cor Ca2+ scoring: 0; b. 08/2017 MV: No isch/infarct. EF 33-44%; c. 12/2019 MV: No isch/infarct. EF 46%; d. 11/2021 MV: No isch/infarct. Nl LVEF. No significant Ao/Cor Ca2+.   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 09/2014 Echo: EF 60-65%, mild MR/TR; b. 10/2017 Echo: EF 45-50%, no rwma, mildly dil LA, nl RV fxn.   Obesity     OSA (obstructive sleep apnea)     Persistent atrial fibrillation (HCC)      a. dx 2013 s/p DCCV; b. recurrent Afib 2016 s/p DCCV 11/25/2014; c. 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    hospitalization [facility] - stress with father's illness               Past Surgical History:  Procedure Laterality Date   CARDIOVASCULAR STRESS TEST   ~2005    thinks at [facility], told normal   carotid US   08/2014    no plaque   COLONOSCOPY   2000    WNL, for weight loss after started gym   MRI   08/2014    brain - WNL, skull base C1/2 articulation   TONSILLECTOMY AND ADENOIDECTOMY   childhood   US ECHOCARDIOGRAPHY    08/2014    EF 60%, nl vent fxn, mildly dilated LA, mild MR, mild TR          Current Medications: Active Medications      Current Meds  Medication Sig   calcipotriene (DOVONOX) 0.005 % cream Apply topically 2 (two) times daily.   digoxin (LANOXIN) 0.125 MG tablet Take 1 tablet (125 mcg total) by mouth daily. * NEED TO SCHEDULE AN OFFICE VISIT FOR FURTHER REFILLS*   escitalopram (LEXAPRO) 10 MG tablet Take 1 tablet (10 mg total) by mouth daily.   furosemide (LASIX) 20 MG tablet Take 1 tablet (20 mg total) by mouth as needed for edema.   metoprolol succinate (TOPROL-XL) 25 MG 24 hr tablet Take 1 tablet (25 mg total) by mouth daily. *NEED TO SCHEDULE AN OFFICE VISIT FOR  FURTHER REFILLS*   oxyCODONE-acetaminophen (PERCOCET/ROXICET) 5-325 MG tablet Take 1 tablet by mouth every 6 (six) hours as needed for severe pain.   rivaroxaban (XARELTO) 20 MG TABS tablet Take 1 tablet (20 mg total) by mouth daily with supper.   triamcinolone cream (KENALOG) 0.1 % APPLY TO AFFECTED AREA TWICE A DAY AS DIRECTED        Allergies:   Vicodin [hydrocodone-acetaminophen]    Social History         Socioeconomic History   Marital status: Divorced      Spouse name: Not on file   Number of children: Not on file   Years of education: Not on file   Highest education level: Not on file  Occupational History   Not on file  Tobacco Use   Smoking status: Former      Current packs/day: 0.00      Types: Cigarettes      Quit date: 08/29/1996      Years since quitting: 26.5   Smokeless tobacco: Never  Vaping Use   Vaping status: Never Used  Substance and Sexual Activity   Alcohol use: Not Currently      Alcohol/week: 2.0 standard drinks of alcohol      Types: 2 Cans of beer per week      Comment: quit beginning of 2020   Drug use: Yes      Types: Cocaine   Sexual activity: Yes  Other Topics Concern   Not on file  Social History Narrative    Occupation: truck driver, just started back    Edu: 9th grade,  GED    Social Determinants of Health        Financial Resource Strain: Not on file  Food Insecurity: Not on file  Transportation Needs: Not on file  Physical Activity: Inactive (04/18/2019)    Exercise Vital Sign     Days of Exercise per Week: 0 days     Minutes of Exercise per Session: 0 min  Stress: Not on file  Social Connections: Not on file      Family History: The patient's family history includes CAD (age of onset: 64) in his sister; Cirrhosis in his father; Coronary artery disease (age of onset: 39) in his father; Diabetes in his mother; GI Disease in his sister; Heart attack in his father; Heart disease in his mother; Hypercalcemia in his father; Hypertension in his father. There is no history of Stroke.   ROS:   Please see the history of present illness.     All other systems reviewed and are negative.   EKGs/Labs/Other Studies Reviewed:      Echo 04/13/2023 1. Left ventricular ejection fraction, by estimation, is 60 to 65%. The  left ventricle has normal function. The left ventricle has no regional  wall motion abnormalities. Left ventricular diastolic parameters are  indeterminate.   2. Right ventricular systolic function is normal. The right ventricular  size is normal. Tricuspid regurgitation signal is inadequate for assessing  PA pressure.   3. Left atrial size was moderately dilated.   4. The mitral valve is normal in structure. Mild mitral valve  regurgitation. No evidence of mitral stenosis.   5. The aortic valve is tricuspid. Aortic valve regurgitation is not  visualized. No aortic stenosis is present.   6. The inferior vena cava is normal in size with greater than 50%  respiratory variability, suggesting right atrial pressure of 3 mmHg.   The following studies were reviewed today:  Myoview Lexiscan 11/2021    The study is normal. The study is low risk.   No ST deviation was noted.   LV perfusion is normal. There is no evidence of ischemia. There is no  evidence of infarction.   Left ventricular function is normal. End diastolic cavity size is normal. End systolic cavity size is normal.   CT attenuation images with no significant aortic or coronary calcifications.   Myoview lexiscan 2021 Narrative & Impression  The study is normal. This is a low risk study. The left ventricular ejection fraction is mildly decreased (46%) on measurment. Although visually, EF appears normal. Recommend comfirming EF with Echo There is no evidence for ischemia    Echo 2019 Study Conclusions   - Left ventricle: The cavity size was normal. Systolic function was    mildly reduced. The estimated ejection fraction was in the range    of 45% to 50%. Wall motion was normal; there were no regional    wall motion abnormalities. The study is not technically    sufficient to allow evaluation of LV diastolic function.  - Left atrium: The atrium was mildly dilated.  - Right ventricle: Systolic function was normal.  - Pulmonary arteries: Systolic pressure was within the normal    range.   Impressions:   - Atrial fibrillation rate 100 to 120 bpm.     Assessment & Plan   1.  ***     {Are you ordering a CV Procedure (e.g. stress test, cath, DCCV, TEE, etc)?   Press F2        :[id number]}   Signed, Bettey Mare. Liborio Nixon, ANP, AACC   04/20/2023 7:10 AM      Office [phone] Fax [phone]  Notice: This dictation was prepared with Dragon dictation along with smaller phrase technology. Any transcriptional errors that result from this process are unintentional and may not be corrected upon review.

## 2023-04-20 NOTE — Telephone Encounter (Addendum)
   Name: Stanley Kane  DOB: 06-02-1966  MRN: [id number]  Primary Cardiologist: Julien Nordmann, MD  Please disregard previous clearance request.  Napoleon Form, Leodis Rains, NP  04/20/2023, 11:27 AM

## 2023-04-20 NOTE — Telephone Encounter (Signed)
Called and spoke to Patient and he already has a follow up appt scheduled at [facility] line to discuss syncope due to availability in our office. Patient asked about his surgical clearance and advised that he has been cleared since his echo came back fine.

## 2023-04-20 NOTE — Telephone Encounter (Signed)
Notified patient as instructed,.  

## 2023-04-21 ENCOUNTER — Ambulatory Visit: Payer: Medicaid Other | Admitting: Medical

## 2023-04-21 ENCOUNTER — Ambulatory Visit: Payer: Medicaid Other | Admitting: Adult Health

## 2023-04-21 ENCOUNTER — Telehealth: Payer: Self-pay

## 2023-04-21 NOTE — Telephone Encounter (Signed)
Called patient regarding to reschedule appointment. Appointment rescheduled for September 3rd 2:45

## 2023-04-21 NOTE — Telephone Encounter (Signed)
Recommendation remains the same. Pt can hold Xarelto 2-3 days prior to procedure.

## 2023-04-21 NOTE — Telephone Encounter (Signed)
   Patient Name: Stanley Kane  DOB: [birth year] MRN: [id number]  Primary Cardiologist: Julien Nordmann, MD  Chart reviewed as part of pre-operative protocol coverage. Given past medical history and time since last visit, based on ACC/AHA guidelines, Stanley Kane is at acceptable risk for the planned procedure without further cardiovascular testing.  Please see previous notes office visit note and echocardiogram results.  Per pharmacy recommendation patient can hold Xarelto 2 to 3 days prior to procedure and should restart postprocedure when surgically instructed.  The patient was advised that if he develops new symptoms prior to surgery to contact our office to arrange for a follow-up visit, and he verbalized understanding.  I will route this recommendation to the requesting party via Epic fax function and remove from pre-op pool.  Please call with questions.  Napoleon Form, Leodis Rains, NP 04/21/2023, 11:33 AM

## 2023-04-21 NOTE — Progress Notes (Deleted)
Cardiology Office Note:    Date:  04/21/2023   ID:  Stanley Kane, DOB 10-Nov-1965, MRN [id number]  PCP:  [name], MD  [facility] Cardiologist:  Julien Nordmann, MD  [facility] Electrophysiologist:  Lanier Prude, MD   Referring MD: [name], MD   Chief Complaint: ***  History of Present Illness:    Stanley Kane is a 57 y.o. male with a hx of permanent A. fib diagnosed in 2013 status post DCCV in 06/2012 with recurrent A. fib status post repeat DCCV in 10/2014 with subsequent recurrence on Xarelto, dilated cardiomyopathy, mild mitral regurgitation, pre-diabetes, GAD, remote cocaine use, and obesity who presents for follow-up.   Prior echo from 08/2014 showed an EF of 60-65%, mild MR, mild TR, normal RVSF and PASP.  He was noted to be back in Afib in 2016 in the setting of medication noncompliance. Following resumption of medications, he underwent briefly successful DCCV on 11/25/2014. He was again noted to be back in Afib in 12/2014 and has remained in Afib in subsequent office visits leading to the discontinuation of flecainide and rate control strategy in 09/2016. He underwent calcium scoring in 09/2016 given atypical chest pain with a score of 0.   He underwent nuclear stress testing, in 08/2017, to evaluate for high risk ischemia (with plans to consider rhythm control strategy) that was negative for evidence of ischemia or scar. EF was reduced, felt to be underestimated given Afib. We followed this up with an echo in 10/2017 that showed an EF of 45-50%, no RWMA, mildly dilated left atrium measuring 42 mm, RVSF and PASP were normal.  He was seen in 11/2019 needing annual follow-up for DOT clearance.  He was doing well from a cardiac perspective.  He was noted to not be taking Xarelto due to financial issues.  She underwent Lexiscan MPI in 12/2019 which was a low risk and without evidence of ischemia.  LVEF was mildly decreased at 46%, though visually appeared to be normal.   Echo was recommended to confirm EF, though remains pending.  He was last seen in the office in 12/2019 and was doing well from a cardiac perspective.  He was noted to be tachycardic with a rate of 125, though reported he was in a hurry and had not yet taken his medications that day.  He was cleared for DOT driving.   The patient was last seen 11/2021 for DOT clearance. Myoview lexiscan was low risk, normal perfusion, normal LVEF, no significant coronary calcifications.   The patient was last seen 03/15/23 for pre-procedure evaluation. He reported DOE and echo was repeated. Echo showed improved LVEF 60-65%.    Past Medical History:  Diagnosis Date   Anxiety    Chest pain    a. 09/2016 Cor Ca2+ scoring: 0; b. 08/2017 MV: No isch/infarct. EF 33-44%; c. 12/2019 MV: No isch/infarct. EF 46%; d. 11/2021 MV: No isch/infarct. Nl LVEF. No significant Ao/Cor Ca2+.   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 09/2014 Echo: EF 60-65%, mild MR/TR; b. 10/2017 Echo: EF 45-50%, no rwma, mildly dil LA, nl RV fxn.   Obesity    OSA (obstructive sleep apnea)    Persistent atrial fibrillation (HCC)    a. dx 2013 s/p DCCV; b. recurrent Afib 2016  s/p DCCV 11/25/2014; c. 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   hospitalization [facility] - stress with father's illness    Past Surgical History:  Procedure Laterality Date   CARDIOVASCULAR STRESS TEST  ~2005   thinks at [facility], told normal   carotid US  08/2014   no plaque   COLONOSCOPY  2000   WNL, for weight loss after started gym   MRI  08/2014   brain - WNL, skull base C1/2 articulation   TONSILLECTOMY AND ADENOIDECTOMY  childhood   US ECHOCARDIOGRAPHY  08/2014   EF 60%, nl vent fxn, mildly dilated LA, mild MR, mild TR    Current Medications: No outpatient medications  have been marked as taking for the 04/21/23 encounter (Appointment) with Fransico Michael, Avid Guillette H, PA-C.     Allergies:   Vicodin [hydrocodone-acetaminophen]   Social History   Socioeconomic History   Marital status: Divorced    Spouse name: Not on file   Number of children: Not on file   Years of education: Not on file   Highest education level: Not on file  Occupational History   Not on file  Tobacco Use   Smoking status: Former    Current packs/day: 0.00    Types: Cigarettes    Quit date: 08/29/1996    Years since quitting: 26.6   Smokeless tobacco: Never  Vaping Use   Vaping status: Never Used  Substance and Sexual Activity   Alcohol use: Not Currently    Alcohol/week: 2.0 standard drinks of alcohol    Types: 2 Cans of beer per week    Comment: quit beginning of 2020   Drug use: Yes    Types: Cocaine   Sexual activity: Yes  Other Topics Concern   Not on file  Social History Narrative   Occupation: truck driver, just started back   Edu: 9th grade, GED   Social Determinants of Health   Financial Resource Strain: Low Risk  (03/22/2023)   Overall Financial Resource Strain (CARDIA)    Difficulty of Paying Living Expenses: Not very hard  Food Insecurity: No Food Insecurity (03/22/2023)   Hunger Vital Sign    Worried About Running Out of Food in the Last Year: Never true    Ran Out of Food in the Last Year: Never true  Transportation Needs: No Transportation Needs (03/22/2023)   PRAPARE - Administrator, Civil Service (Medical): No    Lack of Transportation (Non-Medical): No  Physical Activity: Inactive (04/18/2019)   Exercise Vital Sign    Days of Exercise per Week: 0 days    Minutes of Exercise per Session: 0 min  Stress: Not on file  Social Connections: Not on file     Family History: The patient's ***family history includes CAD (age of onset: 38) in his sister; Cirrhosis in his father; Coronary artery disease (age of onset: 3) in his father; Diabetes in his  mother; GI Disease in his sister; Heart attack in his father; Heart disease in his mother; Hypercalcemia in his father; Hypertension in his father. There is no history of Stroke.  ROS:   Please see the history of present illness.    *** All other systems reviewed and are negative.  EKGs/Labs/Other Studies Reviewed:    The following studies were reviewed today: ***  EKG:  EKG is *** ordered today.  The ekg ordered today demonstrates ***  Recent Labs: 10/12/2022: B Natriuretic Peptide 162.2 02/21/2023: Hemoglobin 13.1; Platelets 313 03/08/2023:  ALT 30; BUN 12; Creatinine, Ser 0.96; Potassium 4.0; Sodium 138; TSH 2.28  Recent Lipid Panel    Component Value Date/Time   CHOL 169 03/08/2023 0906   CHOL 157 09/25/2014 1731   CHOL 175 10/10/2011 0000   TRIG 425.0 (H) 03/08/2023 0906   TRIG 250 (H) 09/25/2014 1731   TRIG 93 10/10/2011 0000   HDL 40.80 03/08/2023 0906   HDL 33 (L) 09/25/2014 1731   CHOLHDL 4 03/08/2023 0906   VLDL 41.2 (H) 12/27/2021 1218   VLDL 50 (H) 09/25/2014 1731   LDLCALC 103 (H) 09/08/2015 1036   LDLCALC 74 09/25/2014 1731   LDLDIRECT 89.0 03/08/2023 0906     Risk Assessment/Calculations:   {Does this patient have ATRIAL FIBRILLATION?:[phone]}   Physical Exam:    VS:  There were no vitals taken for this visit.    Wt Readings from Last 3 Encounters:  03/15/23 226 lb (102.5 kg)  03/15/23 223 lb 6 oz (101.3 kg)  02/24/23 229 lb (103.9 kg)     GEN: *** Well nourished, well developed in no acute distress HEENT: Normal NECK: No JVD; No carotid bruits LYMPHATICS: No lymphadenopathy CARDIAC: ***RRR, no murmurs, rubs, gallops RESPIRATORY:  Clear to auscultation without rales, wheezing or rhonchi  ABDOMEN: Soft, non-tender, non-distended MUSCULOSKELETAL:  No edema; No deformity  SKIN: Warm and dry NEUROLOGIC:  Alert and oriented x 3 PSYCHIATRIC:  Normal affect   ASSESSMENT:    No diagnosis found. PLAN:    In order of problems listed  above:  ***  Disposition: Follow up {follow up:15908} with ***   Shared Decision Making/Informed Consent   {Are you ordering a CV Procedure (e.g. stress test, cath, DCCV, TEE, etc)?   Press F2        :[id number]}    Signed, Eldra Word David Stall, PA-C  04/21/2023 1:20 PM    [facility]

## 2023-04-24 ENCOUNTER — Telehealth: Payer: Self-pay

## 2023-04-24 ENCOUNTER — Ambulatory Visit
Admission: RE | Admit: 2023-04-24 | Discharge: 2023-04-24 | Disposition: A | Discharge status: Home / Self Care | Payer: Medicaid Other | Source: Ambulatory Visit | Attending: Urology | Admitting: Urology

## 2023-04-24 ENCOUNTER — Ambulatory Visit
Admission: RE | Admit: 2023-04-24 | Discharge: 2023-04-24 | Disposition: A | Discharge status: Home / Self Care | Payer: Medicaid Other | Attending: Urology | Admitting: Urology

## 2023-04-24 ENCOUNTER — Other Ambulatory Visit: Payer: Self-pay

## 2023-04-24 ENCOUNTER — Other Ambulatory Visit: Payer: Self-pay | Admitting: Urology

## 2023-04-24 DIAGNOSIS — N201 Calculus of ureter: Secondary | ICD-10-CM

## 2023-04-24 DIAGNOSIS — N200 Calculus of kidney: Secondary | ICD-10-CM | POA: Insufficient documentation

## 2023-04-24 MED ORDER — OXYCODONE-ACETAMINOPHEN 5-325 MG PO TABS: 1.0000 | ORAL_TABLET | Freq: Four times a day (QID) | ORAL | 0 refills | Status: DC | PRN

## 2023-04-24 NOTE — Telephone Encounter (Signed)
Patient left a message on triage line stating that his OV with cardiologist that was scheduled for today for pre op clearance before he can have kidney stone surgery was cancelled by their office and r/s to 05/03/23. Patient would like to know if he still needs to come in tomorrow for his paperwork or wait until next month?  Patient also asked for refill on pain medication since everything is getting pushed out. His last RX was for shorter amount of days. Please review.

## 2023-04-24 NOTE — Telephone Encounter (Signed)
Patient advised, order placed and patient will come not to get this done.

## 2023-04-24 NOTE — Telephone Encounter (Signed)
Patient wanted to know if we would be able to refill his pain medication

## 2023-04-24 NOTE — Telephone Encounter (Signed)
Patient advised.

## 2023-04-27 ENCOUNTER — Telehealth: Payer: Self-pay | Admitting: Family Medicine

## 2023-04-27 NOTE — Telephone Encounter (Signed)
Patient contacted the office regarding referral to [facility]. States he has had a lot of issues with scheduling, the office is explaining to him that they will not accept medicaid. Patient wants to know if a new referral could be placed to [facility], as long as they accept [employer].

## 2023-04-28 ENCOUNTER — Other Ambulatory Visit: Payer: Self-pay | Admitting: *Deleted

## 2023-04-28 DIAGNOSIS — N201 Calculus of ureter: Secondary | ICD-10-CM

## 2023-04-28 NOTE — Progress Notes (Unsigned)
Cardiology Office Note:  .   Date:  05/03/2023  ID:  Stanley Kane, DOB Aug 19, 1966, MRN [id number] PCP: [name], MD  [facility] Providers Cardiologist:  Julien Nordmann, MD Electrophysiologist:  Lanier Prude, MD    History of Present Illness: .   Stanley Kane is a 57 y.o. male with a hx of permanent A. fib diagnosed in 2013 status post DCCV in 06/2012 with recurrent A. fib status post repeat DCCV in 10/2014 with subsequent recurrence on Xarelto, dilated cardiomyopathy, mild mitral regurgitation, pre-diabetes, GAD, remote cocaine use, and obesity who presents for follow-up.   Prior echo from 08/2014 showed an EF of 60-65%, mild MR, mild TR, normal RVSF and PASP.  He was noted to be back in Afib in 2016 in the setting of medication noncompliance. Following resumption of medications, he underwent briefly successful DCCV on 11/25/2014. He was again noted to be back in Afib in 12/2014 and has remained in Afib in subsequent office visits leading to the discontinuation of flecainide and rate control strategy in 09/2016. He underwent calcium scoring in 09/2016 given atypical chest pain with a score of 0.   He underwent nuclear stress testing, in 08/2017, to evaluate for high risk ischemia (with plans to consider rhythm control strategy) that was negative for evidence of ischemia or scar. EF was reduced, felt to be underestimated given Afib. We followed this up with an echo in 10/2017 that showed an EF of 45-50%, no RWMA, mildly dilated left atrium measuring 42 mm, RVSF and PASP were normal.  He was seen in 11/2019 needing annual follow-up for DOT clearance.  He was doing well from a cardiac perspective.  He was noted to not be taking Xarelto due to financial issues.  She underwent Lexiscan MPI in 12/2019 which was a low risk and without evidence of ischemia.  LVEF was mildly decreased at 46%, though visually appeared to be normal.  Echo was recommended to confirm EF, though remains pending.  He was  last seen in the office in 12/2019 and was doing well from a cardiac perspective.  He was noted to be tachycardic with a rate of 125, though reported he was in a hurry and had not yet taken his medications that day.  He was cleared for DOT driving.   The patient was seen 11/2021 for DOT clearance. Myoview lexiscan was low risk, normal perfusion, normal LVEF, no significant coronary calcifications.   The patient was last seen 03/15/23 for pre-procedure evaluation. He reported DOE and echo was repeated. Repeat echo on 04/13/2023 showed LVEF of 60 to 65%, LV with normal function and no RWMA.  There is mild mitral valve regurgitation.  His digoxin level was also checked at that time and being subtherapeutic it was recommended that he try coming off of the medication.  Today he presents regarding preoperative clearance and syncopal event.  Patient reports that a few weeks ago he was getting out of the bed from a deep sleep around 2 AM to use the restroom.  Notes he took a few steps then started to fall.  Notes that he hit the footboard of his bed which jostled him to awareness.  He is unsure if he felt dizzy or lightheaded prior to event as he was coming out of a deep sleep.  He denies any further episodes of presyncope or syncope.  Notes that he has had increased fatigue and some slight dyspnea on exertion the last few weeks.  He denies chest  pain, lower extremity edema or palpitations.  ROS:  Today he denies chest pain, lower extremity edema, palpitations, melena, hemoptysis, diaphoresis, weakness, presyncope, syncope, orthopnea, and PND.   Studies Reviewed: Marland Kitchen   EKG Interpretation Date/Time:  Wednesday May 03 2023 13:37:05 EDT Ventricular Rate:  115 PR Interval:    QRS Duration:  78 QT Interval:  310 QTC Calculation: 428 R Axis:   44  Text Interpretation: Atrial fibrillation with rapid ventricular response When compared with ECG of 15-Mar-2023 13:58, No significant change was found Confirmed by  Reather Littler [phone]) on 05/03/2023 2:01:27 PM    Risk Assessment/Calculations:    CHA2DS2-VASc Score = 1   This indicates a 0.6% annual risk of stroke. The patient's score is based upon: CHF History: 1 HTN History: 0 Diabetes History: 0 Stroke History: 0 Vascular Disease History: 0 Age Score: 0 Gender Score: 0             Physical Exam:   VS:  BP 92/64   Pulse (!) 115   Ht 5\' 7"  (1.702 m)   Wt 231 lb 9.6 oz (105.1 kg)   SpO2 97%   BMI 36.27 kg/m    Wt Readings from Last 3 Encounters:  05/03/23 231 lb 9.6 oz (105.1 kg)  03/15/23 226 lb (102.5 kg)  03/15/23 223 lb 6 oz (101.3 kg)    GEN: Well nourished, well developed in no acute distress NECK: No JVD; No carotid bruits CARDIAC: Irregular rate and rhythm, no murmurs, rubs, gallops RESPIRATORY:  Clear to auscultation without rales, wheezing or rhonchi  ABDOMEN: Soft, non-tender, non-distended EXTREMITIES:  No edema; No deformity   ASSESSMENT AND PLAN: .    Syncopal event/Hypotension: Patient reports a syncopal event a few weeks ago.  He denies any further presyncope or syncopal events.  He noted that it was around 2 AM and he had awoken from a deep sleep and needed to use the restroom, stood up quickly walked a few steps and started to fall. He noted he has been getting dizzy with position changes at that time. Discussed with patient that given syncopal event per DMV guidelines he is not supposed to drive for 6 months, patient expresses understanding.  Discussed possible etiology of syncopal event such as his low blood pressures and A-fib.  His blood pressure today is 92/64, patient notes similar readings at home.  He notes history of hypotension.  Currently denies any dizziness or lightheadedness.  Discussed that if he has any further syncopal events that he should present to the emergency room for evaluation.  Reviewed further ED precautions. Recommended slow position changes. Will restart him on his prior dose of digoxin in hopes  with better rate control his blood pressure will improve.  Check CBC, BMET and mag level today.   HFmrEF: Echo in 2019 showed LVEF 45 to 50%.  Subsequent EF on MPI showed normal LVEF. Repeat echo on 04/13/2023 showed LVEF of 60 to 65%, LV with normal function and no RWMA.  There is mild mitral valve regurgitation. He appears euvolemic and well compensated on exam. Low BP limiting GDMT Continue BB therapy.   Permanent afib: Today EKG shows afib with rvr at 115 bpm.  Digoxin was previously stopped in July, since then patient notes increased fatigue.  He denies feelings of palpitations. Will restart digoxin 0.125 mg daily.  He will have digoxin level checked in 6 to 10 days. CHA2DS2-VASc Score = 1 [CHF History: 1, HTN History: 0, Diabetes History: 0, Stroke History: 0,  Vascular Disease History: 0, Age Score: 0, Gender Score: 0].  Therefore, the patient's annual risk of stroke is 0.6 %.  Patient denies increased bleeding problems on Xarelto, does note rare blood in urine related to kidney stone. Reviewed ED precautions. Start digoxin 0.125 mg daily. Continue Toprol and Xarelto. Check CBC, BMET and magnesium today.   Pre-op evaluation: Left extracorporeal shock wave lithotripsy with Dr. Lonna Cobb.  Patient presented today regarding syncopal event a few weeks ago.  He is known to be in permanent A-fib although today he presents with rapid ventricular response.  At this time cannot provide surgical clearance.  Will restart him on digoxin 0.125 mg daily for rate control, and have him follow-up with Charlsie Quest NP on May 12, 2023 for reassessment, if rate controlled at time should be at acceptable risk for procedure. Per pharmacy patient can hold Xarelto 2-3 days prior to procedure.        Dispo: Follow up with Charlsie Quest, NP as planned on 05/12/23.   Signed, Rip Harbour, NP

## 2023-05-02 ENCOUNTER — Ambulatory Visit: Payer: Medicaid Other | Admitting: Adult Health

## 2023-05-02 NOTE — Telephone Encounter (Signed)
His appointment is tomorrow 05/03/23 @ 9:30 with [facility].

## 2023-05-03 ENCOUNTER — Encounter: Payer: Self-pay | Admitting: General Practice

## 2023-05-03 ENCOUNTER — Other Ambulatory Visit: Payer: Self-pay | Admitting: Urology

## 2023-05-03 ENCOUNTER — Ambulatory Visit: Payer: Medicaid Other | Attending: Medical | Admitting: Cardiology

## 2023-05-03 VITALS — BP 92/64 | HR 115 | Ht 67.0 in | Wt 231.6 lb

## 2023-05-03 DIAGNOSIS — L400 Psoriasis vulgaris: Secondary | ICD-10-CM | POA: Diagnosis not present

## 2023-05-03 DIAGNOSIS — I4891 Unspecified atrial fibrillation: Secondary | ICD-10-CM

## 2023-05-03 DIAGNOSIS — Z01810 Encounter for preprocedural cardiovascular examination: Secondary | ICD-10-CM | POA: Diagnosis not present

## 2023-05-03 DIAGNOSIS — I5032 Chronic diastolic (congestive) heart failure: Secondary | ICD-10-CM

## 2023-05-03 DIAGNOSIS — I4821 Permanent atrial fibrillation: Secondary | ICD-10-CM | POA: Diagnosis not present

## 2023-05-03 DIAGNOSIS — R21 Rash and other nonspecific skin eruption: Secondary | ICD-10-CM | POA: Diagnosis not present

## 2023-05-03 DIAGNOSIS — Z79899 Other long term (current) drug therapy: Secondary | ICD-10-CM | POA: Diagnosis not present

## 2023-05-03 DIAGNOSIS — N201 Calculus of ureter: Secondary | ICD-10-CM

## 2023-05-03 DIAGNOSIS — R55 Syncope and collapse: Secondary | ICD-10-CM | POA: Diagnosis not present

## 2023-05-03 MED ORDER — DIGOXIN 125 MCG PO TABS: 0.1250 mg | ORAL_TABLET | Freq: Every day | ORAL | 1 refills | Status: DC

## 2023-05-03 NOTE — Patient Instructions (Addendum)
Medication Instructions:  RESTART DIGOXIN 0.125 *If you need a refill on your cardiac medications before your next appointment, please call your pharmacy*   Lab Work: [employer] AND BMET TODAY DIGOXIN LEVEL BEFORE MEDICATIONS IN 6-10 DAYS If you have labs (blood work) drawn today and your tests are completely normal, you will receive your results only by:  MyChart Message (if you have MyChart) OR  A paper copy in the mail If you have any lab test that is abnormal or we need to change your treatment, we will call you to review the results.   Testing/Procedures: NONE  Follow-Up: At [facility], you and your health needs are our priority.  As part of our continuing mission to provide you with exceptional heart care, we have created designated Provider Care Teams.  These Care Teams include your primary Cardiologist (physician) and Advanced Practice Providers (APPs -  Physician Assistants and Nurse Practitioners) who all work together to provide you with the care you need, when you need it.  Your next appointment:   05-12-2023 t 8:25 AM   Provider:   Lavonna Rua [place]     Other Instructions

## 2023-05-03 NOTE — Telephone Encounter (Signed)
Did he keep below appt? [facility] scheduled 05/03/23 @ 9:30 am.

## 2023-05-04 LAB — MAGNESIUM: Magnesium: 2 mg/dL (ref 1.6–2.3)

## 2023-05-04 LAB — BASIC METABOLIC PANEL
BUN/Creatinine Ratio: 15 (ref 9–20)
BUN: 13 mg/dL (ref 6–24)
CO2: 27 mmol/L (ref 20–29)
Calcium: 9.7 mg/dL (ref 8.7–10.2)
Chloride: 99 mmol/L (ref 96–106)
Creatinine, Ser: 0.85 mg/dL (ref 0.76–1.27)
Glucose: 98 mg/dL (ref 70–99)
Potassium: 5 mmol/L (ref 3.5–5.2)
Sodium: 139 mmol/L (ref 134–144)
eGFR: 102 mL/min/{1.73_m2} (ref >59)

## 2023-05-04 LAB — CBC
Hematocrit: 43.9 % (ref 37.5–51.0)
Hemoglobin: 14.6 g/dL (ref 13.0–17.7)
MCH: 32.1 pg (ref 26.6–33.0)
MCHC: 33.3 g/dL (ref 31.5–35.7)
MCV: 97 fL (ref 79–97)
Platelets: 364 10*3/uL (ref 150–450)
RBC: 4.55 x10E6/uL (ref 4.14–5.80)
RDW: 12.7 % (ref 11.6–15.4)
WBC: 9.5 10*3/uL (ref 3.4–10.8)

## 2023-05-04 MED ORDER — OXYCODONE-ACETAMINOPHEN 5-325 MG PO TABS: 1.0000 | ORAL_TABLET | Freq: Four times a day (QID) | ORAL | 0 refills | Status: DC | PRN

## 2023-05-04 NOTE — Telephone Encounter (Signed)
Lvm asking pt to call back. Need to find out if pt kept 05/03/23 appt with [facility].

## 2023-05-04 NOTE — Telephone Encounter (Signed)
Noted.  Fyi to Dr. G.  

## 2023-05-04 NOTE — Telephone Encounter (Signed)
Pt called back stating he did keep appt & everything has been taken care of. Call back # [phone]

## 2023-05-10 ENCOUNTER — Other Ambulatory Visit (HOSPITAL_COMMUNITY): Payer: Self-pay

## 2023-05-10 ENCOUNTER — Other Ambulatory Visit: Payer: Self-pay

## 2023-05-11 ENCOUNTER — Telehealth: Payer: Self-pay

## 2023-05-11 DIAGNOSIS — I4821 Permanent atrial fibrillation: Secondary | ICD-10-CM | POA: Diagnosis not present

## 2023-05-11 DIAGNOSIS — H524 Presbyopia: Secondary | ICD-10-CM | POA: Diagnosis not present

## 2023-05-11 DIAGNOSIS — Z79899 Other long term (current) drug therapy: Secondary | ICD-10-CM | POA: Diagnosis not present

## 2023-05-11 NOTE — Telephone Encounter (Addendum)
Pt called triage line, stating he has an appointment with his cardiology tomorrow, to determine if he would get cleared for surgery for his kidney stones. Pt asked if he could get a refill on oxycodone. Please advise.

## 2023-05-12 ENCOUNTER — Ambulatory Visit: Payer: Medicaid Other | Attending: Cardiology | Admitting: Cardiology

## 2023-05-12 ENCOUNTER — Encounter: Payer: Self-pay | Admitting: Cardiology

## 2023-05-12 ENCOUNTER — Other Ambulatory Visit: Payer: Self-pay | Admitting: Urology

## 2023-05-12 VITALS — BP 115/70 | HR 101 | Ht 68.0 in | Wt 234.6 lb

## 2023-05-12 DIAGNOSIS — I5032 Chronic diastolic (congestive) heart failure: Secondary | ICD-10-CM | POA: Diagnosis not present

## 2023-05-12 DIAGNOSIS — Z01810 Encounter for preprocedural cardiovascular examination: Secondary | ICD-10-CM

## 2023-05-12 DIAGNOSIS — R55 Syncope and collapse: Secondary | ICD-10-CM

## 2023-05-12 DIAGNOSIS — I959 Hypotension, unspecified: Secondary | ICD-10-CM | POA: Diagnosis not present

## 2023-05-12 DIAGNOSIS — I4891 Unspecified atrial fibrillation: Secondary | ICD-10-CM

## 2023-05-12 DIAGNOSIS — N201 Calculus of ureter: Secondary | ICD-10-CM

## 2023-05-12 LAB — DIGOXIN LEVEL: Digoxin, Serum: <0.4 ng/mL — ABNORMAL LOW (ref 0.5–0.9)

## 2023-05-12 MED ORDER — RIVAROXABAN 20 MG PO TABS: 20.0000 mg | ORAL_TABLET | Freq: Every day | ORAL | 3 refills | Status: DC

## 2023-05-12 MED ORDER — METOPROLOL SUCCINATE ER 25 MG PO TB24: 25.0000 mg | ORAL_TABLET | Freq: Two times a day (BID) | ORAL | 3 refills | Status: DC

## 2023-05-12 MED ORDER — OXYCODONE-ACETAMINOPHEN 5-325 MG PO TABS: 1.0000 | ORAL_TABLET | Freq: Four times a day (QID) | ORAL | 0 refills | Status: DC | PRN

## 2023-05-12 MED ORDER — DIGOXIN 125 MCG PO TABS: 0.1250 mg | ORAL_TABLET | Freq: Every day | ORAL | 3 refills | Status: DC

## 2023-05-12 NOTE — Patient Instructions (Signed)
Medication Instructions:  Please increase metoprolol succinate (TOPROL-XL) 25 MG 24 hr tablet to twice daily.   *If you need a refill on your cardiac medications before your next appointment, please call your pharmacy*   Lab Work: None ordered for today.  If you have labs (blood work) drawn today and your tests are completely normal, you will receive your results only by: MyChart Message (if you have MyChart) OR A paper copy in the mail If you have any lab test that is abnormal or we need to change your treatment, we will call you to review the results.   Testing/Procedures: None ordered today.    Follow-Up: At [facility], you and your health needs are our priority.  As part of our continuing mission to provide you with exceptional heart care, we have created designated Provider Care Teams.  These Care Teams include your primary Cardiologist (physician) and Advanced Practice Providers (APPs -  Physician Assistants and Nurse Practitioners) who all work together to provide you with the care you need, when you need it.  We recommend signing up for the patient portal called "MyChart".  Sign up information is provided on this After Visit Summary.  MyChart is used to connect with patients for Virtual Visits (Telemedicine).  Patients are able to view lab/test results, encounter notes, upcoming appointments, etc.  Non-urgent messages can be sent to your provider as well.   To learn more about what you can do with MyChart, go to [email or web address].    Your next appointment:   Appointment scheduled for June 27, 2023 at 11:20 am.

## 2023-05-12 NOTE — Progress Notes (Signed)
Cardiology Office Note:  .   Date:  05/12/2023  ID:  Stanley Kane, DOB [birth year], MRN [id number] PCP: [name], MD  [facility] Providers Cardiologist:  Julien Nordmann, MD Electrophysiologist:  Lanier Prude, MD    History of Present Illness: .   Stanley Kane is a 57 y.o. male with a past medical history of permanent atrial fibrillation diagnosed in 2013 status post DCCV in 06/2012 with recurrent A-fib status post repeat DCCV in 10/2014 on Xarelto, dilated cardiomyopathy, mild mitral regurgitation, prediabetes, GAD, remote cocaine use, and obesity, who presents today for follow-up.  Echo from 08/2014 showed EF 60 to 65%, mild MR, mild TR, normal RV SF and PASP.  He was noted to be back in A-fib in 2016 send medication noncompliance.  Following resumption of medications he underwent successful DCCV on 11/25/2014.  He was again noted to be back in A-fib in 12/2014 and has remained in A-fib and subsequent office visits leading to discontinuation of flecainide at in 09/2016.  He underwent calcium scoring in 09/2016 given atypical chest pain with a score of 0.  Nuclear stress testing completed in 08/2017 to evaluate for high risk ischemia that was negative for evidence of ischemia or scar.  EF was reduced felt to be underestimated given atrial fibrillation.  10/2017 EF was 45-50%, no RWMA, mildly dilated left atrium.  Seen 11/2019 needing annual follow-up for DOT clearance.  He was doing well from a cardiac perspective and was noted not to be taking Xarelto due to financial issues.  He underwent repeat Lexiscan MPI on 12/2019 which was considered low risk without evidence of ischemia.  Echo was recommended to confirm EF.  He was seen 11/2021 for DOT clearance.  Myoview Lexiscan was low risk, normal perfusion, normal LVEF, no significant coronary calcifications.  He was seen 03/15/2023 for preprocedure evaluation reported DOE and the echo was repeated.  04/13/2023 echo showed LVEF 60 to 65%, LV  with normal function and no RWMA.  There was mild mitral valve regurgitation.  His digoxin levels were also checked and at that time being subtherapeutic it was recommended he try coming off of the medication.  He was last seen in clinic 05/03/23 regarding preoperative clearance and a recent syncopal episode.  He stated he had gotten out of bed around 2 AM stood up quickly walked a few steps and started to fall.  He also endorsed getting dizzy with position changes.  [place] DMV guidelines 20 status post drive for minimum of 6 months and expressed understanding.  He was scheduled for labs, restarted on his prior dose of digoxin, and recommended to continue with Xarelto as he was found to be in A-fib with RVR at his appointment.  He returns to clinic today stating that he has been doing well.  He has chronic shortness of breath that is unchanged but denies any chest pain, chest tightness, palpitations, lightheadedness or dizziness.  Blood pressure has improved and heart rate has improved since being restarted on digoxin.  He states that he continues to have discomfort from the kidney stones and he needs clearance today to have lithotripsy done for stone removal.  He has been compliant with his Xarelto and has not noted any bleeding or blood noted in his urine or stool.  He has also been advised that he would need to hold his Xarelto 2 days prior to and restart after his procedure when deemed safe from urology.  He denies any hospitalizations or visits to  the emergency department.  ROS: 10 point review of systems has been reviewed and considered negative with exception of what is been listed in the HPI  Studies Reviewed: Marland Kitchen   EKG Interpretation Date/Time:  Friday May 12 2023 08:38:11 EDT Ventricular Rate:  101 PR Interval:    QRS Duration:  88 QT Interval:  342 QTC Calculation: 443 R Axis:   26  Text Interpretation: Atrial fibrillation with rapid ventricular response When compared with ECG of  03-May-2023 13:37, No significant change was found Confirmed by Charlsie Quest ([id number]) on 05/12/2023 8:43:08 AM   TTE 04/13/23  1. Left ventricular ejection fraction, by estimation, is 60 to 65%. The  left ventricle has normal function. The left ventricle has no regional  wall motion abnormalities. Left ventricular diastolic parameters are  indeterminate.   2. Right ventricular systolic function is normal. The right ventricular  size is normal. Tricuspid regurgitation signal is inadequate for assessing  PA pressure.   3. Left atrial size was moderately dilated.   4. The mitral valve is normal in structure. Mild mitral valve  regurgitation. No evidence of mitral stenosis.   5. The aortic valve is tricuspid. Aortic valve regurgitation is not  visualized. No aortic stenosis is present.   6. The inferior vena cava is normal in size with greater than 50%  respiratory variability, suggesting right atrial pressure of 3 mmHg.   Lexiscan MPI 11/29/21   The study is normal. The study is low risk.   No ST deviation was noted.   LV perfusion is normal. There is no evidence of ischemia. There is no evidence of infarction.   Left ventricular function is normal. End diastolic cavity size is normal. End systolic cavity size is normal.   CT attenuation images with no significant aortic or coronary calcifications. Risk Assessment/Calculations:    CHA2DS2-VASc Score = 1   This indicates a 0.6% annual risk of stroke. The patient's score is based upon: CHF History: 1 HTN History: 0 Diabetes History: 0 Stroke History: 0 Vascular Disease History: 0 Age Score: 0 Gender Score: 0             Physical Exam:   VS:  BP 115/70 (BP Location: Left Arm, Patient Position: Sitting, Cuff Size: Large)   Pulse (!) 101   Ht 5\' 8"  (1.727 m)   Wt 234 lb 9.6 oz (106.4 kg)   SpO2 98%   BMI 35.67 kg/m    Wt Readings from Last 3 Encounters:  05/12/23 234 lb 9.6 oz (106.4 kg)  05/03/23 231 lb 9.6 oz (105.1 kg)   03/15/23 226 lb (102.5 kg)    GEN: Well nourished, well developed in no acute distress NECK: No JVD; No carotid bruits CARDIAC: IR IR , no murmurs, rubs, gallops RESPIRATORY:  Clear to auscultation without rales, wheezing or rhonchi  ABDOMEN: Soft, non-tender, non-distended EXTREMITIES:  No edema; No deformity   ASSESSMENT AND PLAN: .   Permanent atrial fibrillation with EKG today revealing A-fib with a rate of 101.  Blood pressure is improved with little heart rate.  He is continued on rivaroxaban for his CHA2DS2-VASc score of 1 for stroke prophylaxis.  He does not meet reduced dosing criteria.  He has also not noted any blood in his urine or stool.  He has started on dig 0.25 mg daily and Toprol-XL was increased from 25 mg daily to twice daily.  HFimpEF with most recent echocardiogram revealed an LVEF of 60 to 65%, LV with normal function and  no RWMA.  He has mild mitral valve regurgitation.  He has NYHA class I-II symptoms.  He appears euvolemic and well compensated on exam.  GDMT is limited due to the need to increase beta-blocker therapy for better rate control.  He is encouraged to take his furosemide with weight change and shortness of breath as well as his peripheral edema as his furosemide is 20 mg as needed.  He is encouraged to continue to weigh daily and limit his sodium intake.  Hypotension that has resolved and no recurrent syncopal events.  Denies any dizziness or lightheadedness.  Blood work that was completed has all come back unrevealing dig level is still low but no titration of medication has been done as it is explained to the patient that we want to ensure that his level is not high versus low.  He has been reminded that with a syncopal event with unknown etiology DMV guidelines states that he is not supposed to drive for 6 months unless something called send of the event is found.  Perioperative cardiovascular exam for left extracorporeal shockwave lithotripsy with Dr.  Lonna Cobb.  According to ACC-AHA guidelines no further cardiovascular testing is needed the patient may proceed to surgery at acceptable risk.    Mr. Scarfone perioperative risk of a major cardiac event is 0.9% according to the Revised Cardiac Risk Index (RCRI).  Therefore, he is at low risk for perioperative complications.   His functional capacity is fair at 5.26 METs according to the Duke Activity Status Index (DASI). Recommendations: According to ACC/AHA guidelines, no further cardiovascular testing needed.  The patient may proceed to surgery at acceptable risk.           Dispo: Patient to return to clinic to see MD/APP had already scheduled appointment at the end of October  Signed, Jovane Foutz, NP

## 2023-05-12 NOTE — Telephone Encounter (Signed)
Patient advised. Patient will be waiting to hear back about surgery information

## 2023-05-12 NOTE — Telephone Encounter (Signed)
Rx sent.  Has been cleared by cardiology for procedure

## 2023-05-15 NOTE — Telephone Encounter (Signed)
Spoke with pt. Pt. Added to litho schedule for this Thursday 09/19 with Dr. Apolinar Junes. Coming to meet with me on Tuesday AM to sign consents.

## 2023-05-16 ENCOUNTER — Telehealth: Payer: Self-pay | Admitting: Urology

## 2023-05-16 ENCOUNTER — Telehealth: Payer: Self-pay

## 2023-05-16 NOTE — Telephone Encounter (Signed)
Please let him know we will not be able to refill his pain medication anymore and recommend he proceed with SWL as scheduled this Thursday

## 2023-05-16 NOTE — Telephone Encounter (Signed)
Spoke with Stanley Kane, rescheduled until 06/01/2023. Patient has been made aware no further narcotic prescription is to be given due to constant rescheduling of surgery.

## 2023-05-16 NOTE — Telephone Encounter (Signed)
Pt LM on triage line stating that he would like to cancel his upcoming surgery for this Thursday. Pt states that he will not be available to r/s procedure until 06/01/23.

## 2023-05-16 NOTE — Telephone Encounter (Signed)
I spoke with Mr. Laque regarding his rescheduling of ESWL this Thursday.  I explained to him that he has had the stone for several weeks now and it puts him at risk for continual kidney damage.  I also advised him that we would no longer be able to prescribe opioid pain medications for the pain as it has the potential to become addictive and he has been taking opioids for several weeks.  He states he could not come this Thursday for ESWL for personal reasons and he will not be able to have his as well until sometime in October.  I then transferred him to [facility] to get him scheduled.

## 2023-05-18 ENCOUNTER — Ambulatory Visit: Payer: Medicaid Other | Admitting: Cardiology

## 2023-05-22 DIAGNOSIS — Z79899 Other long term (current) drug therapy: Secondary | ICD-10-CM | POA: Diagnosis not present

## 2023-05-22 DIAGNOSIS — L400 Psoriasis vulgaris: Secondary | ICD-10-CM | POA: Diagnosis not present

## 2023-05-22 DIAGNOSIS — L918 Other hypertrophic disorders of the skin: Secondary | ICD-10-CM | POA: Diagnosis not present

## 2023-06-01 ENCOUNTER — Ambulatory Visit: Payer: Medicaid Other

## 2023-06-01 ENCOUNTER — Encounter
Admission: RE | Disposition: A | Discharge status: Home / Self Care | Payer: Self-pay | Source: Home / Self Care | Attending: Urology

## 2023-06-01 ENCOUNTER — Encounter: Payer: Self-pay | Admitting: Urology

## 2023-06-01 ENCOUNTER — Other Ambulatory Visit: Payer: Self-pay | Admitting: Urology

## 2023-06-01 ENCOUNTER — Ambulatory Visit
Admission: RE | Admit: 2023-06-01 | Discharge: 2023-06-01 | Disposition: A | Discharge status: Home / Self Care | Payer: Medicaid Other | Attending: Urology | Admitting: Urology

## 2023-06-01 ENCOUNTER — Other Ambulatory Visit: Payer: Self-pay

## 2023-06-01 DIAGNOSIS — I4819 Other persistent atrial fibrillation: Secondary | ICD-10-CM | POA: Diagnosis not present

## 2023-06-01 DIAGNOSIS — N201 Calculus of ureter: Secondary | ICD-10-CM

## 2023-06-01 DIAGNOSIS — N200 Calculus of kidney: Secondary | ICD-10-CM | POA: Diagnosis not present

## 2023-06-01 DIAGNOSIS — Z7901 Long term (current) use of anticoagulants: Secondary | ICD-10-CM | POA: Diagnosis not present

## 2023-06-01 DIAGNOSIS — Z87891 Personal history of nicotine dependence: Secondary | ICD-10-CM | POA: Insufficient documentation

## 2023-06-01 HISTORY — PX: EXTRACORPOREAL SHOCK WAVE LITHOTRIPSY: SHX1557

## 2023-06-01 SURGERY — LITHOTRIPSY, ESWL
Anesthesia: Moderate Sedation | Laterality: Left

## 2023-06-01 MED ORDER — BUPIVACAINE LIPOSOME 1.3 % IJ SUSP
INTRAMUSCULAR | Status: AC
  Filled 2023-06-01: qty 20

## 2023-06-01 MED ORDER — MIDAZOLAM HCL 2 MG/2ML IJ SOLN
INTRAMUSCULAR | Status: AC
  Filled 2023-06-01: qty 2

## 2023-06-01 MED ORDER — DIPHENHYDRAMINE HCL 25 MG PO CAPS
ORAL_CAPSULE | ORAL | Status: AC
  Filled 2023-06-01: qty 1

## 2023-06-01 MED ORDER — OXYCODONE-ACETAMINOPHEN 5-325 MG PO TABS
1.0000 | ORAL_TABLET | Freq: Four times a day (QID) | ORAL | 0 refills | Status: AC | PRN
Start: 2023-06-01 — End: 2023-06-04

## 2023-06-01 MED ORDER — TAMSULOSIN HCL 0.4 MG PO CAPS: 0.4000 mg | ORAL_CAPSULE | Freq: Every day | ORAL | 0 refills | Status: DC

## 2023-06-01 MED ORDER — SODIUM CHLORIDE 0.9 % IV SOLN: INTRAVENOUS | Status: DC

## 2023-06-01 MED ORDER — CEPHALEXIN 500 MG PO CAPS
500.0000 mg | ORAL_CAPSULE | Freq: Once | ORAL | Status: AC
  Administered 2023-06-01: 500 mg via ORAL

## 2023-06-01 MED ORDER — ONDANSETRON HCL 4 MG/2ML IJ SOLN
4.0000 mg | Freq: Once | INTRAMUSCULAR | Status: AC
  Administered 2023-06-01: 4 mg via INTRAVENOUS

## 2023-06-01 MED ORDER — DIPHENHYDRAMINE HCL 25 MG PO CAPS
25.0000 mg | ORAL_CAPSULE | Freq: Once | ORAL | Status: AC
  Administered 2023-06-01: 25 mg via ORAL

## 2023-06-01 MED ORDER — ONDANSETRON HCL 4 MG/2ML IJ SOLN
INTRAMUSCULAR | Status: AC
  Filled 2023-06-01: qty 2

## 2023-06-01 MED ORDER — CEPHALEXIN 500 MG PO CAPS
ORAL_CAPSULE | ORAL | Status: AC
  Filled 2023-06-01: qty 1

## 2023-06-01 MED ORDER — DIAZEPAM 5 MG PO TABS
10.0000 mg | ORAL_TABLET | Freq: Once | ORAL | Status: AC
  Administered 2023-06-01: 10 mg via ORAL

## 2023-06-01 MED ORDER — BUPIVACAINE HCL (PF) 0.5 % IJ SOLN
INTRAMUSCULAR | Status: AC
  Filled 2023-06-01: qty 20

## 2023-06-01 MED ORDER — DIAZEPAM 5 MG PO TABS
ORAL_TABLET | ORAL | Status: AC
  Filled 2023-06-01: qty 2

## 2023-06-01 NOTE — Brief Op Note (Signed)
06/01/2023  10:11 AM  PATIENT:  Stanley Kane  57 y.o. male  PRE-OPERATIVE DIAGNOSIS:  Left 4mm proximal Ureteral Stone  POST-OPERATIVE DIAGNOSIS:  Same  PROCEDURE:  Procedure(s): EXTRACORPOREAL SHOCK WAVE LITHOTRIPSY (ESWL) (Left)  SURGEON:  Surgeons and Role:    Sondra Come, MD - Primary  ANESTHESIA: Conscious Sedation  EBL:  None  Drains: None  Specimen: None  Findings:  Stone appeared to smudge, challenging SWL-> needed to recouple, slow rate and power to tolerate  DISPO: Flomax, pain meds PRN, RTC 2 weeks KUB  Legrand Rams, MD 06/01/2023

## 2023-06-01 NOTE — Discharge Instructions (Signed)

## 2023-06-01 NOTE — H&P (Signed)
   06/01/23 8:48 AM   Stanley Kane 03/10/1966 [id number]  CC: left proximal ureteral stone  HPI: 57 yo M with 4mm left proximal ureteral stone diagnosed 02/20/2023, failed trial of medical expulsive therapy and opted for L SWL.   PMH: Past Medical History:  Diagnosis Date   Anxiety    Chest pain    a. 09/2016 Cor Ca2+ scoring: 0; b. 08/2017 MV: No isch/infarct. EF 33-44%; c. 12/2019 MV: No isch/infarct. EF 46%; d. 11/2021 MV: No isch/infarct. Nl LVEF. No significant Ao/Cor Ca2+.   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 09/2014 Echo: EF 60-65%, mild MR/TR; b. 10/2017 Echo: EF 45-50%, no rwma, mildly dil LA, nl RV fxn.   Obesity    OSA (obstructive sleep apnea)    Persistent atrial fibrillation (HCC)    a. dx 2013 s/p DCCV; b. recurrent Afib 2016 s/p DCCV 11/25/2014; c. 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   hospitalization [facility] - stress with father's illness    Surgical History: Past Surgical History:  Procedure Laterality Date   CARDIOVASCULAR STRESS TEST  ~2005   thinks at [facility], told normal   carotid US  08/2014   no plaque   COLONOSCOPY  2000   WNL, for weight loss after started gym   MRI  08/2014   brain - WNL, skull base C1/2 articulation   TONSILLECTOMY AND ADENOIDECTOMY  childhood   US ECHOCARDIOGRAPHY  08/2014   EF 60%, nl vent fxn, mildly dilated LA, mild MR, mild TR    Family History: Family History  Problem Relation Age of Onset   Hypertension Father    Coronary artery disease Father 30   Heart attack Father    Cirrhosis Father    Hypercalcemia Father    Diabetes Mother    Heart disease Mother    CAD Sister 73       deceased from MI   GI Disease Sister    Stroke Neg Hx     Social History:  reports that he quit smoking about 26 years ago. His  smoking use included cigarettes. He has never used smokeless tobacco. He reports that he does not currently use alcohol after a past usage of about 2.0 standard drinks of alcohol per week. He reports current drug use. Drug: Cocaine.  Physical Exam: BP (!) 113/95   Pulse (!) 54   Temp (!) 97.4 F (36.3 C) (Oral)   Resp 18   Ht 5\' 8"  (1.727 m)   Wt 106.4 kg   SpO2 96%   BMI 35.67 kg/m    Constitutional:  Alert and oriented, No acute distress. Cardiovascular: RRR Respiratory: CTA bilaterally GI: Abdomen is soft, nontender, nondistended, no abdominal masses  Assessment & Plan:   57 yo M with 4mm left proximal ureteral stone since June 2024, failed trial of medical expulsive therapy.  We specifically discussed the risks shockwave including bleeding/hematoma, infection/sepsis, flank pain/urgency/frequency/dysuria, or need for staged or additional procedures.  Left shockwave lithotripsy  Legrand Rams, MD 06/01/2023  [facility] [address], [address] [place], [place] [address] [phone]

## 2023-06-02 ENCOUNTER — Other Ambulatory Visit: Payer: Self-pay

## 2023-06-02 ENCOUNTER — Other Ambulatory Visit: Payer: Self-pay | Admitting: Family Medicine

## 2023-06-02 ENCOUNTER — Encounter: Payer: Self-pay | Admitting: Urology

## 2023-06-02 DIAGNOSIS — N201 Calculus of ureter: Secondary | ICD-10-CM

## 2023-06-02 DIAGNOSIS — Z1212 Encounter for screening for malignant neoplasm of rectum: Secondary | ICD-10-CM

## 2023-06-02 DIAGNOSIS — Z1211 Encounter for screening for malignant neoplasm of colon: Secondary | ICD-10-CM

## 2023-06-05 ENCOUNTER — Telehealth: Payer: Self-pay | Admitting: *Deleted

## 2023-06-05 ENCOUNTER — Telehealth: Payer: Self-pay | Admitting: Cardiovascular Disease

## 2023-06-05 NOTE — Telephone Encounter (Signed)
Pt c/o medication issue:  1. Name of Medication:  [facility] - [place], [place] - [address]    2. How are you currently taking this medication (dosage and times per day)?    3. Are you having a reaction (difficulty breathing--STAT)? no  4. What is your medication issue? Patient calling with question with starting medication back. Please advise

## 2023-06-05 NOTE — Telephone Encounter (Signed)
There was a after hours paper that got printed out. The patient was having very dark urine . The urine looked like cola-cola. I advised patient that it was old blood. Please drink water . Let us know if you are having any other problems. He understand and will call with any other questions.

## 2023-06-08 DIAGNOSIS — Z1211 Encounter for screening for malignant neoplasm of colon: Secondary | ICD-10-CM | POA: Diagnosis not present

## 2023-06-08 DIAGNOSIS — Z1212 Encounter for screening for malignant neoplasm of rectum: Secondary | ICD-10-CM | POA: Diagnosis not present

## 2023-06-08 NOTE — Progress Notes (Signed)
Date:  06/09/2023   ID:  Stanley Kane, DOB 1966-02-18, MRN [id number]  Patient Location:  [address] [place] [address] [place] [address]   Provider location:   [facility], [place] office  PCP:  [name], MD  Cardiologist:  [facility]  Chief Complaint  Patient presents with   3 month follow up     Patient c/o shortness of breath at times. Medications reviewed by the patient verbally.     History of Present Illness:    Stanley Kane is a 57 y.o. male past medical history of permanent Afib diagnosed in 2013  s/p DCCV 07/06/2012 with recurrent Afib s/p repeat DCCV on 11/25/2014 on Xarelto,  obesity with possible OSA,  anxiety,  prior medication noncompliance. Who presents for follow-up of his permanent atrial fibrillation and shortness of breath  Last seen by myself on telemetry visit 2020 Seen by one of our providers May 12, 2023  Low risk stress test May 2021 Echocardiogram August 2024 EF 60% Recent events reviewed,  Remote episode of syncope  Out of bed 2 AM stood up quickly, developed symptoms  atrial fibrillation with RVR February 2024  Chronic shortness of breath Placed back on digoxin  Metoprolol succinate up to 25 twice daily Heart rate improved No regular exercise program Not taking Lasix, no lower extremity edema  Off Xarelto for kidney stones requiring lithotripsy Hematuria improved Restarted xarelto yesterday  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  successful DCCV on 11/25/2014.   EKG personally reviewed by myself on todays visit EKG Interpretation Date/Time:  Friday June 09 2023 10:01:29 EDT Ventricular Rate:  69 PR Interval:    QRS Duration:  92 QT Interval:  376 QTC Calculation: 402 R Axis:   36  Text Interpretation: Atrial fibrillation When compared with ECG of 12-May-2023 08:38, No  significant change was found Confirmed by Julien Nordmann [phone]) on 06/09/2023 10:14:52 AM    Prior CV studies:   The following studies were reviewed today:  Prior echo from 08/2014 showed an EF of 60-65%, mild MR, mild TR, normal RVSF and PASP.   Prior stress test from 2005 was reportedly normal.   calcium scoring in 09/2016 given atypical chest pain with a score of 0   nuclear stress testing that was negative for evidence of ischemia or scar.  EF was reduced, felt to be underestimated given Afib. We followed this up with an echo in 10/2017 that showed an EF of 45-50%, no RWMA, mildly dilated left atrium measuring 42 mm, RVSF and PASP were normal.     Past Medical History:  Diagnosis Date   Anxiety    Chest pain    a. 09/2016 Cor Ca2+ scoring: 0; b. 08/2017 MV: No isch/infarct. EF 33-44%; c. 12/2019 MV: No isch/infarct. EF 46%; d. 11/2021 MV: No isch/infarct. Nl LVEF. No significant Ao/Cor Ca2+.   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 09/2014 Echo: EF 60-65%, mild MR/TR; b. 10/2017 Echo: EF 45-50%, no rwma, mildly dil LA, nl RV fxn.   Obesity    OSA (obstructive sleep apnea)    Persistent atrial fibrillation (HCC)    a. dx  2013 s/p DCCV; b. recurrent Afib 2016 s/p DCCV 11/25/2014; c. 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   hospitalization [facility] - stress with father's illness   Past Surgical History:  Procedure Laterality Date   CARDIOVASCULAR STRESS TEST  ~2005   thinks at [facility], told normal   carotid US  08/2014   no plaque   COLONOSCOPY  2000   WNL, for weight loss after started gym   EXTRACORPOREAL SHOCK WAVE LITHOTRIPSY Left 06/01/2023   Procedure: EXTRACORPOREAL SHOCK WAVE LITHOTRIPSY (ESWL);  Surgeon: Sondra Come, MD;  Location: [facility] ORS;  Service: Urology;  Laterality:  Left;   MRI  08/2014   brain - WNL, skull base C1/2 articulation   TONSILLECTOMY AND ADENOIDECTOMY  childhood   US ECHOCARDIOGRAPHY  08/2014   EF 60%, nl vent fxn, mildly dilated LA, mild MR, mild TR     Current Meds  Medication Sig   calcipotriene (DOVONOX) 0.005 % cream Apply topically 2 (two) times daily.   clobetasol cream (TEMOVATE) 0.05 % Apply topically 2 (two) times daily.   DENTA 5000 PLUS 1.1 % CREA dental cream Take 1 Application by mouth daily.   escitalopram (LEXAPRO) 10 MG tablet Take 1 tablet (10 mg total) by mouth daily.   metoprolol succinate (TOPROL-XL) 25 MG 24 hr tablet Take 1 tablet (25 mg total) by mouth in the morning and at bedtime.   Vitamin D, Ergocalciferol, 50000 units CAPS Take 1 capsule by mouth once a week.   [DISCONTINUED] digoxin (LANOXIN) 0.125 MG tablet Take 1 tablet (0.125 mg total) by mouth daily.   [DISCONTINUED] furosemide (LASIX) 20 MG tablet Take 1 tablet (20 mg total) by mouth as needed for edema.   [DISCONTINUED] rivaroxaban (XARELTO) 20 MG TABS tablet Take 1 tablet (20 mg total) by mouth daily with supper.     Allergies:   Vicodin [hydrocodone-acetaminophen]   Social History   Tobacco Use   Smoking status: Former    Current packs/day: 0.00    Types: Cigarettes    Quit date: 08/29/1996    Years since quitting: 26.7   Smokeless tobacco: Never  Vaping Use   Vaping status: Never Used  Substance Use Topics   Alcohol use: Not Currently    Alcohol/week: 2.0 standard drinks of alcohol    Types: 2 Cans of beer per week    Comment: quit beginning of 2020   Drug use: Yes    Types: Cocaine     Family Hx: The patient's family history includes CAD (age of onset: 39) in his sister; Cirrhosis in his father; Coronary artery disease (age of onset: 100) in his father; Diabetes in his mother; GI Disease in his sister; Heart attack in his father; Heart disease in his mother; Hypercalcemia in his father; Hypertension in his father. There is no history of  Stroke.  ROS:   Please see the history of present illness.    Review of Systems  Constitutional: Negative.   Respiratory: Negative.    Cardiovascular: Negative.   Gastrointestinal: Negative.   Musculoskeletal: Negative.   Neurological: Negative.   Psychiatric/Behavioral: Negative.    All other systems reviewed and are negative.    Labs/Other Tests and Data Reviewed:    Recent Labs: 10/12/2022: B Natriuretic Peptide 162.2 03/08/2023: ALT 30; TSH 2.28 05/03/2023: BUN 13; Creatinine, Ser 0.85; Hemoglobin 14.6; Magnesium 2.0; Platelets 364; Potassium 5.0; Sodium 139   Recent Lipid Panel Lab Results  Component Value Date/Time   CHOL 169  03/08/2023 09:06 AM   CHOL 157 09/25/2014 05:31 PM   CHOL 175 10/10/2011 12:00 AM   TRIG 425.0 (H) 03/08/2023 09:06 AM   TRIG 250 (H) 09/25/2014 05:31 PM   TRIG 93 10/10/2011 12:00 AM   HDL 40.80 03/08/2023 09:06 AM   HDL 33 (L) 09/25/2014 05:31 PM   CHOLHDL 4 03/08/2023 09:06 AM   LDLCALC 103 (H) 09/08/2015 10:36 AM   LDLCALC 74 09/25/2014 05:31 PM   LDLDIRECT 89.0 03/08/2023 09:06 AM    Wt Readings from Last 3 Encounters:  06/09/23 240 lb 8 oz (109.1 kg)  06/01/23 234 lb 9.6 oz (106.4 kg)  05/12/23 234 lb 9.6 oz (106.4 kg)     Exam:    Vital Signs: Vital signs may also be detailed in the HPI BP 110/80 (BP Location: Left Arm, Patient Position: Sitting, Cuff Size: Normal)   Pulse 69   Ht 5\' 8"  (1.727 m)   Wt 240 lb 8 oz (109.1 kg)   SpO2 98%   BMI 36.57 kg/m   Constitutional:  oriented to person, place, and time. No distress.  HENT:  Head: Grossly normal Eyes:  no discharge. No scleral icterus.  Neck: No JVD, no carotid bruits  Cardiovascular: Irregularly irregular, no murmurs appreciated Pulmonary/Chest: Clear to auscultation bilaterally, no wheezes or rails Abdominal: Soft.  no distension.  no tenderness.  Musculoskeletal: Normal range of motion Neurological:  normal muscle tone. Coordination normal. No atrophy Skin: Skin warm  and dry Psychiatric: normal affect, pleasant   ASSESSMENT & PLAN:    Permanent atrial fibrillation Rate well controlled on digoxin, metoprolol succinate 25 twice daily Tolerating anticoagulation, back on xarelto after lithotripsy He is watching for recurrent hematuria following kidney stone  Dilated cardiomyopathy (HCC) Normal ejection fraction on echocardiogram August 2024 Lasix as needed Appears euvolemic on exam  Obesity We have encouraged continued exercise, careful diet management in an effort to lose weight.      Signed, Julien Nordmann, MD  06/09/2023 10:28 AM    [facility] [address], [place], [place] [address]

## 2023-06-09 ENCOUNTER — Encounter: Payer: Self-pay | Admitting: Cardiovascular Disease

## 2023-06-09 ENCOUNTER — Ambulatory Visit: Payer: Medicaid Other | Attending: Medical | Admitting: Cardiovascular Disease

## 2023-06-09 VITALS — BP 110/80 | HR 69 | Ht 68.0 in | Wt 240.5 lb

## 2023-06-09 DIAGNOSIS — I420 Dilated cardiomyopathy: Secondary | ICD-10-CM | POA: Diagnosis not present

## 2023-06-09 DIAGNOSIS — I959 Hypotension, unspecified: Secondary | ICD-10-CM | POA: Diagnosis not present

## 2023-06-09 DIAGNOSIS — R55 Syncope and collapse: Secondary | ICD-10-CM | POA: Diagnosis not present

## 2023-06-09 DIAGNOSIS — I5032 Chronic diastolic (congestive) heart failure: Secondary | ICD-10-CM | POA: Diagnosis not present

## 2023-06-09 DIAGNOSIS — I5020 Unspecified systolic (congestive) heart failure: Secondary | ICD-10-CM

## 2023-06-09 DIAGNOSIS — I4891 Unspecified atrial fibrillation: Secondary | ICD-10-CM

## 2023-06-09 DIAGNOSIS — R0609 Other forms of dyspnea: Secondary | ICD-10-CM

## 2023-06-09 MED ORDER — RIVAROXABAN 20 MG PO TABS: 20.0000 mg | ORAL_TABLET | Freq: Every day | ORAL | 3 refills | Status: DC

## 2023-06-09 MED ORDER — DIGOXIN 125 MCG PO TABS: 0.1250 mg | ORAL_TABLET | Freq: Every day | ORAL | 3 refills | Status: DC

## 2023-06-09 MED ORDER — FUROSEMIDE 20 MG PO TABS: 20.0000 mg | ORAL_TABLET | ORAL | 3 refills | Status: DC | PRN

## 2023-06-09 NOTE — Patient Instructions (Signed)

## 2023-06-12 NOTE — Telephone Encounter (Signed)
Patient was seen in clinic on 06/09/23 all questions answered at that time.

## 2023-06-13 LAB — COLOGUARD: COLOGUARD: NEGATIVE

## 2023-06-16 ENCOUNTER — Ambulatory Visit: Payer: Medicaid Other | Admitting: Family Medicine

## 2023-06-19 NOTE — Progress Notes (Unsigned)
06/21/2023 4:55 PM   Stanley Kane 03-26-1966 [id number]  Referring provider: [name], MD [address] [place],  [place] [address]  Urological history: 1.  Nephrolithiasis -left ESWL (05/2023)   No chief complaint on file.  HPI: Stanley Kane is a 57 y.o. male who presents today for follow up.    Previous records reviewed.    His stone was originally discovered back in June, but his treatment was delayed due to the need for cardiac clearance.  He underwent ESWL on June 01, 2023.  Postprocedural course was as expected and uneventful.  UA ***  KUB ***  PMH: Past Medical History:  Diagnosis Date   Anxiety    Chest pain    a. 09/2016 Cor Ca2+ scoring: 0; b. 08/2017 MV: No isch/infarct. EF 33-44%; c. 12/2019 MV: No isch/infarct. EF 46%; d. 11/2021 MV: No isch/infarct. Nl LVEF. No significant Ao/Cor Ca2+.   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 09/2014 Echo: EF 60-65%, mild MR/TR; b. 10/2017 Echo: EF 45-50%, no rwma, mildly dil LA, nl RV fxn.   Obesity    OSA (obstructive sleep apnea)    Persistent atrial fibrillation (HCC)    a. dx 2013 s/p DCCV; b. recurrent Afib 2016 s/p DCCV 11/25/2014; c. 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   hospitalization [facility] - stress with father's illness    Surgical History: Past Surgical History:  Procedure Laterality Date   CARDIOVASCULAR STRESS TEST  ~2005   thinks at [facility], told normal   carotid US  08/2014   no plaque   COLONOSCOPY  2000   WNL, for weight loss after started gym   EXTRACORPOREAL SHOCK WAVE LITHOTRIPSY Left 06/01/2023   Procedure: EXTRACORPOREAL SHOCK WAVE LITHOTRIPSY (ESWL);  Surgeon: Sondra Come, MD;  Location: [facility] ORS;  Service: Urology;  Laterality: Left;   MRI  08/2014   brain - WNL,  skull base C1/2 articulation   TONSILLECTOMY AND ADENOIDECTOMY  childhood   US ECHOCARDIOGRAPHY  08/2014   EF 60%, nl vent fxn, mildly dilated LA, mild MR, mild TR    Home Medications:  Allergies as of 06/21/2023       Reactions   Vicodin [hydrocodone-acetaminophen]    Makes him "jittery"        Medication List        Accurate as of June 19, 2023  4:55 PM. If you have any questions, ask your nurse or doctor.          calcipotriene 0.005 % cream Commonly known as: DOVONOX Apply topically 2 (two) times daily.   clobetasol cream 0.05 % Commonly known as: TEMOVATE Apply topically 2 (two) times daily.   Denta 5000 Plus 1.1 % Crea dental cream Generic drug: sodium fluoride Take 1 Application by mouth daily.   digoxin 0.125 MG tablet Commonly known as: LANOXIN Take 1 tablet (0.125 mg total) by mouth daily.   escitalopram 10 MG tablet Commonly known as: Lexapro Take 1 tablet (10 mg total) by mouth daily.   furosemide 20 MG tablet Commonly known as: LASIX Take 1 tablet (20 mg total) by mouth as needed for edema.   metoprolol succinate 25 MG 24 hr tablet Commonly known as: TOPROL-XL Take 1 tablet (  25 mg total) by mouth in the morning and at bedtime.   oxyCODONE-acetaminophen 5-325 MG tablet Commonly known as: PERCOCET/ROXICET Take 1 tablet by mouth every 6 (six) hours as needed for severe pain.   rivaroxaban 20 MG Tabs tablet Commonly known as: XARELTO Take 1 tablet (20 mg total) by mouth daily with supper.   tamsulosin 0.4 MG Caps capsule Commonly known as: FLOMAX Take 1 capsule (0.4 mg total) by mouth daily after supper.   triamcinolone cream 0.1 % Commonly known as: KENALOG APPLY TO AFFECTED AREA TWICE A DAY AS DIRECTED   Vitamin D (Ergocalciferol) 50000 units Caps Take 1 capsule by mouth once a week.        Allergies:  Allergies  Allergen Reactions   Vicodin [Hydrocodone-Acetaminophen]     Makes him "jittery"    Family History: Family  History  Problem Relation Age of Onset   Hypertension Father    Coronary artery disease Father 73   Heart attack Father    Cirrhosis Father    Hypercalcemia Father    Diabetes Mother    Heart disease Mother    CAD Sister 15       deceased from MI   GI Disease Sister    Stroke Neg Hx     Social History:  reports that he quit smoking about 26 years ago. His smoking use included cigarettes. He has never used smokeless tobacco. He reports that he does not currently use alcohol after a past usage of about 2.0 standard drinks of alcohol per week. He reports current drug use. Drug: Cocaine.  ROS: Pertinent ROS in HPI  Physical Exam: There were no vitals taken for this visit.  Constitutional:  Well nourished. Alert and oriented, No acute distress. HEENT: [place] AT, moist mucus membranes.  Trachea midline, no masses. Cardiovascular: No clubbing, cyanosis, or edema. Respiratory: Normal respiratory effort, no increased work of breathing. GI: Abdomen is soft, non tender, non distended, no abdominal masses. Liver and spleen not palpable.  No hernias appreciated.  Stool sample for occult testing is not indicated.   GU: No CVA tenderness.  No bladder fullness or masses.  Patient with circumcised/uncircumcised phallus. ***Foreskin easily retracted***  Urethral meatus is patent.  No penile discharge. No penile lesions or rashes. Scrotum without lesions, cysts, rashes and/or edema.  Testicles are located scrotally bilaterally. No masses are appreciated in the testicles. Left and right epididymis are normal. Rectal: Patient with  normal sphincter tone. Anus and perineum without scarring or rashes. No rectal masses are appreciated. Prostate is approximately *** grams, *** nodules are appreciated. Seminal vesicles are normal. Skin: No rashes, bruises or suspicious lesions. Lymph: No cervical or inguinal adenopathy. Neurologic: Grossly intact, no focal deficits, moving all 4 extremities. Psychiatric: Normal  mood and affect.  Laboratory Data: Lab Results  Component Value Date   WBC 9.5 05/03/2023   HGB 14.6 05/03/2023   HCT 43.9 05/03/2023   MCV 97 05/03/2023   PLT 364 05/03/2023    Lab Results  Component Value Date   CREATININE 0.85 05/03/2023    Lab Results  Component Value Date   PSA 0.90 03/08/2023   PSA 0.58 12/27/2021   PSA 0.6 10/10/2011    Lab Results  Component Value Date   HGBA1C 5.9 03/08/2023    Lab Results  Component Value Date   TSH 2.28 03/08/2023       Component Value Date/Time   CHOL 169 03/08/2023 0906   CHOL 157 09/25/2014 1731   CHOL  175 10/10/2011 0000   HDL 40.80 03/08/2023 0906   HDL 33 (L) 09/25/2014 1731   CHOLHDL 4 03/08/2023 0906   VLDL 41.2 (H) 12/27/2021 1218   VLDL 50 (H) 09/25/2014 1731   LDLCALC 103 (H) 09/08/2015 1036   LDLCALC 74 09/25/2014 1731    Lab Results  Component Value Date   AST 28 03/08/2023   Lab Results  Component Value Date   ALT 30 03/08/2023   Urinalysis See EPIC and HPI I have reviewed the labs.   Pertinent Imaging: KUB ***, radiologist interpretation still pending  I have independently reviewed the films.    Assessment & Plan:  ***  1. Left ureteral stone -UA *** -KUB ***  No follow-ups on file.  These notes generated with voice recognition software. I apologize for typographical errors.  Cloretta Ned  [facility] [address]  [address] [place], [place] [address] [phone]

## 2023-06-21 ENCOUNTER — Ambulatory Visit (INDEPENDENT_AMBULATORY_CARE_PROVIDER_SITE_OTHER): Payer: Medicaid Other | Admitting: Urology

## 2023-06-21 ENCOUNTER — Ambulatory Visit
Admission: RE | Admit: 2023-06-21 | Discharge: 2023-06-21 | Disposition: A | Discharge status: Home / Self Care | Payer: Medicaid Other | Source: Ambulatory Visit | Attending: Urology | Admitting: Urology

## 2023-06-21 ENCOUNTER — Ambulatory Visit
Admission: RE | Admit: 2023-06-21 | Discharge: 2023-06-21 | Disposition: A | Discharge status: Home / Self Care | Payer: Medicaid Other | Attending: Urology | Admitting: Urology

## 2023-06-21 ENCOUNTER — Encounter: Payer: Self-pay | Admitting: Urology

## 2023-06-21 VITALS — BP 110/73 | HR 114

## 2023-06-21 DIAGNOSIS — Z09 Encounter for follow-up examination after completed treatment for conditions other than malignant neoplasm: Secondary | ICD-10-CM

## 2023-06-21 DIAGNOSIS — N201 Calculus of ureter: Secondary | ICD-10-CM | POA: Diagnosis not present

## 2023-06-21 DIAGNOSIS — Z87898 Personal history of other specified conditions: Secondary | ICD-10-CM | POA: Diagnosis not present

## 2023-06-21 DIAGNOSIS — Z87442 Personal history of urinary calculi: Secondary | ICD-10-CM

## 2023-06-21 DIAGNOSIS — R3129 Other microscopic hematuria: Secondary | ICD-10-CM

## 2023-06-21 LAB — URINALYSIS, COMPLETE
Bilirubin, UA: NEGATIVE
Glucose, UA: NEGATIVE
Ketones, UA: NEGATIVE
Leukocytes,UA: NEGATIVE
Nitrite, UA: NEGATIVE
Protein,UA: NEGATIVE
Specific Gravity, UA: 1.02 (ref 1.005–1.030)
Urobilinogen, Ur: 0.2 mg/dL (ref 0.2–1.0)
pH, UA: 6 (ref 5.0–7.5)

## 2023-06-21 LAB — MICROSCOPIC EXAMINATION

## 2023-06-22 ENCOUNTER — Other Ambulatory Visit: Payer: Medicaid Other

## 2023-06-22 DIAGNOSIS — N201 Calculus of ureter: Secondary | ICD-10-CM

## 2023-06-23 DIAGNOSIS — L918 Other hypertrophic disorders of the skin: Secondary | ICD-10-CM | POA: Diagnosis not present

## 2023-06-23 DIAGNOSIS — L2089 Other atopic dermatitis: Secondary | ICD-10-CM | POA: Diagnosis not present

## 2023-06-23 DIAGNOSIS — L400 Psoriasis vulgaris: Secondary | ICD-10-CM | POA: Diagnosis not present

## 2023-06-26 ENCOUNTER — Encounter: Payer: Self-pay | Admitting: Family Medicine

## 2023-06-26 ENCOUNTER — Ambulatory Visit: Payer: Medicaid Other | Admitting: Family Medicine

## 2023-06-26 VITALS — BP 118/64 | HR 97 | Temp 97.9°F | Ht 68.0 in | Wt 238.0 lb

## 2023-06-26 DIAGNOSIS — Z23 Encounter for immunization: Secondary | ICD-10-CM | POA: Diagnosis not present

## 2023-06-26 DIAGNOSIS — N200 Calculus of kidney: Secondary | ICD-10-CM | POA: Diagnosis not present

## 2023-06-26 DIAGNOSIS — I420 Dilated cardiomyopathy: Secondary | ICD-10-CM

## 2023-06-26 DIAGNOSIS — F411 Generalized anxiety disorder: Secondary | ICD-10-CM

## 2023-06-26 DIAGNOSIS — L400 Psoriasis vulgaris: Secondary | ICD-10-CM | POA: Diagnosis not present

## 2023-06-26 DIAGNOSIS — D171 Benign lipomatous neoplasm of skin and subcutaneous tissue of trunk: Secondary | ICD-10-CM | POA: Diagnosis not present

## 2023-06-26 DIAGNOSIS — E559 Vitamin D deficiency, unspecified: Secondary | ICD-10-CM | POA: Diagnosis not present

## 2023-06-26 DIAGNOSIS — I4821 Permanent atrial fibrillation: Secondary | ICD-10-CM

## 2023-06-26 DIAGNOSIS — Z5986 Financial insecurity: Secondary | ICD-10-CM

## 2023-06-26 DIAGNOSIS — G4733 Obstructive sleep apnea (adult) (pediatric): Secondary | ICD-10-CM | POA: Diagnosis not present

## 2023-06-26 DIAGNOSIS — E785 Hyperlipidemia, unspecified: Secondary | ICD-10-CM | POA: Diagnosis not present

## 2023-06-26 MED ORDER — ESCITALOPRAM OXALATE 20 MG PO TABS: 20.0000 mg | ORAL_TABLET | Freq: Every day | ORAL | 6 refills | Status: DC

## 2023-06-26 NOTE — Assessment & Plan Note (Signed)
Recent trig markedly elevated (not truly fasting). Will return fasting for rpt FLP.  The 10-year ASCVD risk score (Arnett DK, et al., 2019) is: 5.3%   Values used to calculate the score:     Age: 57 years     Sex: Male     Is Non-Hispanic African American: No     Diabetic: No     Tobacco smoker: No     Systolic Blood Pressure: 118 mmHg     Is BP treated: No     HDL Cholesterol: 40.8 mg/dL     Total Cholesterol: 169 mg/dL

## 2023-06-26 NOTE — Assessment & Plan Note (Addendum)
Chronic anxiety > depression.  Doing ok on Lexapro 10mg  daily but notes ongoing difficulty with situational family stress.  Agrees to try higher Lexapro 20mg  dose - sent to pharmacy.

## 2023-06-26 NOTE — Assessment & Plan Note (Signed)
H/o this, declines CPAP therapy or further evaluation at this time.

## 2023-06-26 NOTE — Patient Instructions (Addendum)
Flu shot today  Schedule fasting labs at your convenience (4 hour fast, ok to drink water, take medicines, [place] coffee). Increase dietary calcium (dairy products like milk, cheese, yogurt and leafy green vegetables).  Increase lexapro to 20mg  daily We will refer you to general surgeon for evaluation of lipoma removal  Return in 4 months for follow up visit

## 2023-06-26 NOTE — Progress Notes (Signed)
Ph: [phone] Fax: [phone]   Patient ID: Stanley Kane, male    DOB: 06/12/1966, 57 y.o.   MRN: [id number]  This visit was conducted in person.  BP 118/64   Pulse 97   Temp 97.9 F (36.6 C) (Oral)   Ht 5\' 8"  (1.727 m)   Wt 238 lb (108 kg)   SpO2 98%   BMI 36.19 kg/m    CC: 3 mo f/u visit  Subjective:   HPI: Stanley Kane is a 57 y.o. male presenting on 06/26/2023 for Medical Management of Chronic Issues (Here for 3 mo f/u. Also, pt is in afib.)   Family stressors - mother in hospitalized just broke her femur, also having kidney trouble.   See prior note for details.  Anxiety > depression, Paxil 40mg  ineffective. Last visit we cross tapered off Paxil onto Lexapro 10mg  daily - he feels mood is some better, agrees to increase dose to 20mg .   OSA previously declined CPAP therapy - again declines this.   Plaque psoriasis - longterm on triamcinolone topical steroid. Last visit we transitioned to vit D analog calcipotriene BID and worked on dermatology referral - saw [facility] (Dr Ebony Cargo) 05/2023, note reviewed - 56.5% BSA affected, rec clobetasone 0.05% topical cream, s/p R elbow biopsy diagnosing psoriasis. Started on vit D2 50k weekly as well as started Cosentyx.   Dyslipidemia - off statin. Needs rpt FLP when truly fasting - not fasting today.   Permanent atrial fibrillation - rate controlled on digoxin, metoprolol succinate 25mg  BID, and xarelto. Last saw Dr Mariah Milling 06/09/2023.   Has been seeing [facility] urology for recurrent kidney stones, most recently L ureteral stone.   Chronic mass to right flank present for 5-6 yrs, may be enlarging. Painful with pressure. No redness, warmth, fever. 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 ~19cm diameter  Brittle teeth - has established with dentist. Discussed dietary calcium intake, also using fluoride. He is now taking weekly vit  D2 through dermatologist.      Relevant past medical, surgical, family and social history reviewed and updated as indicated. Interim medical history since our last visit reviewed. Allergies and medications reviewed and updated. Outpatient Medications Prior to Visit  Medication Sig Dispense Refill   clobetasol cream (TEMOVATE) 0.05 % Apply topically 2 (two) times daily.     DENTA 5000 PLUS 1.1 % CREA dental cream Take 1 Application by mouth daily.     digoxin (LANOXIN) 0.125 MG tablet Take 1 tablet (0.125 mg total) by mouth daily. 90 tablet 3   furosemide (LASIX) 20 MG tablet Take 1 tablet (20 mg total) by mouth as needed for edema. 90 tablet 3   metoprolol succinate (TOPROL-XL) 25 MG 24 hr tablet Take 1 tablet (25 mg total) by mouth in the morning and at bedtime. 180 tablet 3   rivaroxaban (XARELTO) 20 MG TABS tablet Take 1 tablet (20 mg total) by mouth daily with supper. 90 tablet 3   Secukinumab, 300 MG Dose, (COSENTYX, 300 MG DOSE,) 150 MG/ML SOSY Inject 2 mLs (300 mg total) into the skin every 30 (thirty) days.     triamcinolone cream (KENALOG) 0.1 % APPLY TO AFFECTED AREA TWICE A DAY AS DIRECTED 80 g 0   Vitamin D, Ergocalciferol, 50000 units CAPS Take 1 capsule by mouth once a week.     escitalopram (LEXAPRO) 10 MG tablet Take 1 tablet (10 mg total) by  mouth daily. 30 tablet 6   calcipotriene (DOVONOX) 0.005 % cream Apply topically 2 (two) times daily. 120 g 3   oxyCODONE-acetaminophen (PERCOCET/ROXICET) 5-325 MG tablet Take 1 tablet by mouth every 6 (six) hours as needed for severe pain. 15 tablet 0   tamsulosin (FLOMAX) 0.4 MG CAPS capsule Take 1 capsule (0.4 mg total) by mouth daily after supper. 14 capsule 0   No facility-administered medications prior to visit.     Per HPI unless specifically indicated in ROS section below Review of Systems  Objective:  BP 118/64   Pulse 97   Temp 97.9 F (36.6 C) (Oral)   Ht 5\' 8"  (1.727 m)   Wt 238 lb (108 kg)   SpO2 98%   BMI 36.19  kg/m   Wt Readings from Last 3 Encounters:  06/26/23 238 lb (108 kg)  06/09/23 240 lb 8 oz (109.1 kg)  06/01/23 234 lb 9.6 oz (106.4 kg)      Physical Exam Vitals and nursing note reviewed.  Constitutional:      Appearance: Normal appearance. He is not ill-appearing.  HENT:     Mouth/Throat:     Mouth: Mucous membranes are moist.     Pharynx: Oropharynx is clear. No oropharyngeal exudate or posterior oropharyngeal erythema.  Neck:     Thyroid: No thyroid mass or thyromegaly.  Cardiovascular:     Rate and Rhythm: Normal rate. Rhythm irregularly irregular.     Pulses: Normal pulses.     Heart sounds: No murmur heard. Pulmonary:     Effort: Pulmonary effort is normal. No respiratory distress.     Breath sounds: Normal breath sounds. No wheezing, rhonchi or rales.  Musculoskeletal:       Arms:     Comments: Large well circumscribed mass to R posterior chest wall, non-tender, ~19cm diameter  Skin:    General: Skin is warm and dry.     Findings: Rash present.     Comments: Postinflammatory hyperpigmentation at site of prior psoriatic rash  Neurological:     Mental Status: He is alert.  Psychiatric:        Mood and Affect: Mood normal.        Behavior: Behavior normal.       Lab Results  Component Value Date   HGBA1C 5.9 03/08/2023   Lab Results  Component Value Date   VD25OH 25.84 (L) 03/08/2023      06/26/2023    3:31 PM 03/15/2023   11:27 AM 12/27/2021   12:29 PM 06/10/2020    8:52 AM 08/14/2017    8:37 AM  Depression screen PHQ 2/9  Decreased Interest 1 2 2  0 0  Down, Depressed, Hopeless 1 1 1  0 0  PHQ - 2 Score 2 3 3  0 0  Altered sleeping 2 2 0 0 0  Tired, decreased energy 2 3 2  0 1  Change in appetite 0 1 0 0 0  Feeling bad or failure about yourself  0 1 0 0   Trouble concentrating 0 0 0 0 0  Moving slowly or fidgety/restless 0 0 0 0 0  Suicidal thoughts 0 0 0 0 0  PHQ-9 Score 6 10 5  0 1  Difficult doing work/chores Somewhat difficult Somewhat difficult  Not difficult at all  Somewhat difficult       06/26/2023    3:31 PM 03/15/2023   11:27 AM 12/27/2021   12:29 PM 06/10/2020    8:18 AM  GAD 7 : Generalized Anxiety Score  Nervous, Anxious, on Edge 3 3 0 0  Control/stop worrying 2 2 0 0  Worry too much - different things 3 2 0 0  Trouble relaxing 2 1 0 0  Restless 0 1 0 0  Easily annoyed or irritable 1 1 1  0  Afraid - awful might happen 2 0 0 0  Total GAD 7 Score 13 10 1  0  Anxiety Difficulty Somewhat difficult Somewhat difficult Not difficult at all     Assessment & Plan:   Problem List Items Addressed This Visit     Permanent atrial fibrillation (HCC)    Chronic, rate controlled on Toprol XL BID, xarelto and digoxin. Appreciate cards care.       GAD (generalized anxiety disorder) - Primary    Chronic anxiety > depression.  Doing ok on Lexapro 10mg  daily but notes ongoing difficulty with situational family stress.  Agrees to try higher Lexapro 20mg  dose - sent to pharmacy.       Relevant Medications   escitalopram (LEXAPRO) 20 MG tablet   Obstructive sleep apnea    H/o this, declines CPAP therapy or further evaluation at this time.       Dilated cardiomyopathy [facility])    H/o this followed by cardiology.       Plaque psoriasis    Appreciate dermatology care, established 04/2023 (Dr Ebony Cargo), started Cosentyx with significant benefit.       Lipoma of posterior chest wall    Refer to gen surgery for definitive treatment per pt request.       Relevant Orders   Ambulatory referral to General Surgery   Dyslipidemia    Recent trig markedly elevated (not truly fasting). Will return fasting for rpt FLP.  The 10-year ASCVD risk score (Arnett DK, et al., 2019) is: 5.3%   Values used to calculate the score:     Age: 5 years     Sex: Male     Is Non-Hispanic African American: No     Diabetic: No     Tobacco smoker: No     Systolic Blood Pressure: 118 mmHg     Is BP treated: No     HDL Cholesterol: 40.8 mg/dL      Total Cholesterol: 169 mg/dL       Relevant Orders   Lipid panel   Vitamin D deficiency    Now on oral vit D2 ergocalciferol 50k unit weekly replacement through dermatology       Relevant Orders   VITAMIN D 25 Hydroxy (Vit-D Deficiency, Fractures)   Financial insecurity   Nephrolithiasis    Appreciate urology care.       Other Visit Diagnoses     Encounter for immunization       Relevant Orders   Flu vaccine trivalent PF, 6mos and older(Flulaval,Afluria,Fluarix,Fluzone) (Completed)        Meds ordered this encounter  Medications   escitalopram (LEXAPRO) 20 MG tablet    Sig: Take 1 tablet (20 mg total) by mouth daily.    Dispense:  30 tablet    Refill:  6    Note new dose    Orders Placed This Encounter  Procedures   Flu vaccine trivalent PF, 6mos and older(Flulaval,Afluria,Fluarix,Fluzone)   Lipid panel    Standing Status:   Future    Standing Expiration Date:   06/25/2024   VITAMIN D 25 Hydroxy (Vit-D Deficiency, Fractures)    Standing Status:   Future    Standing Expiration Date:   06/25/2024  Ambulatory referral to General Surgery    Referral Priority:   Routine    Referral Type:   Surgical    Referral Reason:   Specialty Services Required    Requested Specialty:   General Surgery    Number of Visits Requested:   1    Patient Instructions  Flu shot today  Schedule fasting labs at your convenience (4 hour fast, ok to drink water, take medicines, [place] coffee). Increase dietary calcium (dairy products like milk, cheese, yogurt and leafy green vegetables).  Increase lexapro to 20mg  daily We will refer you to general surgeon for evaluation of lipoma removal  Return in 4 months for follow up visit   Follow up plan: Return in about 4 months (around 10/27/2023), or if symptoms worsen or fail to improve, for follow up visit.  [name], MD

## 2023-06-26 NOTE — Assessment & Plan Note (Signed)
Now on oral vit D2 ergocalciferol 50k unit weekly replacement through dermatology

## 2023-06-26 NOTE — Assessment & Plan Note (Signed)
Appreciate dermatology care, established 04/2023 (Dr Ebony Cargo), started Cosentyx with significant benefit.

## 2023-06-26 NOTE — Assessment & Plan Note (Signed)
Chronic, rate controlled on Toprol XL BID, xarelto and digoxin. Appreciate cards care.

## 2023-06-26 NOTE — Assessment & Plan Note (Signed)
Appreciate urology care.  ?

## 2023-06-26 NOTE — Assessment & Plan Note (Signed)
H/o this followed by cardiology.

## 2023-06-26 NOTE — Assessment & Plan Note (Signed)
Refer to gen surgery for definitive treatment per pt request.

## 2023-06-27 ENCOUNTER — Other Ambulatory Visit (INDEPENDENT_AMBULATORY_CARE_PROVIDER_SITE_OTHER): Payer: Medicaid Other

## 2023-06-27 ENCOUNTER — Ambulatory Visit: Payer: Medicaid Other | Admitting: Medical

## 2023-06-27 DIAGNOSIS — E559 Vitamin D deficiency, unspecified: Secondary | ICD-10-CM

## 2023-06-27 DIAGNOSIS — E785 Hyperlipidemia, unspecified: Secondary | ICD-10-CM | POA: Diagnosis not present

## 2023-06-27 LAB — LIPID PANEL
Cholesterol: 166 mg/dL (ref 0–200)
HDL: 44.4 mg/dL (ref >39.00)
LDL Cholesterol: 82 mg/dL (ref 0–99)
NonHDL: 121.17
Total CHOL/HDL Ratio: 4
Triglycerides: 196 mg/dL — ABNORMAL HIGH (ref 0.0–149.0)
VLDL: 39.2 mg/dL (ref 0.0–40.0)

## 2023-06-27 LAB — VITAMIN D 25 HYDROXY (VIT D DEFICIENCY, FRACTURES): VITD: 31.33 ng/mL (ref 30.00–100.00)

## 2023-06-29 LAB — CALCULI, WITH PHOTOGRAPH (CLINICAL LAB)
Calcium Oxalate Dihydrate: 30 %
Calcium Oxalate Monohydrate: 65 %
Hydroxyapatite: 5 %
Weight Calculi: 40 mg

## 2023-07-04 ENCOUNTER — Ambulatory Visit (INDEPENDENT_AMBULATORY_CARE_PROVIDER_SITE_OTHER): Payer: Medicaid Other | Admitting: General Surgery

## 2023-07-04 ENCOUNTER — Encounter: Payer: Self-pay | Admitting: General Surgery

## 2023-07-04 VITALS — BP 108/75 | HR 91 | Temp 98.0°F | Ht 68.0 in | Wt 237.0 lb

## 2023-07-04 DIAGNOSIS — D171 Benign lipomatous neoplasm of skin and subcutaneous tissue of trunk: Secondary | ICD-10-CM | POA: Diagnosis not present

## 2023-07-04 DIAGNOSIS — M7989 Other specified soft tissue disorders: Secondary | ICD-10-CM

## 2023-07-04 NOTE — Patient Instructions (Addendum)
We will get you scheduled for an MRI of this area.  We will have you follow up here after we get the results of your MRI.     You are scheduled for an MRI of the chest at [facility] on November 26th. You will need to arrive at the [facility] at 9:30 am. There is no prep for this test.

## 2023-07-04 NOTE — Progress Notes (Signed)
Patient ID: Stanley Kane, male   DOB: 31-Dec-1965, 57 y.o.   MRN: [id number] CC: Right Chest Wall Mass History of Present Illness Stanley Kane is a 57 y.o. male with past medical history significant for paroxysmal atrial fibrillation on metoprolol and Xarelto, plaque psoriasis presents today for evaluation of a right chest wall mass.  The patient reports that he noticed a growth on his right chest wall several years ago.  He says since then he has noticed that it is growing but the rate of growth has increased over the last year.  He has pain when he lies on his right side or puts pressure on the mass.  He denies any overlying skin changes.  He denies any unintentional weight loss..  Past Medical History Past Medical History:  Diagnosis Date   Anxiety    Chest pain    a. 09/2016 Cor Ca2+ scoring: 0; b. 08/2017 MV: No isch/infarct. EF 33-44%; c. 12/2019 MV: No isch/infarct. EF 46%; d. 11/2021 MV: No isch/infarct. Nl LVEF. No significant Ao/Cor Ca2+.   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 09/2014 Echo: EF 60-65%, mild MR/TR; b. 10/2017 Echo: EF 45-50%, no rwma, mildly dil LA, nl RV fxn.   Obesity    OSA (obstructive sleep apnea)    Persistent atrial fibrillation (HCC)    a. dx 2013 s/p DCCV; b. recurrent Afib 2016 s/p DCCV 11/25/2014; c. 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   hospitalization [facility] - stress with father's illness       Past Surgical History:  Procedure Laterality Date   CARDIOVASCULAR STRESS TEST  ~2005   thinks at [facility], told normal   carotid US  08/2014   no plaque   COLONOSCOPY  2000   WNL, for weight loss after started gym   EXTRACORPOREAL SHOCK WAVE LITHOTRIPSY Left 06/01/2023   Procedure: EXTRACORPOREAL SHOCK WAVE LITHOTRIPSY (ESWL);  Surgeon: Sondra Come, MD;   Location: [facility] ORS;  Service: Urology;  Laterality: Left;   MRI  08/2014   brain - WNL, skull base C1/2 articulation   TONSILLECTOMY AND ADENOIDECTOMY  childhood   US ECHOCARDIOGRAPHY  08/2014   EF 60%, nl vent fxn, mildly dilated LA, mild MR, mild TR    Allergies  Allergen Reactions   Vicodin [Hydrocodone-Acetaminophen]     Makes him "jittery"    Current Outpatient Medications  Medication Sig Dispense Refill   calcipotriene (DOVONOX) 0.005 % cream Apply 1 Application topically 2 (two) times daily.     DENTA 5000 PLUS 1.1 % CREA dental cream Take 1 Application by mouth daily.     digoxin (LANOXIN) 0.125 MG tablet Take 1 tablet (0.125 mg total) by mouth daily. 90 tablet 3   escitalopram (LEXAPRO) 20 MG tablet Take 1 tablet (20 mg total) by mouth daily. 30 tablet 6   furosemide (LASIX) 20 MG tablet Take 1 tablet (20 mg total) by mouth as needed for edema. 90 tablet 3   metoprolol succinate (TOPROL-XL) 25 MG 24 hr tablet Take 1 tablet (25 mg total) by mouth in the morning and at bedtime. 180 tablet 3   rivaroxaban (XARELTO) 20 MG TABS tablet Take 1 tablet (20 mg total) by mouth daily with supper. 90 tablet 3  Secukinumab, 300 MG Dose, (COSENTYX, 300 MG DOSE,) 150 MG/ML SOSY Inject 2 mLs (300 mg total) into the skin every 30 (thirty) days.     triamcinolone cream (KENALOG) 0.1 % APPLY TO AFFECTED AREA TWICE A DAY AS DIRECTED 80 g 0   Vitamin D, Ergocalciferol, 50000 units CAPS Take 1 capsule by mouth once a week.     No current facility-administered medications for this visit.    Family History Family History  Problem Relation Age of Onset   Hypertension Father    Coronary artery disease Father 62   Heart attack Father    Cirrhosis Father    Hypercalcemia Father    Diabetes Mother    Heart disease Mother    CAD Sister 17       deceased from MI   GI Disease Sister    Stroke Neg Hx        Social History Social History   Tobacco Use   Smoking status: Former    Current  packs/day: 0.00    Types: Cigarettes    Quit date: 08/29/1996    Years since quitting: 26.8    Passive exposure: Past   Smokeless tobacco: Never  Vaping Use   Vaping status: Never Used  Substance Use Topics   Alcohol use: Not Currently    Alcohol/week: 2.0 standard drinks of alcohol    Types: 2 Cans of beer per week    Comment: quit beginning of 2020   Drug use: Yes    Types: Cocaine        ROS Full ROS of systems performed and is otherwise negative there than what is stated in the HPI  Physical Exam Blood pressure 108/75, pulse 91, temperature 98 F (36.7 C), height 5\' 8"  (1.727 m), weight 237 lb (107.5 kg), SpO2 97%.  Acute distress, PERRLA, moving all extremities spontaneously, normal work of breathing on room air. On his right chest wall beginning approximately at the mid axillary line and extending posteriorly there is a rather large but soft mass.  It does not feel to be adhered to the underlying tissue.  There is no pain with palpation of the mass.  There are no overlying skin changes  Data Reviewed I reviewed his recent CT scan that was done in June 2024.  This was a renal scan but you can see that there is areas of lipomatous changes at the right chest wall with some fat necrosis.  I went back and reviewed his CT abdomen pelvis from 2017 and there does not seem to be any evidence of a lipoma at that time.  I have personally reviewed the patient's imaging and medical records.    Assessment    Stanley Kane is a 56 year old with a large chest wall lipoma that has been growing with an increased rate of growth over the last year.  Plan    Given that the lipoma seems to measure upwards of 15 to 18 cm I think it is important that we get an MRI to rule out any features of sarcoma.  I discussed with them that given its size no matter if it is benign or malignant then we should remove it.  I also told him that given his heart history and he is on Xarelto and the size of the lipoma  that this would need to be done in the operating room.  We will first get this MRI and then bring him back in to discuss surgical options.  He would need  to be off Xarelto for 2 days prior to any planned procedure    Kandis Cocking 07/04/2023, 9:07 AM

## 2023-07-25 ENCOUNTER — Ambulatory Visit
Admission: RE | Admit: 2023-07-25 | Discharge: 2023-07-25 | Disposition: A | Discharge status: Home / Self Care | Payer: Medicaid Other | Source: Ambulatory Visit | Attending: General Surgery | Admitting: General Surgery

## 2023-07-25 DIAGNOSIS — D171 Benign lipomatous neoplasm of skin and subcutaneous tissue of trunk: Secondary | ICD-10-CM | POA: Insufficient documentation

## 2023-07-25 DIAGNOSIS — M7989 Other specified soft tissue disorders: Secondary | ICD-10-CM | POA: Insufficient documentation

## 2023-07-25 DIAGNOSIS — R222 Localized swelling, mass and lump, trunk: Secondary | ICD-10-CM | POA: Diagnosis not present

## 2023-07-25 HISTORY — DX: Benign lipomatous neoplasm of skin and subcutaneous tissue of trunk: D17.1

## 2023-07-25 MED ORDER — GADOBUTROL 1 MMOL/ML IV SOLN
10.0000 mL | Freq: Once | INTRAVENOUS | Status: AC | PRN
  Administered 2023-07-25: 10 mL via INTRAVENOUS

## 2023-08-03 ENCOUNTER — Ambulatory Visit: Payer: Medicaid Other | Admitting: General Surgery

## 2023-08-03 ENCOUNTER — Ambulatory Visit: Payer: Self-pay | Admitting: General Surgery

## 2023-08-03 ENCOUNTER — Telehealth: Payer: Self-pay

## 2023-08-03 ENCOUNTER — Encounter: Payer: Self-pay | Admitting: General Surgery

## 2023-08-03 ENCOUNTER — Telehealth: Payer: Self-pay | Admitting: Cardiovascular Disease

## 2023-08-03 ENCOUNTER — Telehealth: Payer: Self-pay | Admitting: General Surgery

## 2023-08-03 VITALS — BP 109/66 | HR 86 | Temp 98.0°F | Ht 68.0 in | Wt 241.0 lb

## 2023-08-03 DIAGNOSIS — D171 Benign lipomatous neoplasm of skin and subcutaneous tissue of trunk: Secondary | ICD-10-CM

## 2023-08-03 DIAGNOSIS — R222 Localized swelling, mass and lump, trunk: Secondary | ICD-10-CM

## 2023-08-03 NOTE — Telephone Encounter (Signed)
   Pre-operative Risk Assessment    Patient Name: Stanley Kane  DOB: [birth year] MRN: [id number]      Request for Surgical Clearance    Procedure:  Excision right chest wall lipoma  Date of Surgery:  Clearance 08/14/23                                 Surgeon:  Dr. Baker Pierini Surgeon's Group or Practice Name:  [facility] Phone number:  [phone] Fax number:  [phone]   Type of Clearance Requested:   - Medical  Pharmacy, Zarelto   Type of Anesthesia:  General    Additional requests/questions:    Signed, Lauralee Evener V   08/03/2023, 11:45 AM

## 2023-08-03 NOTE — Telephone Encounter (Signed)
Faxed cardiac clearance to Dr. Cornelius Moras at [phone].

## 2023-08-03 NOTE — Progress Notes (Signed)
Outpatient Surgical Follow Up  08/03/2023  Stanley Kane is an 57 y.o. male.   Chief Complaint  Patient presents with   Follow-up    Right chest wall lipoma    HPI: Stanley Kane is a 57 year old with past medical history significant for atrial fibrillation on Xarelto who returns in follow-up for a right chest wall mass.  He reports that he continues to have mild symptoms from his mass.  This is worse when he lays on it he will get some pain.  He also is annoyed by the appearance of the large mass.  He has had an MRI since his last visit.  The MRI shows large likely benign lipoma with some central fat necrosis  He denies any shortness of breath and says that he can walk up a flight of stairs without getting short of breath.  Past Medical History:  Diagnosis Date   Anxiety    Chest pain    a. 09/2016 Cor Ca2+ scoring: 0; b. 08/2017 MV: No isch/infarct. EF 33-44%; c. 12/2019 MV: No isch/infarct. EF 46%; d. 11/2021 MV: No isch/infarct. Nl LVEF. No significant Ao/Cor Ca2+.   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 09/2014 Echo: EF 60-65%, mild MR/TR; b. 10/2017 Echo: EF 45-50%, no rwma, mildly dil LA, nl RV fxn.   Obesity    OSA (obstructive sleep apnea)    Persistent atrial fibrillation (HCC)    a. dx 2013 s/p DCCV; b. recurrent Afib 2016 s/p DCCV 11/25/2014; c. 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   hospitalization [facility] - stress with father's illness    Past Surgical History:  Procedure Laterality Date   CARDIOVASCULAR STRESS TEST  ~2005   thinks at [facility], told normal   carotid US  08/2014   no plaque   COLONOSCOPY  2000   WNL, for weight loss after started gym   EXTRACORPOREAL SHOCK WAVE LITHOTRIPSY Left 06/01/2023   Procedure: EXTRACORPOREAL SHOCK WAVE LITHOTRIPSY (ESWL);  Surgeon:  Sondra Come, MD;  Location: [facility] ORS;  Service: Urology;  Laterality: Left;   MRI  08/2014   brain - WNL, skull base C1/2 articulation   TONSILLECTOMY AND ADENOIDECTOMY  childhood   US ECHOCARDIOGRAPHY  08/2014   EF 60%, nl vent fxn, mildly dilated LA, mild MR, mild TR    Family History  Problem Relation Age of Onset   Hypertension Father    Coronary artery disease Father 31   Heart attack Father    Cirrhosis Father    Hypercalcemia Father    Diabetes Mother    Heart disease Mother    CAD Sister 41       deceased from MI   GI Disease Sister    Stroke Neg Hx     Social History:  reports that he quit smoking about 26 years ago. His smoking use included cigarettes. He has been exposed to tobacco smoke. He has never used smokeless tobacco. He reports that he does not currently use alcohol after a past usage of about 2.0 standard drinks of alcohol per week. He reports current drug use. Drug: Cocaine.  Allergies:  Allergies  Allergen Reactions   Vicodin [Hydrocodone-Acetaminophen]     Makes him "jittery"    Medications reviewed.  ROS Full ROS performed and is otherwise negative other than what is stated in HPI   BP 109/66   Pulse 86   Temp 98 F (36.7 C) (Oral)   Ht 5\' 8"  (1.727 m)   Wt 241 lb (109.3 kg)   SpO2 95%   BMI 36.64 kg/m   Physical Exam Alert and oriented x 3, clear to auscultation bilaterally, regular rate on exam today.  On his right chest wall he has a large soft mobile mass without overlying skin changes.   I have reviewed his MRI.  There is a large fat appearing mass with some central necrosis on his right chest wall.  There are no internal septations or irregularity that would be concerning for malignant process.   Assessment/Plan: 57 year old male with large right chest wall that on physical exam and imaging seems to be a lipoma.  He does have a history of atrial fibrillation and is on anticoagulation.  Given the size of the mass and his  medical comorbidities I think that the best option for him is to go to the operating room for removal.  I have discussed the risk, benefits alternatives of the procedure including risk of infection, bleeding, malignant process, recurrence of the mass.  I have also discussed with him that there is a high likelihood that he will have a drain placed into the empty space after the resection of the mass.  We will ask his cardiologist for cardiac restratification.  We will hold his Xarelto 48 hours prior to the procedure.  Baker Pierini, M.D. [facility]

## 2023-08-03 NOTE — Patient Instructions (Signed)
Our surgery scheduler Britta Mccreedy will call you within 24-48 hours to get you scheduled. If you have not heard from her after 48 hours, please call our office. Have the blue sheet available when she calls to write down important information.  You have glue on your skin and sutures under the skin. The glue will come off on it's own in 10-14 days. You may shower normally until this occurs but do not submerge.  Please use Tylenol or Ibuprofen for pain as needed. You may use ice to the area 3-4 times today and tomorrow for any achiness.   We will see you back in 7-10 days to ensure that this has healed and to review the final pathology. Please see your appointment below. You may continue your regular activities right away but if you are having pain while doing something, stop what you are doing and try this activity once again in 3 days. Please call our office with any questions or concerns prior to your appointment.   Lipoma Removal Lipoma removal is a surgical procedure to remove a noncancerous (benign) tumor that is made up of fat cells (lipoma). Most lipomas are small and painless and do not require treatment. They can form in many areas of the body but are most common under the skin of the back, shoulders, arms, and thighs. You may need lipoma removal if you have a lipoma that is large, growing, or causing discomfort. Lipoma removal may also be done for cosmetic reasons. Tell a health care provider about: Any allergies you have. All medicines you are taking, including vitamins, herbs, eye drops, creams, and over-the-counter medicines. Any problems you or family members have had with anesthetic medicines. Any blood disorders you have. Any surgeries you have had. Any medical conditions you have. Whether you are pregnant or may be pregnant. What are the risks? Generally, this is a safe procedure. However, problems may occur, including: Infection. Bleeding. Allergic reactions to medicines. Damage  to nerves or blood vessels near the lipoma. Scarring.  Medicines Ask your health care provider about: Changing or stopping your regular medicines. This is especially important if you are taking diabetes medicines or blood thinners. Taking medicines such as aspirin and ibuprofen. These medicines can thin your blood. Do not take these medicines before your procedure if your health care provider instructs you not to. You may be given antibiotic medicine to help prevent infection. General instructions Ask your health care provider how your surgical site will be marked or identified. You will have a physical exam. Your health care provider will check the size of the lipoma and whether it can be moved easily.  What happens during the procedure? To reduce your risk of infection: Your health care team will wash or sanitize their hands. Your skin will be washed with surgical soap. You will be given the following: A medicine to numb the area (local anesthetic). An incision will be made over the lipoma or very near the lipoma. The incision may be made in a natural skin line or crease. Tissues, nerves, and blood vessels near the lipoma will be moved out of the way. The lipoma and the capsule that surrounds it will be separated from the surrounding tissues. The lipoma will be removed. The incision may be closed with stitches and surgical glue

## 2023-08-03 NOTE — Telephone Encounter (Signed)
Patient has been advised of Pre-Admission date/time, and Surgery date at [facility].  Surgery Date: 08/14/23 Preadmission Testing Date: 08/08/23 (phone 8a-1p)  Patient has been made aware to call [phone], between 1-3:00pm the day before surgery, to find out what time to arrive for surgery.

## 2023-08-04 NOTE — Telephone Encounter (Signed)
Patient with diagnosis of atrial fibrillation on Xarelto for anticoagulation.    Procedure: Excision right chest wall lipoma  Date of procedure: 08/14/2023   CHA2DS2-VASc Score = 1   This indicates a 0.6% annual risk of stroke. The patient's score is based upon: CHF History: 1 HTN History: 0 Diabetes History: 0 Stroke History: 0 Vascular Disease History: 0 Age Score: 0 Gender Score: 0       CrCl >100 mL/min Platelet count 364 K    Per office protocol, patient can hold Xarelto for 1-2 days prior to procedure.     **This guidance is not considered finalized until pre-operative APP has relayed final recommendations.**

## 2023-08-04 NOTE — Telephone Encounter (Signed)
     Primary Cardiologist: Julien Nordmann, MD  Chart reviewed as part of pre-operative protocol coverage by clinical pharmacist.  The following recommendations were provided for Stanley Kane   Patient with diagnosis of atrial fibrillation on Xarelto for anticoagulation.     Procedure: Excision right chest wall lipoma  Date of procedure: 08/14/2023     CHA2DS2-VASc Score = 1   This indicates a 0.6% annual risk of stroke. The patient's score is based upon: CHF History: 1 HTN History: 0 Diabetes History: 0 Stroke History: 0 Vascular Disease History: 0 Age Score: 0 Gender Score: 0        CrCl >100 mL/min Platelet count 364 K       Per office protocol, patient can hold Xarelto for 1-2 days prior to procedure.  I will route this recommendation to the requesting party via Epic fax function and remove from pre-op pool.  Thomasene Ripple. Latori Beggs NP-C     08/04/2023, 8:42 AM [facility] [address] Office [phone] Fax [phone]

## 2023-08-08 ENCOUNTER — Telehealth: Payer: Self-pay | Admitting: Family Medicine

## 2023-08-08 ENCOUNTER — Encounter
Admission: RE | Admit: 2023-08-08 | Discharge: 2023-08-08 | Disposition: A | Discharge status: Home / Self Care | Payer: Medicaid Other | Source: Ambulatory Visit | Attending: General Surgery

## 2023-08-08 ENCOUNTER — Encounter: Payer: Self-pay | Admitting: Urgent Care

## 2023-08-08 ENCOUNTER — Other Ambulatory Visit: Payer: Self-pay

## 2023-08-08 ENCOUNTER — Encounter
Admission: RE | Admit: 2023-08-08 | Discharge: 2023-08-08 | Disposition: A | Discharge status: Home / Self Care | Payer: Medicaid Other | Source: Ambulatory Visit | Attending: General Surgery | Admitting: General Surgery

## 2023-08-08 DIAGNOSIS — D171 Benign lipomatous neoplasm of skin and subcutaneous tissue of trunk: Secondary | ICD-10-CM | POA: Diagnosis not present

## 2023-08-08 DIAGNOSIS — Z01812 Encounter for preprocedural laboratory examination: Secondary | ICD-10-CM | POA: Diagnosis not present

## 2023-08-08 DIAGNOSIS — F411 Generalized anxiety disorder: Secondary | ICD-10-CM

## 2023-08-08 DIAGNOSIS — Z0001 Encounter for general adult medical examination with abnormal findings: Secondary | ICD-10-CM

## 2023-08-08 HISTORY — DX: Personal history of urinary calculi: Z87.442

## 2023-08-08 HISTORY — DX: Myoneural disorder, unspecified: G70.9

## 2023-08-08 LAB — BASIC METABOLIC PANEL
Anion gap: 9 (ref 5–15)
BUN: 14 mg/dL (ref 6–20)
CO2: 24 mmol/L (ref 22–32)
Calcium: 8.6 mg/dL — ABNORMAL LOW (ref 8.9–10.3)
Chloride: 101 mmol/L (ref 98–111)
Creatinine, Ser: 0.92 mg/dL (ref 0.61–1.24)
GFR, Estimated: >60 mL/min (ref >60)
Glucose, Bld: 88 mg/dL (ref 70–99)
Potassium: 3.8 mmol/L (ref 3.5–5.1)
Sodium: 134 mmol/L — ABNORMAL LOW (ref 135–145)

## 2023-08-08 LAB — CBC
HCT: 41.5 % (ref 39.0–52.0)
Hemoglobin: 14.7 g/dL (ref 13.0–17.0)
MCH: 31.7 pg (ref 26.0–34.0)
MCHC: 35.4 g/dL (ref 30.0–36.0)
MCV: 89.4 fL (ref 80.0–100.0)
Platelets: 322 10*3/uL (ref 150–400)
RBC: 4.64 MIL/uL (ref 4.22–5.81)
RDW: 12.8 % (ref 11.5–15.5)
WBC: 8.2 10*3/uL (ref 4.0–10.5)
nRBC: 0 % (ref 0.0–0.2)

## 2023-08-08 MED ORDER — ESCITALOPRAM OXALATE 20 MG PO TABS: 20.0000 mg | ORAL_TABLET | Freq: Every day | ORAL | 4 refills | Status: DC

## 2023-08-08 NOTE — Telephone Encounter (Signed)
Prescription Request  08/08/2023  LOV: 06/26/2023  What is the name of the medication or equipment? escitalopram (LEXAPRO) 20 MG tablet   Have you contacted your pharmacy to request a refill? No   Which pharmacy would you like this sent to?  [id number] [place], [place] - [address] AT [facility] OF [address] [place] [address] Phone: [phone] Fax: [phone]    Patient notified that their request is being sent to the clinical staff for review and that they should receive a response within 2 business days.   Please advise at Mobile [phone] (mobile)

## 2023-08-08 NOTE — Patient Instructions (Addendum)
Your procedure is scheduled on: Monday, 08/14/2023  Report to the Registration Desk on the 1st floor of the Medical Mall. To find out your arrival time, please call [phone] between 1PM - 3PM on:  Friday, 08/11/2023  If your arrival time is 6:00 am, do not arrive before that time as the Medical [facility] entrance doors do not open until 6:00 am.  REMEMBER: Instructions that are not followed completely may result in serious medical risk, up to and including death; or upon the discretion of your surgeon and anesthesiologist your surgery may need to be rescheduled.  Do not eat food after midnight the night before surgery.  No gum chewing or hard candies.   One week prior to surgery: Stop Anti-inflammatories (NSAIDS) such as Advil, Aleve, Ibuprofen, Motrin, Naproxen, Naprosyn and Aspirin based products such as Excedrin, Goody's Powder, BC Powder. Stop ANY OVER THE COUNTER supplements until after surgery.  You may however, continue to take Tylenol if needed for pain up until the day of surgery.    **Follow recommendations regarding stopping blood thinners.**  Hold Xarelto1-2 days prior to surgery. Last dose is Dec. 13, 2024 . Please follow instructions by your doctor.  Continue taking all of your other prescription medications up until the day of surgery.  ON THE DAY OF SURGERY ONLY TAKE THESE MEDICATIONS WITH SIPS OF WATER:  digoxin (LANOXIN) escitalopram (LEXAPRO) 3. metoprolol succinate    No Alcohol for 24 hours before or after surgery.  No Smoking including e-cigarettes for 24 hours before surgery.  No chewable tobacco products for at least 6 hours before surgery.  No nicotine patches on the day of surgery.  Do not use any "recreational" drugs for at least a week (preferably 2 weeks) before your surgery.  Please be advised that the combination of cocaine and anesthesia may have negative outcomes, up to and including death. If you test positive for cocaine, your surgery will  be cancelled.  On the morning of surgery brush your teeth with toothpaste and water, you may rinse your mouth with mouthwash if you wish. Do not swallow any toothpaste or mouthwash.  Use CHG Soap or wipes as directed on instruction sheet.-provided for you   Do not wear jewelry, make-up, hairpins, clips or nail polish.  For welded (permanent) jewelry: bracelets, anklets, waist bands, etc.  Please have this removed prior to surgery.  If it is not removed, there is a chance that hospital personnel will need to cut it off on the day of surgery.  Do not wear lotions, powders, or perfumes.   Do not shave body hair from the neck down 48 hours before surgery.  Contact lenses, hearing aids and dentures may not be worn into surgery.  Do not bring valuables to the hospital. [facility] is not responsible for any missing/lost belongings or valuables.    Notify your doctor if there is any change in your medical condition (cold, fever, infection).  Wear comfortable clothing (specific to your surgery type) to the hospital.  After surgery, you can help prevent lung complications by doing breathing exercises.  Take deep breaths and cough every 1-2 hours. Your doctor may order a device called an Incentive Spirometer to help you take deep breaths. If you are being admitted to the hospital overnight, leave your suitcase in the car. After surgery it may be brought to your room.   If you are being discharged the day of surgery, you will not be allowed to drive home. You will need a  responsible individual to drive you home and stay with you for 24 hours after surgery.   If you are taking public transportation, you will need to have a responsible individual with you.  Please call the Pre-admissions Testing Dept. at [phone] if you have any questions about these instructions.  Surgery Visitation Policy:  Patients having surgery or a procedure may have two visitors.  Children under the age of 28  must have an adult with them who is not the patient.

## 2023-08-08 NOTE — Telephone Encounter (Signed)
Rx sent 06/06/23, #30/6 additional refills to [facility].  Spoke with pt relaying info above. Pt states he now uses [facility] due to insurance change.   E-scribed rx to [facility].

## 2023-08-09 ENCOUNTER — Encounter: Payer: Self-pay | Admitting: General Surgery

## 2023-08-09 NOTE — Progress Notes (Signed)
Perioperative / Anesthesia Services  Pre-Admission Testing Clinical Review / Pre-Operative Anesthesia Consult  Date: 08/11/23  Patient Demographics:  Name: Stanley Kane DOB:   09-18-1965 MRN:   [id number]  Planned Surgical Procedure(s):    Case: [id number] Date/Time: 08/14/23 0715   Procedure: EXCISION LIPOMA, chest wall (Right)   Anesthesia type: General   Pre-op diagnosis: right chest wall lipoma   Location: [facility] OR ROOM 08 / [facility]S FOR [facility]EST[facility]   Surgeons: Kandis Cocking, MD     NOTE: Available PAT nursing documentation and vital signs have been reviewed. Clinical nursing staff has updated patient's PMH/PSHx, current medication list, and drug allergies/intolerances to ensure comprehensive history available to assist in medical decision making as it pertains to the aforementioned surgical procedure and anticipated anesthetic course. Extensive review of available clinical information personally performed. [place] [facility] and PSHx updated with any diagnoses/procedures that  may have been inadvertently omitted during his intake with the pre-admission testing department's nursing staff.  Clinical Discussion:  Stanley Kane is a 57 y.o. male who is submitted for pre-surgical anesthesia review and clearance prior to him undergoing the above procedure. Patient is a Former Smoker (quit 08/1996). 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  Patient is followed by cardiology Mariah Milling, MD). He was last seen in the cardiology clinic on 06/09/2023; notes reviewed. At the time of his clinic visit, patient doing well overall from a cardiovascular perspective.  Patient reported chronic shortness of breath that was reported to be stable and at baseline. Patient denied any chest pain, shortness of breath, PND,  orthopnea, palpitations, significant peripheral edema, weakness, fatigue, vertiginous symptoms, or presyncope/syncope. Patient with a past medical history significant for cardiovascular diagnoses. Documented physical exam was grossly benign, providing no evidence of acute exacerbation and/or decompensation of the patient's known cardiovascular conditions.  Coronary CTA was performed on 10/17/2016 that demonstrated an Agatston coronary artery calcium score of 0.   Most recent myocardial perfusion imaging study was performed on 11/29/2021 revealing a normal left ventricular systolic function with an EF of 60%.  There was no evidence of stress-induced myocardial ischemia or arrhythmia; no scintigraphic evidence of scar.  CT attenuation correction images revealed no significant coronary artery or aortic calcifications.  Study determined to be normal and low risk.  Most recent TTE was performed on 04/13/2023 revealing a normal left ventricular systolic function with EF of 60 to 65%.  There were no regional wall motion abnormalities.  Right ventricular size and function was normal.  Left atrium was moderately dilated.  There was mild mitral valve regurgitation. All transvalvular gradients were noted to be normal providing no evidence suggestive of valvular stenosis. Aorta normal in size with no evidence of aneurysmal dilatation.  Patient with an atrial fibrillation diagnosis; CHA2DS2-VASc Score = 1 (HFmrEF).patient underwent DCCV procedure on 07/06/2012, at which time he received a single 150 J synchronized cardioversion restoring NSR.  Due to refractory atrial arrhythmia, patient underwent repeat DCCV procedure on 11/25/2014, again receiving a single 150 J synchronized cardioversion converting him to NSR. His rate and rhythm are currently being maintained on oral digoxin + metoprolol succinate. He is chronically anticoagulated using rivaroxaban; reported to be compliant with therapy with no evidence or reports of  GI/GU bleeding.  Patient with a nonischemic cardiomyopathy/HFmrEF diagnosis that is being well-controlled on currently prescribed cardiac glycoside (digoxin), diuretic (furosemide, and beta-blocker (metoprolol succinate)  therapies.  Blood pressure well-controlled at 110/80 mmHg.  Patient is not currently taking any type of lipid-lowering therapies.  He is not diabetic.  Patient does not have an OSAH diagnosis. Patient is able to complete all of his ADL/IADLs without cardiovascular limitation.  Per the DASI, patient is able to achieve at least 4 METS of physical activity without experiencing any significant degree of angina/anginal equivalent symptoms.  No changes were made to his medication regimen.  Patient to follow-up with outpatient cardiology in 1 year or sooner if needed.  Stanley Kane is scheduled for an elective RIGHT CHEST WALL LIPOMA EXCISION  on 08/14/2023 with Dr. Baker Pierini, MD.  Given patient's past medical history significant for cardiovascular diagnoses, presurgical cardiac clearance was sought by the PAT team. Per cardiology, "based ACC/AHA guidelines, the patient's past medical history, and the amount of time since his last clinic visit, this patient would be at an overall ACCEPTABLE risk for the planned procedure without further cardiovascular testing or intervention at this time".   Again, this patient is on daily oral anticoagulation therapy using a DOAC medication.  He has been instructed on recommendations for holding his rivaroxaban for 2 days prior to his procedure with plans to restart as soon as postoperative bleeding risk felt to be minimized by his attending surgeon. The patient has been instructed that his last dose of his rivaroxaban should be on 08/11/2023.  Patient denies previous perioperative complications with anesthesia in the past. In review his EMR, there are no records available for review pertaining to any anesthetic courses within the [facility] system in the  recent past.      08/03/2023    8:45 AM 07/04/2023    8:39 AM 06/26/2023    2:57 PM  Vitals with BMI  Height 5\' 8"  5\' 8"  5\' 8"   Weight 241 lbs 237 lbs 238 lbs  BMI 36.65 36.04 36.2  Systolic 109 108 161  Diastolic 66 75 64  Pulse 86 91 97    Providers/Specialists:   NOTE: Primary physician provider listed below. Patient may have been seen by APP or partner within same practice.   PROVIDER ROLE / SPECIALTY LAST OV  Kandis Cocking, MD General Surgery (Surgeon)  08/03/2023  [name], MD Primary Care Provider 06/26/2023  Julien Nordmann, MD Cardiology 06/09/2023; update call with preop APP on 08/04/2023   Allergies:  Vicodin [hydrocodone-acetaminophen]  Current Home Medications:   No current facility-administered medications for this encounter.    DENTA 5000 PLUS 1.1 % CREA dental cream   digoxin (LANOXIN) 0.125 MG tablet   furosemide (LASIX) 20 MG tablet   hydroxypropyl methylcellulose / hypromellose (ISOPTO TEARS / GONIOVISC) 2.5 % ophthalmic solution   metoprolol succinate (TOPROL-XL) 25 MG 24 hr tablet   rivaroxaban (XARELTO) 20 MG TABS tablet   Secukinumab, 300 MG Dose, (COSENTYX, 300 MG DOSE,) 150 MG/ML SOSY   triamcinolone cream (KENALOG) 0.1 %   Vitamin D, Ergocalciferol, 50000 units CAPS   escitalopram (LEXAPRO) 20 MG tablet   History:   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)    a.) TTE 05/22/2012: EF 50-55%, no RWMAs, mild LAE, norm RVSF, mild MR; b.) TTE 09/26/2017: EF  60-65%, no RWMAs, mild LAE, norm RVSF, triv PR, mild MR/TR; c.) TTE 11/23/2017: EF 45-50%, no RWMAs, mild LAE, norm RVSF, triv MR/PR, mild TR; d.) TTE 04/13/2023: EF 60-65%, no RWMAs, mod LAE,  norm RVSF, mild MR   Depression    Fatty liver 06/2014   History of kidney stones    Kidney stone    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   a.) MRI chest 07/25/2023: 12.1 x 11.8 x 9.4 cm circumscribed mass to RIGHT chest and lower axilla; overlies oblique musculature and anterior latissimus dorsi   Long-term current use of immunosuppressive biologic agent    a.) secukinumab   NICM (nonischemic cardiomyopathy) (HCC)    a.) EF 05/22/2012: EF 50-55%; b.) TTE 09/26/2014: EF 60-65%; c.) MV 09/06/2017: EF 33-44%; d.) TTE 11/23/2017: EF 45-50%; e.) MV 01/15/2020: EF 45%; f.) MV 11/29/2021: EF 60%; g.) TTE 04/13/2023: EF 60-65%   Obesity    On rivaroxaban therapy    OSA (obstructive sleep apnea)    a.) does not utilize nocturnal PAP therapy   Persistent atrial fibrillation (HCC) 2013   a.) Dx'd in 2013; b.) CHA2DS2-VASc = 1 (HFmrEF) as of 08/09/2023; b.) s/p DCCV 07/06/2012 (150 J x1); c.) s/p DCCV 11/25/2014 (150 J x 1); d.) cardiac rate/rhythm maintained on oral digoxin + metoprolol succinate; chronically anticoagulated using rivaroxaban   Plaque psoriasis    a.) Tx'd with secukinumab injections + topical triamcinolone   Prediabetes    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   hospitalization [facility] - stress with father's illness   Vitamin D deficiency    Past Surgical History:  Procedure Laterality Date   CARDIOVASCULAR STRESS TEST  ~2005   thinks at [facility], told normal   carotid US  08/2014   no plaque   COLONOSCOPY  2000   WNL, for weight loss after started gym   EXTRACORPOREAL SHOCK WAVE LITHOTRIPSY Left 06/01/2023   Procedure: EXTRACORPOREAL SHOCK WAVE LITHOTRIPSY (ESWL);  Surgeon: Sondra Come, MD;  Location: [facility] ORS;  Service: Urology;  Laterality: Left;   MRI  08/2014   brain - WNL, skull base C1/2 articulation   TONSILLECTOMY AND ADENOIDECTOMY  childhood   US ECHOCARDIOGRAPHY  08/2014   EF 60%, nl vent fxn, mildly dilated LA, mild MR, mild TR   Family History  Problem Relation Age of  Onset   Hypertension Father    Coronary artery disease Father 87   Heart attack Father    Cirrhosis Father    Hypercalcemia Father    Diabetes Mother    Heart disease Mother    CAD Sister 48       deceased from MI   GI Disease Sister    Stroke Neg Hx    Social History   Tobacco Use   Smoking status: Former    Current packs/day: 0.00    Types: Cigarettes    Quit date: 08/29/1996    Years since quitting: 26.9    Passive exposure: Past   Smokeless tobacco: Never  Vaping Use   Vaping status: Never Used  Substance Use Topics   Alcohol use: Not Currently    Alcohol/week: 2.0 standard drinks of alcohol    Types: 2 Cans of beer per week    Comment: quit beginning of 2020   Drug use: Yes    Types: Cocaine    Pertinent Clinical Results:  LABS:   No visits with results within 3 Day(s) from this visit.  Latest known visit with results is:  Hospital Outpatient Visit  on 08/08/2023  Component Date Value Ref Range Status   Sodium 08/08/2023 134 (L)  135 - 145 mmol/L Final   Potassium 08/08/2023 3.8  3.5 - 5.1 mmol/L Final   Chloride 08/08/2023 101  98 - 111 mmol/L Final   CO2 08/08/2023 24  22 - 32 mmol/L Final   Glucose, Bld 08/08/2023 88  70 - 99 mg/dL Final   Glucose reference range applies only to samples taken after fasting for at least 8 hours.   BUN 08/08/2023 14  6 - 20 mg/dL Final   Creatinine, Ser 08/08/2023 0.92  0.61 - 1.24 mg/dL Final   Calcium 40/98/1191 8.6 (L)  8.9 - 10.3 mg/dL Final   GFR, Estimated 08/08/2023 >60  >60 mL/min Final   Comment: (NOTE) Calculated using the CKD-EPI Creatinine Equation (2021)    Anion gap 08/08/2023 9  5 - 15 Final   Performed at [facility], [address] [place], [place] [address]   WBC 08/08/2023 8.2  4.0 - 10.5 K/uL Final   RBC 08/08/2023 4.64  4.22 - 5.81 MIL/uL Final   Hemoglobin 08/08/2023 14.7  13.0 - 17.0 g/dL Final   HCT 56/21/3086 41.5  39.0 - 52.0 % Final   MCV 08/08/2023 89.4  80.0 - 100.0 fL Final    MCH 08/08/2023 31.7  26.0 - 34.0 pg Final   MCHC 08/08/2023 35.4  30.0 - 36.0 g/dL Final   RDW 57/84/6962 12.8  11.5 - 15.5 % Final   Platelets 08/08/2023 322  150 - 400 K/uL Final   nRBC 08/08/2023 0.0  0.0 - 0.2 % Final   Performed at [facility], [address] [place], [place] [address]    ECG: Date: 06/09/2023 Time ECG obtained: 1001 AM Rate: 69 bpm Rhythm: atrial fibrillation Axis (leads I and aVF): Normal Intervals: QRS 92 ms. QTc 402 ms. ST segment and T wave changes: No evidence of acute ST segment elevation or depression.   Comparison: Similar to previous tracing obtained on 05/12/2023   IMAGING / PROCEDURES: MR CHEST W WO CONTRAST performed on 07/25/2023 Circumscribed mass in the superficial soft tissues of the right chest and lower axilla, consistent with a large lipoma with internal foci of fat necrosis. Although very likely benign, it is difficult to distinguish between lipoma and liposarcoma on the basis of imaging. Consider surgical referral if there is concerning clinical behavior or change.  TRANSTHORACIC ECHOCARDIOGRAM performed on 04/13/2023 Left ventricular ejection fraction, by estimation, is 60 to 65%. The left ventricle has normal function. The left ventricle has no regional wall motion abnormalities. Left ventricular diastolic parameters are indeterminate.  Right ventricular systolic function is normal. The right ventricular size is normal. Tricuspid regurgitation signal is inadequate for assessing PA pressure.  Left atrial size was moderately dilated.  The mitral valve is normal in structure. Mild mitral valve regurgitation. No evidence of mitral stenosis.  The aortic valve is tricuspid. Aortic valve regurgitation is not visualized. No aortic stenosis is present.  The inferior vena cava is normal in size with greater than 50% respiratory variability, suggesting right atrial pressure of 3 mmHg.   CT RENAL STONE STUDY performed on 02/20/2023 3 x 4  mm stone in the proximal left ureter with mild hydroureteronephrosis. Slight hyperdensity of the renal papillary regions bilaterally, which could indicate medullary sponge kidney. Cholelithiasis without evidence of cholecystitis.  MYOCARDIAL PERFUSION IMAGING STUDY (LEXISCAN)  performed on 11/29/2021 Normal left ventricular systolic function with a normal LVEF of 60-65% Normal myocardial thickening  and wall motion Left ventricular cavity size normal SPECT images demonstrate homogenous tracer distribution throughout the myocardium No evidence of stress-induced myocardial ischemia or arrhythmia Normal low risk study  CT CARDIAC SCORING performed on 10/17/2016 Coronary calcium score of 0. Ascending Aorta: 3.1 cm   Impression and Plan:  Stanley Kane has been referred for pre-anesthesia review and clearance prior to him undergoing the planned anesthetic and procedural courses. Available labs, pertinent testing, and imaging results were personally reviewed by me in preparation for upcoming operative/procedural course. [facility] medical record has been updated following extensive record review and patient interview with PAT staff.   This patient has been appropriately cleared by cardiology with an overall ACCEPTABLE risk of experiencing significant perioperative cardiovascular complications. Based on clinical review performed today (08/11/23), barring any significant acute changes in the patient's overall condition, it is anticipated that he will be able to proceed with the planned surgical intervention. Any acute changes in clinical condition may necessitate his procedure being postponed and/or cancelled. Patient will meet with anesthesia team (MD and/or CRNA) on the day of his procedure for preoperative evaluation/assessment. Questions regarding anesthetic course will be fielded at that time.   Pre-surgical instructions were reviewed with the patient during his PAT appointment, and questions were  fielded to satisfaction by PAT clinical staff. He has been instructed on which medications that he will need to hold prior to surgery, as well as the ones that have been deemed safe/appropriate to take on the day of his procedure. As part of the general education provided by PAT, patient made aware both verbally and in writing, that he would need to abstain from the use of any illegal substances during his perioperative course.  He was advised that failure to follow the provided instructions could necessitate case cancellation or result in serious perioperative complications up to and including death. Patient encouraged to contact PAT and/or his surgeon's office to discuss any questions or concerns that may arise prior to surgery; verbalized understanding.   Quentin Mulling, MSN, APRN, FNP-C, CEN [facility]  Perioperative Services Nurse Practitioner Phone: [phone] Fax: [phone] 08/11/23 9:10 AM  NOTE: This note has been prepared using Dragon dictation software. Despite my best ability to proofread, there is always the potential that unintentional transcriptional errors may still occur from this process.

## 2023-08-11 NOTE — Progress Notes (Unsigned)
Cardiac Clearance received from Dr Windell Hummingbird office. The patient may hold his Xarelto 1-2 days prior to his surgery. All notes are in Epic.

## 2023-08-14 ENCOUNTER — Ambulatory Visit: Payer: Medicaid Other | Admitting: Urgent Care

## 2023-08-14 ENCOUNTER — Ambulatory Visit
Admission: RE | Admit: 2023-08-14 | Discharge: 2023-08-14 | Disposition: A | Discharge status: Home / Self Care | Payer: Medicaid Other | Attending: General Surgery | Admitting: General Surgery

## 2023-08-14 ENCOUNTER — Other Ambulatory Visit: Payer: Self-pay

## 2023-08-14 ENCOUNTER — Encounter
Admission: RE | Disposition: A | Discharge status: Home / Self Care | Payer: Self-pay | Source: Home / Self Care | Attending: General Surgery

## 2023-08-14 ENCOUNTER — Encounter: Payer: Self-pay | Admitting: General Surgery

## 2023-08-14 DIAGNOSIS — Z87891 Personal history of nicotine dependence: Secondary | ICD-10-CM | POA: Diagnosis not present

## 2023-08-14 DIAGNOSIS — Z79620 Long term (current) use of immunosuppressive biologic: Secondary | ICD-10-CM | POA: Diagnosis not present

## 2023-08-14 DIAGNOSIS — Z87442 Personal history of urinary calculi: Secondary | ICD-10-CM | POA: Diagnosis not present

## 2023-08-14 DIAGNOSIS — R222 Localized swelling, mass and lump, trunk: Secondary | ICD-10-CM | POA: Diagnosis not present

## 2023-08-14 DIAGNOSIS — L400 Psoriasis vulgaris: Secondary | ICD-10-CM | POA: Insufficient documentation

## 2023-08-14 DIAGNOSIS — F419 Anxiety disorder, unspecified: Secondary | ICD-10-CM | POA: Diagnosis not present

## 2023-08-14 DIAGNOSIS — D171 Benign lipomatous neoplasm of skin and subcutaneous tissue of trunk: Secondary | ICD-10-CM | POA: Insufficient documentation

## 2023-08-14 DIAGNOSIS — Z7901 Long term (current) use of anticoagulants: Secondary | ICD-10-CM | POA: Diagnosis not present

## 2023-08-14 DIAGNOSIS — I4819 Other persistent atrial fibrillation: Secondary | ICD-10-CM | POA: Insufficient documentation

## 2023-08-14 DIAGNOSIS — I5022 Chronic systolic (congestive) heart failure: Secondary | ICD-10-CM | POA: Diagnosis not present

## 2023-08-14 DIAGNOSIS — F32A Depression, unspecified: Secondary | ICD-10-CM | POA: Insufficient documentation

## 2023-08-14 DIAGNOSIS — I428 Other cardiomyopathies: Secondary | ICD-10-CM | POA: Diagnosis not present

## 2023-08-14 DIAGNOSIS — I4891 Unspecified atrial fibrillation: Secondary | ICD-10-CM | POA: Diagnosis not present

## 2023-08-14 DIAGNOSIS — G4733 Obstructive sleep apnea (adult) (pediatric): Secondary | ICD-10-CM | POA: Diagnosis not present

## 2023-08-14 DIAGNOSIS — D213 Benign neoplasm of connective and other soft tissue of thorax: Secondary | ICD-10-CM | POA: Diagnosis not present

## 2023-08-14 HISTORY — DX: Long term (current) use of immunosuppressive biologic: Z79.620

## 2023-08-14 HISTORY — DX: Failed or difficult intubation, initial encounter: T88.4XXA

## 2023-08-14 HISTORY — DX: Vitamin D deficiency, unspecified: E55.9

## 2023-08-14 HISTORY — DX: Long term (current) use of anticoagulants: Z79.01

## 2023-08-14 HISTORY — DX: Calculus of gallbladder without cholecystitis without obstruction: K80.20

## 2023-08-14 HISTORY — DX: Psoriasis vulgaris: L40.0

## 2023-08-14 HISTORY — PX: LIPOMA EXCISION: SHX5283

## 2023-08-14 SURGERY — EXCISION LIPOMA
Anesthesia: General | Laterality: Right

## 2023-08-14 MED ORDER — BUPIVACAINE LIPOSOME 1.3 % IJ SUSP
INTRAMUSCULAR | Status: AC
Start: 2023-08-14 — End: ?
  Filled 2023-08-14: qty 10

## 2023-08-14 MED ORDER — BUPIVACAINE LIPOSOME 1.3 % IJ SUSP
INTRAMUSCULAR | Status: DC | PRN
  Administered 2023-08-14: 10 mL

## 2023-08-14 MED ORDER — LACTATED RINGERS IV SOLN: INTRAVENOUS | Status: DC

## 2023-08-14 MED ORDER — KETOROLAC TROMETHAMINE 30 MG/ML IJ SOLN
INTRAMUSCULAR | Status: DC | PRN
  Administered 2023-08-14: 30 mg via INTRAVENOUS

## 2023-08-14 MED ORDER — FENTANYL CITRATE (PF) 100 MCG/2ML IJ SOLN
INTRAMUSCULAR | Status: DC | PRN
  Administered 2023-08-14 (×2): 50 ug via INTRAVENOUS

## 2023-08-14 MED ORDER — MIDAZOLAM HCL 2 MG/2ML IJ SOLN
INTRAMUSCULAR | Status: DC | PRN
  Administered 2023-08-14: 2 mg via INTRAVENOUS

## 2023-08-14 MED ORDER — PROPOFOL 10 MG/ML IV BOLUS
INTRAVENOUS | Status: DC | PRN
  Administered 2023-08-14: 50 mg via INTRAVENOUS
  Administered 2023-08-14: 150 mg via INTRAVENOUS

## 2023-08-14 MED ORDER — OXYCODONE HCL 5 MG PO TABS
5.0000 mg | ORAL_TABLET | Freq: Once | ORAL | Status: AC | PRN
  Administered 2023-08-14: 5 mg via ORAL

## 2023-08-14 MED ORDER — OXYCODONE HCL 5 MG PO TABS
ORAL_TABLET | ORAL | Status: AC
  Filled 2023-08-14: qty 1

## 2023-08-14 MED ORDER — MIDAZOLAM HCL 2 MG/2ML IJ SOLN
INTRAMUSCULAR | Status: AC
  Filled 2023-08-14: qty 2

## 2023-08-14 MED ORDER — OXYCODONE HCL 5 MG/5ML PO SOLN: 5.0000 mg | Freq: Once | ORAL | Status: AC | PRN

## 2023-08-14 MED ORDER — FENTANYL CITRATE (PF) 100 MCG/2ML IJ SOLN
25.0000 ug | INTRAMUSCULAR | Status: DC | PRN
  Administered 2023-08-14 (×2): 50 ug via INTRAVENOUS

## 2023-08-14 MED ORDER — ACETAMINOPHEN 10 MG/ML IV SOLN
INTRAVENOUS | Status: DC | PRN
  Administered 2023-08-14: 1000 mg via INTRAVENOUS

## 2023-08-14 MED ORDER — ONDANSETRON HCL 4 MG/2ML IJ SOLN
INTRAMUSCULAR | Status: AC
  Filled 2023-08-14: qty 2

## 2023-08-14 MED ORDER — ROCURONIUM BROMIDE 10 MG/ML (PF) SYRINGE
PREFILLED_SYRINGE | INTRAVENOUS | Status: AC
  Filled 2023-08-14: qty 10

## 2023-08-14 MED ORDER — DEXAMETHASONE SODIUM PHOSPHATE 10 MG/ML IJ SOLN
INTRAMUSCULAR | Status: AC
  Filled 2023-08-14: qty 1

## 2023-08-14 MED ORDER — DEXAMETHASONE SODIUM PHOSPHATE 10 MG/ML IJ SOLN
INTRAMUSCULAR | Status: DC | PRN
  Administered 2023-08-14: 10 mg via INTRAVENOUS

## 2023-08-14 MED ORDER — PROPOFOL 10 MG/ML IV BOLUS
INTRAVENOUS | Status: AC
  Filled 2023-08-14: qty 40

## 2023-08-14 MED ORDER — ACETAMINOPHEN 10 MG/ML IV SOLN
INTRAVENOUS | Status: AC
  Filled 2023-08-14: qty 100

## 2023-08-14 MED ORDER — LIDOCAINE HCL (PF) 2 % IJ SOLN
INTRAMUSCULAR | Status: AC
  Filled 2023-08-14: qty 5

## 2023-08-14 MED ORDER — CEFAZOLIN SODIUM-DEXTROSE 2-4 GM/100ML-% IV SOLN
2.0000 g | INTRAVENOUS | Status: AC
  Administered 2023-08-14: 2 g via INTRAVENOUS

## 2023-08-14 MED ORDER — RIVAROXABAN 20 MG PO TABS: 20.0000 mg | ORAL_TABLET | Freq: Every day | ORAL | Status: DC

## 2023-08-14 MED ORDER — PHENYLEPHRINE 80 MCG/ML (10ML) SYRINGE FOR IV PUSH (FOR BLOOD PRESSURE SUPPORT)
PREFILLED_SYRINGE | INTRAVENOUS | Status: DC | PRN
  Administered 2023-08-14 (×4): 80 ug via INTRAVENOUS

## 2023-08-14 MED ORDER — BUPIVACAINE-EPINEPHRINE (PF) 0.5% -1:200000 IJ SOLN
INTRAMUSCULAR | Status: DC | PRN
  Administered 2023-08-14: 10 mL via PERINEURAL

## 2023-08-14 MED ORDER — FENTANYL CITRATE (PF) 100 MCG/2ML IJ SOLN
INTRAMUSCULAR | Status: AC
  Filled 2023-08-14: qty 2

## 2023-08-14 MED ORDER — CEFAZOLIN SODIUM-DEXTROSE 2-4 GM/100ML-% IV SOLN
INTRAVENOUS | Status: AC
  Filled 2023-08-14: qty 100

## 2023-08-14 MED ORDER — CHLORHEXIDINE GLUCONATE 0.12 % MT SOLN
15.0000 mL | Freq: Once | OROMUCOSAL | Status: AC
  Administered 2023-08-14: 15 mL via OROMUCOSAL

## 2023-08-14 MED ORDER — OXYCODONE HCL 5 MG PO TABS: 5.0000 mg | ORAL_TABLET | Freq: Four times a day (QID) | ORAL | 0 refills | Status: DC | PRN

## 2023-08-14 MED ORDER — PHENYLEPHRINE 80 MCG/ML (10ML) SYRINGE FOR IV PUSH (FOR BLOOD PRESSURE SUPPORT)
PREFILLED_SYRINGE | INTRAVENOUS | Status: AC
  Filled 2023-08-14: qty 10

## 2023-08-14 MED ORDER — LIDOCAINE HCL (CARDIAC) PF 100 MG/5ML IV SOSY
PREFILLED_SYRINGE | INTRAVENOUS | Status: DC | PRN
  Administered 2023-08-14: 80 mg via INTRAVENOUS

## 2023-08-14 MED ORDER — SUGAMMADEX SODIUM 200 MG/2ML IV SOLN
INTRAVENOUS | Status: DC | PRN
  Administered 2023-08-14: 300 mg via INTRAVENOUS

## 2023-08-14 MED ORDER — ONDANSETRON HCL 4 MG/2ML IJ SOLN
INTRAMUSCULAR | Status: DC | PRN
  Administered 2023-08-14: 4 mg via INTRAVENOUS

## 2023-08-14 MED ORDER — CHLORHEXIDINE GLUCONATE CLOTH 2 % EX PADS
6.0000 | MEDICATED_PAD | Freq: Once | CUTANEOUS | Status: AC
  Administered 2023-08-14: 6 via TOPICAL

## 2023-08-14 MED ORDER — ROCURONIUM BROMIDE 100 MG/10ML IV SOLN
INTRAVENOUS | Status: DC | PRN
  Administered 2023-08-14: 50 mg via INTRAVENOUS

## 2023-08-14 MED ORDER — CHLORHEXIDINE GLUCONATE 0.12 % MT SOLN
OROMUCOSAL | Status: AC
  Filled 2023-08-14: qty 15

## 2023-08-14 MED ORDER — BUPIVACAINE-EPINEPHRINE (PF) 0.5% -1:200000 IJ SOLN
INTRAMUSCULAR | Status: AC
  Filled 2023-08-14: qty 10

## 2023-08-14 MED ORDER — ORAL CARE MOUTH RINSE
15.0000 mL | Freq: Once | OROMUCOSAL | Status: AC
Start: 2023-08-14 — End: 2023-08-14

## 2023-08-14 SURGICAL SUPPLY — 26 items
BLADE SURG 15 STRL LF DISP TIS (BLADE) ×2 IMPLANT
CHLORAPREP W/TINT 26 (MISCELLANEOUS) ×2 IMPLANT
DERMABOND ADVANCED .7 DNX12 (GAUZE/BANDAGES/DRESSINGS) ×2 IMPLANT
DRAIN CHANNEL 10M FLAT 3/4 FLT (DRAIN) IMPLANT
DRAPE LAPAROTOMY 100X77 ABD (DRAPES) ×2 IMPLANT
ELECT CAUTERY BLADE 6.4 (BLADE) ×2 IMPLANT
ELECT REM PT RETURN 9FT ADLT (ELECTROSURGICAL) ×1
ELECTRODE REM PT RTRN 9FT ADLT (ELECTROSURGICAL) ×2 IMPLANT
GLOVE BIOGEL PI IND STRL 7.5 (GLOVE) ×2 IMPLANT
GLOVE SURG SYN 7.0 (GLOVE) ×1
GLOVE SURG SYN 7.0 PF PI (GLOVE) ×2 IMPLANT
GOWN STRL REUS W/ TWL LRG LVL3 (GOWN DISPOSABLE) ×4 IMPLANT
HANDLE YANKAUER SUCT BULB TIP (MISCELLANEOUS) IMPLANT
KIT TURNOVER KIT A (KITS) ×2 IMPLANT
LABEL OR SOLS (LABEL) ×2 IMPLANT
MANIFOLD NEPTUNE II (INSTRUMENTS) ×2 IMPLANT
NDL HYPO 22X1.5 SAFETY MO (MISCELLANEOUS) ×2 IMPLANT
NEEDLE HYPO 22X1.5 SAFETY MO (MISCELLANEOUS) ×1
NS IRRIG 500ML POUR BTL (IV SOLUTION) ×2 IMPLANT
PACK BASIN MINOR ARMC (MISCELLANEOUS) ×2 IMPLANT
SPONGE T-LAP 18X18 ~~LOC~~+RFID (SPONGE) IMPLANT
SUT MNCRL 4-0 27XMFL (SUTURE) ×1
SUT VIC AB 3-0 SH 27X BRD (SUTURE) ×2 IMPLANT
SUTURE MNCRL 4-0 27XMF (SUTURE) ×2 IMPLANT
TRAP FLUID SMOKE EVACUATOR (MISCELLANEOUS) ×2 IMPLANT
WATER STERILE IRR 500ML POUR (IV SOLUTION) ×2 IMPLANT

## 2023-08-14 NOTE — Anesthesia Preprocedure Evaluation (Signed)
Anesthesia Evaluation  Patient identified by MRN, date of birth, ID band Patient awake    Reviewed: Allergy & Precautions, NPO status , Patient's Chart, lab work & pertinent test results  History of Anesthesia Complications Negative for: history of anesthetic complications  Airway Mallampati: III  TM Distance: <3 FB Neck ROM: full    Dental  (+) Chipped, Poor Dentition, Missing, Loose   Pulmonary sleep apnea , former smoker   Pulmonary exam normal        Cardiovascular Exercise Tolerance: Good (-) angina (-) Past MI + dysrhythmias Atrial Fibrillation      Neuro/Psych  PSYCHIATRIC DISORDERS       Neuromuscular disease    GI/Hepatic negative GI ROS, Neg liver ROS,,,  Endo/Other  negative endocrine ROS    Renal/GU Renal disease     Musculoskeletal   Abdominal   Peds  Hematology negative hematology ROS (+)   Anesthesia Other Findings Past Medical History: No date: Anxiety No date: Chest pain     Comment:  a.) cCTA 10/17/2016: Ca2+ = 0; b.) MV 09/06/2017: EF               33-44%, no ischemia/infarct; c.) MV 01/15/2020: EF 46%,               no ischemia/infarct; d.) MV 11/29/2021: EF 60%, no               ischemia/infarct No date: Cholelithiasis No date: 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)     Comment:  a.) TTE 05/22/2012: EF 50-55%, no RWMAs, mild LAE, norm               RVSF, mild MR; b.) TTE 09/26/2017: EF 60-65%, no RWMAs,               mild LAE, norm RVSF, triv PR, mild MR/TR; c.) TTE               11/23/2017: EF 45-50%, no RWMAs, mild LAE, norm RVSF,               triv MR/PR, mild TR; d.) TTE 04/13/2023: EF 60-65%, no               RWMAs, mod LAE, norm RVSF, mild MR No date: Depression 06/2014: Fatty liver No date: History of kidney stones No date: Kidney stone 07/25/2023: Lipoma of chest wall     Comment:  a.) MRI chest 07/25/2023: 12.1 x 11.8 x 9.4 cm                circumscribed mass to RIGHT chest and lower axilla;               overlies oblique musculature and anterior latissimus               dorsi No date: Long-term current use of immunosuppressive biologic agent     Comment:  a.) secukinumab No date: NICM (nonischemic cardiomyopathy) (HCC)     Comment:  a.) EF 05/22/2012: EF 50-55%; b.) TTE 09/26/2014: EF               60-65%; c.) MV 09/06/2017: EF 33-44%; d.) TTE 11/23/2017:              EF 45-50%; e.) MV 01/15/2020: EF 45%; f.) MV 11/29/2021:               EF 60%; g.) TTE 04/13/2023: EF 60-65% No date: Obesity No date: On rivaroxaban therapy  No date: OSA (obstructive sleep apnea)     Comment:  a.) does not utilize nocturnal PAP therapy 2013: Persistent atrial fibrillation (HCC)     Comment:  a.) Dx'd in 2013; b.) CHA2DS2-VASc = 1 (HFmrEF) as of               08/09/2023; b.) s/p DCCV 07/06/2012 (150 J x1); c.) s/p               DCCV 11/25/2014 (150 J x 1); d.) cardiac rate/rhythm               maintained on oral digoxin + metoprolol succinate;               chronically anticoagulated using rivaroxaban No date: Plaque psoriasis     Comment:  a.) Tx'd with secukinumab injections + topical               triamcinolone No date: Prediabetes 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     Comment:  hospitalization [facility] - stress with father's illness No date: Vitamin D deficiency  Past Surgical History: ~2005: CARDIOVASCULAR STRESS TEST     Comment:  thinks at [place], told normal 08/2014: carotid US     Comment:  no plaque 2000: COLONOSCOPY     Comment:  WNL, for weight loss after started gym 06/01/2023: EXTRACORPOREAL SHOCK WAVE LITHOTRIPSY; Left     Comment:  Procedure: EXTRACORPOREAL SHOCK WAVE LITHOTRIPSY (ESWL);              Surgeon: Sondra Come, MD;  Location: [facility] ORS;                Service: Urology;  Laterality: Left; 08/2014: MRI     Comment:  brain - WNL, skull base C1/2 articulation childhood: TONSILLECTOMY AND  ADENOIDECTOMY 08/2014: US ECHOCARDIOGRAPHY     Comment:  EF 60%, nl vent fxn, mildly dilated LA, mild MR, mild TR  BMI    Body Mass Index: 36.64 kg/m      Reproductive/Obstetrics negative OB ROS                             Anesthesia Physical Anesthesia Plan  ASA: 3  Anesthesia Plan: General ETT   Post-op Pain Management:    Induction: Intravenous  PONV Risk Score and Plan: Ondansetron, Dexamethasone, Midazolam and Treatment may vary due to age or medical condition  Airway Management Planned: Oral ETT  Additional Equipment:   Intra-op Plan:   Post-operative Plan: Extubation in OR  Informed Consent: I have reviewed the patients History and Physical, chart, labs and discussed the procedure including the risks, benefits and alternatives for the proposed anesthesia with the patient or authorized representative who has indicated his/her understanding and acceptance.     Dental Advisory Given  Plan Discussed with: Anesthesiologist, CRNA and Surgeon  Anesthesia Plan Comments: (Patient consented for risks of anesthesia including but not limited to:  - adverse reactions to medications - damage to eyes, teeth, lips or other oral mucosa - nerve damage due to positioning  - sore throat or hoarseness - Damage to heart, brain, nerves, lungs, other parts of body or loss of life  Patient voiced understanding and assent.)       Anesthesia Quick Evaluation

## 2023-08-14 NOTE — H&P (Signed)
No changes to below H and p, proceed as planned. Last anti-coagulation dose was Friday.    08/03/2023   Stanley Kane is an 57 y.o. male.        Chief Complaint  Patient presents with   Follow-up      Right chest wall lipoma      HPI: Stanley Kane is a 57 year old with past medical history significant for atrial fibrillation on Xarelto who returns in follow-up for a right chest wall mass.  He reports that he continues to have mild symptoms from his mass.  This is worse when he lays on it he will get some pain.  He also is annoyed by the appearance of the large mass.  He has had an MRI since his last visit.  The MRI shows large likely benign lipoma with some central fat necrosis  He denies any shortness of breath and says that he can walk up a flight of stairs without getting short of breath.       Past Medical History:  Diagnosis Date   Anxiety     Chest pain      a. 09/2016 Cor Ca2+ scoring: 0; b. 08/2017 MV: No isch/infarct. EF 33-44%; c. 12/2019 MV: No isch/infarct. EF 46%; d. 11/2021 MV: No isch/infarct. Nl LVEF. No significant Ao/Cor Ca2+.   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 09/2014 Echo: EF 60-65%, mild MR/TR; b. 10/2017 Echo: EF 45-50%, no rwma, mildly dil LA, nl RV fxn.   Obesity     OSA (obstructive sleep apnea)     Persistent atrial fibrillation (HCC)      a. dx 2013 s/p DCCV; b. recurrent Afib 2016 s/p DCCV 11/25/2014; c. 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, told normal   carotid US   08/2014    no plaque   COLONOSCOPY   2000    WNL, for weight loss after started gym   EXTRACORPOREAL  SHOCK WAVE LITHOTRIPSY Left 06/01/2023    Procedure: EXTRACORPOREAL SHOCK WAVE LITHOTRIPSY (ESWL);  Surgeon: Sondra Come, MD;  Location: [facility] ORS;  Service: Urology;  Laterality: Left;   MRI   08/2014    brain - WNL, skull base C1/2 articulation   TONSILLECTOMY AND ADENOIDECTOMY   childhood   US ECHOCARDIOGRAPHY   08/2014    EF 60%, nl vent fxn, mildly dilated LA, mild MR, mild TR               Family History  Problem Relation Age of Onset   Hypertension Father     Coronary artery disease Father 61   Heart attack Father     Cirrhosis Father     Hypercalcemia Father     Diabetes Mother     Heart disease Mother     CAD Sister 43        deceased from MI   GI Disease Sister     Stroke Neg Hx  Social History:  reports that he quit smoking about 26 years ago. His smoking use included cigarettes. He has been exposed to tobacco smoke. He has never used smokeless tobacco. He reports that he does not currently use alcohol after a past usage of about 2.0 standard drinks of alcohol per week. He reports current drug use. Drug: Cocaine.   Allergies:  Allergies       Allergies  Allergen Reactions   Vicodin [Hydrocodone-Acetaminophen]        Makes him "jittery"        Medications reviewed.       ROS Full ROS performed and is otherwise negative other than what is stated in HPI     BP 109/66   Pulse 86   Temp 98 F (36.7 C) (Oral)   Ht 5\' 8"  (1.727 m)   Wt 241 lb (109.3 kg)   SpO2 95%   BMI 36.64 kg/m    Physical Exam Alert and oriented x 3, clear to auscultation bilaterally, regular rate on exam today.  On his right chest wall he has a large soft mobile mass without overlying skin changes.     I have reviewed his MRI.  There is a large fat appearing mass with some central necrosis on his right chest wall.  There are no internal septations or irregularity that would be concerning for malignant process.     Assessment/Plan: 57 year old male with large right  chest wall that on physical exam and imaging seems to be a lipoma.  He does have a history of atrial fibrillation and is on anticoagulation.  Given the size of the mass and his medical comorbidities I think that the best option for him is to go to the operating room for removal.  I have discussed the risk, benefits alternatives of the procedure including risk of infection, bleeding, malignant process, recurrence of the mass.  I have also discussed with him that there is a high likelihood that he will have a drain placed into the empty space after the resection of the mass.  We will ask his cardiologist for cardiac restratification.  We will hold his Xarelto 48 hours prior to the procedure.   Baker Pierini, M.D. [facility]

## 2023-08-14 NOTE — Anesthesia Procedure Notes (Signed)
Procedure Name: Intubation Date/Time: 08/14/2023 8:45 AM  Performed by: Elisabeth Pigeon, CRNAPre-anesthesia Checklist: Patient identified, Patient being monitored, Timeout performed, Emergency Drugs available and Suction available Patient Re-evaluated:Patient Re-evaluated prior to induction Oxygen Delivery Method: Circle system utilized Preoxygenation: Pre-oxygenation with 100% oxygen Induction Type: IV induction Ventilation: Mask ventilation without difficulty Laryngoscope Size: Mac and 4 Grade View: Grade III Tube type: Oral Tube size: 7.5 mm Number of attempts: 3 Airway Equipment and Method: Stylet and Bougie stylet (Steerable stylet used) Placement Confirmation: ETT inserted through vocal cords under direct vision, positive ETCO2 and breath sounds checked- equal and bilateral Secured at: 23 cm Tube secured with: Tape Dental Injury: Teeth and Oropharynx as per pre-operative assessment

## 2023-08-14 NOTE — Op Note (Signed)
Operative note Preoperative diagnosis: Right chest wall mass Postoperative diagnosis: Same Procedure: Excision of right chest wall mass Surgeon: Baker Pierini, MD EBL: 20 cc  After informed consent was obtained the patient was brought to the operating room placed supine on the operating room table.  General endotracheal anesthesia was induced.  He was then placed in the left lateral decubitus position with right side up.  His right chest wall was then prepped and draped in the usual sterile fashion.  A surgical timeout was called identifying correct patient, site, side and procedure.  A incision was made over the mass.  This was taken down through the subcutaneous tissue with Bovie cautery.  The mass was quite large and did have some lobulations to it.  The mass was separated from the surrounding subcutaneous tissue with Bovie cautery and dissected off bluntly as well.  The mass did extend down into the fascia of the muscle.  It was cleared from all surrounding tissue and excised fully.  The mass measured approximately 17 cm x 12-1/2 cm.  The cavity was then copiously irrigated with warm saline solution.  Hemostasis was then obtained.  The fascia overlying the muscle was then closed with a 3-0 Vicryl.  A 10 French flat JP drain was then placed into the cavity and brought out on the anterior chest.  There is a drain was then secured with a 2-0 nylon suture.  The deep subcutaneous tissue was then closed over the drain using 3-0 Vicryl suture.  The deep dermal layer was then closed with 3-0 Vicryl.  The skin was then closed with 4-0 Monocryl.  Prior to closure of the skin we did infiltrate the subcutaneous tissue with Marcaine and liposomal bupivacaine solution.  The incision was then dressed with glue.  A drain sponge was placed around the drain.  The patient was then placed supine back on the operating table and awoken from general endotracheal anesthesia.  Prior to termination of the procedure all sponge and  instrument counts were correct x 2

## 2023-08-14 NOTE — Transfer of Care (Signed)
Immediate Anesthesia Transfer of Care Note  Patient: Stanley Kane  Procedure(s) Performed: EXCISION LIPOMA, chest wall (Right)  Patient Location: PACU  Anesthesia Type:General  Level of Consciousness: awake  Airway & Oxygen Therapy: Patient Spontanous Breathing and Patient connected to face mask oxygen  Post-op Assessment: Report given to RN and Post -op Vital signs reviewed and stable  Post vital signs: Reviewed and stable  Last Vitals:  Vitals Value Taken Time  BP 114/82 08/14/23 0921  Temp 36 C 08/14/23 0921  Pulse 86 08/14/23 0923  Resp 26 08/14/23 0923  SpO2 100 % 08/14/23 0923  Vitals shown include unfiled device data.  Last Pain:  Vitals:   08/14/23 0616  TempSrc: Temporal  PainSc: 0-No pain      Patients Stated Pain Goal: 0 (08/14/23 0616)  Complications: No notable events documented.

## 2023-08-15 ENCOUNTER — Telehealth: Payer: Self-pay | Admitting: General Surgery

## 2023-08-15 LAB — SURGICAL PATHOLOGY

## 2023-08-15 NOTE — Telephone Encounter (Signed)
Spoke with the pharmacy and they said that there was not any need for approval. They said that his medication will be ready today by 3 pm. Patient has been notified of this.

## 2023-08-15 NOTE — Anesthesia Postprocedure Evaluation (Signed)
Anesthesia Post Note  Patient: Stanley Kane  Procedure(s) Performed: EXCISION LIPOMA, chest wall (Right)  Patient location during evaluation: PACU Anesthesia Type: General Level of consciousness: awake and alert Pain management: pain level controlled Vital Signs Assessment: post-procedure vital signs reviewed and stable Respiratory status: spontaneous breathing, nonlabored ventilation, respiratory function stable and patient connected to nasal cannula oxygen Cardiovascular status: blood pressure returned to baseline and stable Postop Assessment: no apparent nausea or vomiting Anesthetic complications: no   No notable events documented.   Last Vitals:  Vitals:   08/14/23 1011 08/14/23 1149  BP: 113/69 (P) 130/67  Pulse: 89   Resp: 18 (P) 18  Temp: 36.7 C   SpO2: 96% (P) 96%    Last Pain:  Vitals:   08/15/23 1003  TempSrc:   PainSc: 0-No pain                 Cleda Mccreedy Mylia Pondexter

## 2023-08-15 NOTE — Telephone Encounter (Signed)
Patient had excision lipoma chest wall done on 08/14/23 wit Dr. Maurine Minister.  Patient states that his pharmacy needs approval for his Oxycodone.  Please call patient back with status. Thank you.

## 2023-08-17 ENCOUNTER — Telehealth: Payer: Self-pay | Admitting: Cardiovascular Disease

## 2023-08-17 NOTE — Telephone Encounter (Signed)
Per answering service message:  Applying for disability, needs to get in contact with provider to get information for case worker

## 2023-08-17 NOTE — Telephone Encounter (Signed)
Spoke with pt who stated he he is applying for disability and need the name of the provider he saw at our [place] location. Per chart review, on 05/08/23, pt saw Reather Littler, NP. Pt made aware and verbalized understanding.

## 2023-08-24 ENCOUNTER — Ambulatory Visit: Payer: Medicaid Other | Admitting: General Surgery

## 2023-08-24 ENCOUNTER — Encounter: Payer: Self-pay | Admitting: General Surgery

## 2023-08-24 VITALS — BP 105/73 | HR 108 | Temp 98.0°F | Ht 68.0 in | Wt 238.6 lb

## 2023-08-24 DIAGNOSIS — D171 Benign lipomatous neoplasm of skin and subcutaneous tissue of trunk: Secondary | ICD-10-CM

## 2023-08-24 NOTE — Progress Notes (Signed)
Outpatient Surgical Follow Up  08/24/2023  Stanley Kane is an 57 y.o. male.   Chief Complaint  Patient presents with   Routine Post Op    Excision lipoma 08/14/23    HPI: Patient returns today status post excision of giant lipoma.  He reports doing well.  He says his pain is well-controlled.  He did have a drain placed and the drain is putting putting out about 10 to 15 cc of serous output per day.  He denies any drainage from the wound.  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)    a.) TTE 05/22/2012: EF 50-55%, no RWMAs, mild LAE, norm RVSF, mild MR; b.) TTE 09/26/2017: EF 60-65%, no RWMAs, mild LAE, norm RVSF, triv PR, mild MR/TR; c.) TTE 11/23/2017: EF 45-50%, no RWMAs, mild LAE, norm RVSF, triv MR/PR, mild TR; d.) TTE 04/13/2023: EF 60-65%, no RWMAs, mod LAE, norm RVSF, mild MR   Depression    Difficult intubation    Fatty liver 06/2014   History of kidney stones    Kidney stone    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   a.) MRI chest 07/25/2023: 12.1 x 11.8 x 9.4 cm circumscribed mass to RIGHT chest and lower axilla; overlies oblique musculature and anterior latissimus dorsi   Long-term current use of immunosuppressive biologic agent    a.) secukinumab   NICM (nonischemic cardiomyopathy) (HCC)    a.) EF 05/22/2012: EF 50-55%; b.) TTE 09/26/2014: EF 60-65%; c.) MV 09/06/2017: EF 33-44%; d.) TTE 11/23/2017: EF 45-50%; e.) MV 01/15/2020: EF 45%; f.) MV 11/29/2021: EF 60%; g.) TTE 04/13/2023: EF 60-65%   Obesity    On rivaroxaban therapy    OSA (obstructive sleep apnea)    a.) does not utilize nocturnal PAP therapy   Persistent atrial fibrillation (HCC) 2013    a.) Dx'd in 2013; b.) CHA2DS2-VASc = 1 (HFmrEF) as of 08/09/2023; b.) s/p DCCV 07/06/2012 (150 J x1); c.) s/p DCCV 11/25/2014 (150 J x 1); d.) cardiac rate/rhythm maintained on oral digoxin + metoprolol succinate; chronically anticoagulated using rivaroxaban   Plaque psoriasis    a.) Tx'd with secukinumab injections + topical triamcinolone   Prediabetes    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   hospitalization [facility] - stress with father's illness   Vitamin D deficiency     Past Surgical History:  Procedure Laterality Date   CARDIOVASCULAR STRESS TEST  ~2005   thinks at [facility], told normal   carotid US  08/2014   no plaque   COLONOSCOPY  2000   WNL, for weight loss after started gym   EXTRACORPOREAL SHOCK WAVE LITHOTRIPSY Left 06/01/2023   Procedure: EXTRACORPOREAL SHOCK WAVE LITHOTRIPSY (ESWL);  Surgeon: Sondra Come, MD;  Location: [facility] ORS;  Service: Urology;  Laterality: Left;   LIPOMA EXCISION Right 08/14/2023   Procedure: EXCISION LIPOMA, chest wall;  Surgeon: Kandis Cocking, MD;  Location: [facility] ORS;  Service: General;  Laterality: Right;   MRI  08/2014   brain - WNL, skull base C1/2 articulation   TONSILLECTOMY AND ADENOIDECTOMY  childhood   US ECHOCARDIOGRAPHY  08/2014   EF 60%, nl vent fxn, mildly dilated LA, mild MR, mild  TR    Family History  Problem Relation Age of Onset   Hypertension Father    Coronary artery disease Father 57   Heart attack Father    Cirrhosis Father    Hypercalcemia Father    Diabetes Mother    Heart disease Mother    CAD Sister 15       deceased from MI   GI Disease Sister    Stroke Neg Hx     Social History:  reports that he quit smoking about 27 years ago. His smoking use included cigarettes. He has been exposed to tobacco smoke. He has never used smokeless tobacco. He reports that he does not currently use alcohol after a past usage of about 2.0 standard drinks of alcohol per week. He reports current drug use. Drug: Cocaine.  Allergies:   Allergies  Allergen Reactions   Vicodin [Hydrocodone-Acetaminophen]     Makes him "jittery"    Medications reviewed.    ROS Full ROS performed and is otherwise negative other than what is stated in HPI   BP 105/73   Pulse (!) 108   Temp 98 F (36.7 C) (Oral)   Ht 5\' 8"  (1.727 m)   Wt 238 lb 9.6 oz (108.2 kg)   SpO2 95%   BMI 36.28 kg/m   Physical Exam Right chest wall incision is healing well.  There is minimal bruising around it and there is no erythema.  The surgical glue still is over the incision.  There is some induration tissue underneath the incision but no evidence of recurrence of the lipoma.  His drain is serous in nature with scant output in the bulb.   His pathology is consistent with benign lipoma with some fat necrosis in the central lipoma. Assessment/Plan:  Mr. Bibb is status post excision of giant lipoma.  He is doing well and the drain was removed today without incident.  I discussed with him that he can submerge the wounds now.  He should continue to use caution when lifting his hands overhead.  Continue lifting restrictions no greater than 15 pounds for another 2 weeks.  He can follow-up with our office on an as-needed basis   Baker Pierini, M.D. [facility]

## 2023-08-24 NOTE — Patient Instructions (Signed)
Lipoma Removal, Care After The following information offers guidance on how to care for yourself after your procedure. Your health care provider may also give you more specific instructions. If you have problems or questions, contact your health care provider. What can I expect after the procedure? After the procedure, it is common to have: Mild pain. Swelling. Bruising. Follow these instructions at home: Bathing  Do not take baths, swim, or use a hot tub until your health care provider approves. Ask your health care provider if you may take showers. You may only be allowed to take sponge baths. Keep your bandage (dressing) clean and dry until your health care provider says it can be removed. Incision care  Follow instructions from your health care provider about how to take care of your incision. Make sure you: Wash your hands with soap and water for at least 20 seconds before and after you change your dressing. If soap and water are not available, use hand sanitizer. Change your dressing as told by your health care provider. Leave stitches (sutures), skin glue, or adhesive strips in place. These skin closures may need to stay in place for 2 weeks or longer. If adhesive strip edges start to loosen and curl up, you may trim the loose edges. Do not remove adhesive strips completely unless your health care provider tells you to do that. Check your incision area every day for signs of infection. Check for: More redness, swelling, or pain. Fluid or blood. Warmth. Pus or a bad smell. Medicines Take over-the-counter and prescription medicines only as told by your health care provider. If you were prescribed an antibiotic medicine, use it as told by your health care provider. Do not stop using the antibiotic even if you start to feel better. General instructions  If you were given a sedative during the procedure, it can affect you for several hours. Do not drive or operate machinery until your  health care provider says that it is safe. Do not use any products that contain nicotine or tobacco before the procedure. These products include cigarettes, chewing tobacco, and vaping devices, such as e-cigarettes. These can delay healing after surgery. If you need help quitting, ask your health care provider. Return to your normal activities as told by your health care provider. Ask your health care provider what activities are safe for you. Keep all follow-up visits. This is important. Contact a health care provider if: You have more redness, swelling, or pain around your incision. You have fluid or blood coming from your incision. Your incision feels warm to the touch. You have pus or a bad smell coming from your incision. You have pain that does not get better with medicine. Get help right away if: You have chills or a fever. You have severe pain. Summary After the procedure, it is common to have mild pain, swelling, and bruising. Follow instructions from your health care provider about how to take care of your incision. Contact a health care provider if you have signs of infection such as more redness, swelling, or pain. This information is not intended to replace advice given to you by your health care provider. Make sure you discuss any questions you have with your health care provider. Document Revised: 09/03/2021 Document Reviewed: 09/03/2021 Elsevier Patient Education  2024 [employer].

## 2023-10-02 ENCOUNTER — Ambulatory Visit: Payer: Self-pay | Admitting: Family Medicine

## 2023-10-02 NOTE — Telephone Encounter (Signed)
Chief Complaint: SOB, chest pain, cough Symptoms: moderate SOB, chest pain/heaviness after coughing spells, productive cough, wheezing Frequency: started 2-3 weeks ago; worsening x 1 week, comes and goes Pertinent Negatives: Patient denies fever Disposition: [x] ED /[] Urgent Care (no appt availability in office) / [] Appointment(In office/virtual)/ []  [facility]/ [] Home Care/ [] Refused Recommended Disposition /[] [place] [facility]/ []  Follow-up with PCP Additional Notes: Patient states it started as sinus congestion and mild cough about 2-3 weeks ago. Patient states over the last week his cough has worsened with SOB and chest pain. Patient was exposed to influenza about 2 weeks ago. Patient states his SOB and chest heaviness have worsened as the cough has also worsened. Advised ED, patient agreeable and state he will go to [facility]. Copied from CRM [id number]. Topic: Clinical - Red Word Triage >> Oct 02, 2023  1:33 PM Larwance Sachs wrote: Red Word that prompted transfer to Nurse Triage: Patient called in regarding having flu like symptoms over last 5 days, patient states having progressive bad cough, sinus trouble, also chest problems like difficulty breathing and advised of having AFIB. Reason for Disposition  [1] MODERATE difficulty breathing (e.g., speaks in phrases, SOB even at rest, pulse 100-120) AND [2] still present when not coughing  Answer Assessment - Initial Assessment Questions 1. ONSET: "When did the cough begin?"       "About a week or so ago"  2. SEVERITY: "How bad is the cough today?"      Patient states cough is a 4-5/10 and it comes and goes. Worst at night.  3. SPUTUM: "Describe the color of your sputum" (none, dry cough; clear, white, yellow, green)     Sometimes productive and states at one point it was yellow.  4. HEMOPTYSIS: "Are you coughing up any blood?" If so ask: "How much?" (flecks, streaks, tablespoons, etc.)     Denies.  5. DIFFICULTY  BREATHING: "Are you having difficulty breathing?" If Yes, ask: "How bad is it?" (e.g., mild, moderate, severe)    - MILD: No SOB at rest, mild SOB with walking, speaks normally in sentences, can lie down, no retractions, pulse < 100.    - MODERATE: SOB at rest, SOB with minimal exertion and prefers to sit, cannot lie down flat, speaks in phrases, mild retractions, audible wheezing, pulse 100-120.    - SEVERE: Very SOB at rest, speaks in single words, struggling to breathe, sitting hunched forward, retractions, pulse > 120      Comes and goes; moderate.  6. FEVER: "Do you have a fever?" If Yes, ask: "What is your temperature, how was it measured, and when did it start?"     Denies.  7. CARDIAC HISTORY: "Do you have any history of heart disease?" (e.g., heart attack, congestive heart failure)      Afib.  8. LUNG HISTORY: "Do you have any history of lung disease?"  (e.g., pulmonary embolus, asthma, emphysema)     Denies.  9. PE RISK FACTORS: "Do you have a history of blood clots?" (or: recent major surgery, recent prolonged travel, bedridden)     Denies.  10. OTHER SYMPTOMS: "Do you have any other symptoms?" (e.g., runny nose, wheezing, chest pain)       Wheezing, chest heaviness  when he has a "coughing spell"  11. TRAVEL: "Have you traveled out of the country in the last month?" (e.g., travel history, exposures)       Known flu exposure 2 -3 weeks ago. Denies travel.  Protocols used: Cough -  Acute Productive-A-AH

## 2023-10-03 ENCOUNTER — Encounter: Payer: Self-pay | Admitting: Family Medicine

## 2023-10-03 ENCOUNTER — Ambulatory Visit (INDEPENDENT_AMBULATORY_CARE_PROVIDER_SITE_OTHER)
Admission: RE | Admit: 2023-10-03 | Discharge: 2023-10-03 | Disposition: A | Discharge status: Home / Self Care | Payer: Medicaid Other | Source: Ambulatory Visit | Attending: Family Medicine | Admitting: Family Medicine

## 2023-10-03 ENCOUNTER — Ambulatory Visit (INDEPENDENT_AMBULATORY_CARE_PROVIDER_SITE_OTHER): Payer: Medicaid Other | Admitting: Family Medicine

## 2023-10-03 VITALS — BP 124/86 | HR 99 | Temp 97.8°F | Ht 68.0 in | Wt 245.4 lb

## 2023-10-03 DIAGNOSIS — R052 Subacute cough: Secondary | ICD-10-CM

## 2023-10-03 DIAGNOSIS — J22 Unspecified acute lower respiratory infection: Secondary | ICD-10-CM | POA: Insufficient documentation

## 2023-10-03 DIAGNOSIS — R059 Cough, unspecified: Secondary | ICD-10-CM | POA: Diagnosis not present

## 2023-10-03 MED ORDER — DOXYCYCLINE HYCLATE 100 MG PO TABS: 100.0000 mg | ORAL_TABLET | Freq: Two times a day (BID) | ORAL | 0 refills | Status: DC

## 2023-10-03 MED ORDER — BENZONATATE 100 MG PO CAPS: 100.0000 mg | ORAL_CAPSULE | Freq: Three times a day (TID) | ORAL | 0 refills | Status: DC | PRN

## 2023-10-03 NOTE — Telephone Encounter (Signed)
 Left message to return call to our office.

## 2023-10-03 NOTE — Patient Instructions (Addendum)
Xray today  RSV swab today  Take tessalon perls as needed for cough - swallow don't chew.  Take antibiotic doxycycline twice daily for 7 days.  Let us know if not improving with treatment.  Good to see you today.

## 2023-10-03 NOTE — Telephone Encounter (Signed)
I don't see where he was evaluated for this.  Can we call for update - would offer OV with next available provider or go to [facility].

## 2023-10-03 NOTE — Progress Notes (Signed)
 Ph: [phone] Fax: [phone]   Patient ID: Stanley Kane, male    DOB: Oct 17, 1965, 58 y.o.   MRN: [id number]  This visit was conducted in person.  BP 124/86   Pulse 99   Temp 97.8 F (36.6 C) (Oral)   Ht 5' 8 (1.727 m)   Wt 245 lb 6 oz (111.3 kg)   SpO2 98%   BMI 37.31 kg/m    CC: 3 wks of congestion cough Subjective:   HPI: Stanley Kane is a 59 y.o. male presenting on 10/03/2023 for Cough (C/o cough and nasal/congestion. Sxs 2-3 wks ago. Exposed to flu about 3 wks ago. Tried Dayquil/Nyquil, helpful. )   Exposed to the flu ~3 wks ago. After this he started developing significant clear rhinorrhea, with subsequent development of chest congestion, wheezing, dyspnea and rattly cough but nonproductive over the past 2 weeks. Low grade fever and chills, malaise, anorexia, HA. Cough is keeping him up at night.   No body aches, ST, ear or tooth pain, abd pain, nausea, diarrhea.    No known h/o asthma.  Treating symptoms with dayquil, nyquil and vicks cough syrup.  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      Relevant past medical, surgical, family and social history reviewed and updated as indicated. Interim medical history since our last visit reviewed. Allergies and medications reviewed and updated. Outpatient Medications Prior to Visit  Medication Sig Dispense Refill   DENTA 5000 PLUS 1.1 % CREA dental cream Place 1 Application onto teeth daily.     digoxin  (LANOXIN ) 0.125 MG tablet Take 1 tablet (0.125 mg total) by mouth daily. 90 tablet 3   escitalopram  (LEXAPRO ) 20 MG tablet Take 1 tablet (20 mg total) by mouth daily. 30 tablet 4   furosemide  (LASIX ) 20 MG tablet Take 1 tablet (20 mg total) by mouth as needed for edema. 90 tablet 3   hydroxypropyl methylcellulose / hypromellose (ISOPTO TEARS / GONIOVISC) 2.5 % ophthalmic solution Place 1 drop into both eyes as needed for dry eyes.      metoprolol  succinate (TOPROL -XL) 25 MG 24 hr tablet Take 1 tablet (25 mg total) by mouth in the morning and at bedtime. 180 tablet 3   rivaroxaban  (XARELTO ) 20 MG TABS tablet Take 1 tablet (20 mg total) by mouth daily with supper.     Secukinumab, 300 MG Dose, (COSENTYX, 300 MG DOSE,) 150 MG/ML SOSY Inject 2 mLs (300 mg total) into the skin every 30 (thirty) days.     triamcinolone  cream (KENALOG ) 0.1 % APPLY TO AFFECTED AREA TWICE A DAY AS DIRECTED 80 g 0   Vitamin D , Ergocalciferol , 50000 units CAPS Take 5,000 Units by mouth once a week.     No facility-administered medications prior to visit.     Per HPI unless specifically indicated in ROS section below Review of Systems  Objective:  BP 124/86   Pulse 99   Temp 97.8 F (36.6 C) (Oral)   Ht 5' 8 (1.727 m)   Wt 245 lb 6 oz (111.3 kg)   SpO2 98%   BMI 37.31 kg/m   Wt Readings from Last 3 Encounters:  10/03/23 245 lb 6 oz (111.3 kg)  08/24/23 238 lb 9.6 oz (108.2 kg)  08/14/23 241 lb (109.3 kg)      Physical Exam Vitals and nursing note reviewed.  Constitutional:      Appearance: Normal appearance. He is  not ill-appearing.  HENT:     Head: Normocephalic and atraumatic.     Right Ear: Hearing, ear canal and external ear normal. There is impacted cerumen.     Left Ear: Hearing, ear canal and external ear normal. There is impacted cerumen.     Nose: Rhinorrhea present. No congestion.     Right Sinus: No maxillary sinus tenderness or frontal sinus tenderness.     Left Sinus: No maxillary sinus tenderness or frontal sinus tenderness.     Comments: Wearing mask    Mouth/Throat:     Mouth: Mucous membranes are moist.     Pharynx: Oropharynx is clear. Posterior oropharyngeal erythema present. No oropharyngeal exudate.  Eyes:     Extraocular Movements: Extraocular movements intact.     Conjunctiva/sclera: Conjunctivae normal.     Pupils: Pupils are equal, round, and reactive to light.  Cardiovascular:     Rate and Rhythm:  Normal rate. Rhythm irregular.     Pulses: Normal pulses.     Heart sounds: Normal heart sounds. No murmur heard. Pulmonary:     Effort: Pulmonary effort is normal. No respiratory distress.     Breath sounds: Normal breath sounds. No wheezing, rhonchi or rales.     Comments:  Crackles diffusely throughout Musculoskeletal:     Cervical back: Normal range of motion and neck supple. No rigidity.     Right lower leg: No edema.     Left lower leg: No edema.  Lymphadenopathy:     Cervical: No cervical adenopathy.  Skin:    General: Skin is warm and dry.     Findings: No rash.  Neurological:     Mental Status: He is alert.  Psychiatric:        Mood and Affect: Mood normal.        Behavior: Behavior normal.         Assessment & Plan:   Problem List Items Addressed This Visit     Acute lower respiratory infection - Primary   Ongoing for 3 wks with predominant cough, chest congestion after flu exposure.  Did have significant mucous production initially.  Check RSV.  ?atypical infection - check CXR today - no obvious consolidation on my read. Await radiology interpretation Given duration of illness, will cover for atypical infection with doxycycline  abx 7d course.  Rx tessalon  perls for cough.  Supportive measures reviewed.  Update if not improving with treatment.       Other Visit Diagnoses       Subacute cough       Relevant Orders   DG Chest 2 View   RSV screen (nasopharyngeal)not at [facility]        Meds ordered this encounter  Medications   benzonatate  (TESSALON ) 100 MG capsule    Sig: Take 1 capsule (100 mg total) by mouth 3 (three) times daily as needed for cough.    Dispense:  30 capsule    Refill:  0   doxycycline  (VIBRA -TABS) 100 MG tablet    Sig: Take 1 tablet (100 mg total) by mouth 2 (two) times daily.    Dispense:  14 tablet    Refill:  0    Orders Placed This Encounter  Procedures   RSV screen (nasopharyngeal)not at [facility]   DG Chest 2 View     Standing Status:   Future    Number of Occurrences:   1    Expiration Date:   10/02/2024    Reason for Exam (SYMPTOM  OR  DIAGNOSIS REQUIRED):   cough x 3 wks after initial flu exposure    Preferred imaging location?:   [facility]    Patient Instructions  Xray today  RSV swab today  Take tessalon  perls as needed for cough - swallow don't chew.  Take antibiotic doxycycline  twice daily for 7 days.  Let us  know if not improving with treatment.  Good to see you today.   Follow up plan: Return if symptoms worsen or fail to improve.  Anton Blas, MD

## 2023-10-03 NOTE — Telephone Encounter (Signed)
Patient called back feeling a little better but was able to set up for today at 230.

## 2023-10-03 NOTE — Assessment & Plan Note (Addendum)
 Ongoing for 3 wks with predominant cough, chest congestion after flu exposure.  Did have significant mucous production initially.  Check RSV.  ?atypical infection - check CXR today - no obvious consolidation on my read. Await radiology interpretation Given duration of illness, will cover for atypical infection with doxycycline  abx 7d course.  Rx tessalon  perls for cough.  Supportive measures reviewed.  Update if not improving with treatment.

## 2023-10-04 LAB — RSV SCREEN (NASOPHARYNGEAL) NOT AT ARMC
MICRO NUMBER:: 16039178
RESULT:: NOT DETECTED
SPECIMEN QUALITY:: ADEQUATE

## 2023-10-10 ENCOUNTER — Ambulatory Visit: Payer: Self-pay | Admitting: Family Medicine

## 2023-10-10 ENCOUNTER — Encounter: Payer: Self-pay | Admitting: Family Medicine

## 2023-10-10 ENCOUNTER — Other Ambulatory Visit: Payer: Self-pay | Admitting: Family Medicine

## 2023-10-10 ENCOUNTER — Telehealth: Payer: Self-pay | Admitting: Family Medicine

## 2023-10-10 MED ORDER — AMOXICILLIN-POT CLAVULANATE 875-125 MG PO TABS
1.0000 | ORAL_TABLET | Freq: Two times a day (BID) | ORAL | 0 refills | Status: AC
Start: 2023-10-10 — End: 2023-10-20

## 2023-10-10 NOTE — Telephone Encounter (Signed)
Prescription Request  10/10/2023  LOV: 10/03/2023    Pt stated that he needs a refill on his meds soon as possible .   What is the name of the medication or equipment? benzonatate (TESSALON) 100 MG capsule  doxycycline (VIBRA-TABS) 100 MG tablet   Have you contacted your pharmacy to request a refill? Yes   Which pharmacy would you like this sent to?  [id number] [place], [place] - [address] AT [facility] [address] [place] [address] Phone: [phone] Fax: [phone]    Patient notified that their request is being sent to the clinical staff for review and that they should receive a response within 2 business days.   Please advise at Mobile [phone] (mobile)

## 2023-10-10 NOTE — Telephone Encounter (Signed)
Also, see today's Nurse Triage note.

## 2023-10-10 NOTE — Telephone Encounter (Signed)
Copied from CRM [id number]. Topic: Clinical - Medication Question >> Oct 10, 2023  9:12 AM Fonda Kinder J wrote: Reason for CRM: Pt was prescribed doxycycline & benzonatate for Acute lower respiratory infection, but his symptoms are not improving. He wants to know if he can get a refill or if he needs to come back in   Chief Complaint: Infection on Antibiotic Follow Up Symptoms: Cough, Congestion Frequency: Ongoing Pertinent Negatives: Patient denies chest pain, or dyspnea, or fever.  Disposition: [] ED /[] Urgent Care (no appt availability in office) / [] Appointment(In office/virtual)/ []  [facility]/ [] Home Care/ [] Refused Recommended Disposition /[] [place] [facility]/ []  Follow-up with PCP Additional Notes: Patient contacted via phone regarding concerns of persistent symptoms on antibiotics. Discussed symptoms, severity, and duration. Based on assessment, patient was advised to see PCP or speak with PCP Office. Contacted CAL for further guidance regarding either a refill or an appointment. Relayed information from CAL to patient. Patient verbalized understanding and agreement with plan. Documentation provided.      Reason for Disposition  [1] Taking antibiotic > 72 hours (3 days) AND [2] symptoms (other than fever) not improved  Answer Assessment - Initial Assessment Questions 1. INFECTION: "What infection is the antibiotic being given for?"     Respiratory Infection  2. ANTIBIOTIC: "What antibiotic are you taking" "How many times per day?"     Doxycycline BID   3. DURATION: "When was the antibiotic started?"     6 Days   4. MAIN CONCERN OR SYMPTOM:  "What is your main concern right now?"      Cough, Sinus Congestion  5. BETTER-SAME-WORSE: "Are you getting better, staying the same, or getting worse compared to when you first started the antibiotics?" If getting worse, ask: "In what way?"      Same  6. FEVER: "Do you have a fever?" If Yes, ask: "What is your temperature, how  was it measured, and when did it start?"     No  7. SYMPTOMS: "Are there any other symptoms you're concerned about?" If Yes, ask: "When did it start?"     Cough  8. FOLLOW-UP APPOINTMENT: "Do you have a follow-up appointment with your doctor?"     No  Protocols used: Infection on Antibiotic Follow-up Call-A-AH

## 2023-10-10 NOTE — Telephone Encounter (Signed)
See 10/10/23 phone note sent to Dr Reece Agar. Delaney Meigs at front desk spoke with Misty Stanley CMA and advised to send to Dr Reece Agar.

## 2023-10-10 NOTE — Telephone Encounter (Signed)
Treated for lower respiratory infection with 7d doxy course.  Chest x-ray results still pending. Given no significant improvement on doxycycline antibiotic, I would like him to start an Augmentin antibiotic which I sent to his pharmacy. If ongoing symptoms despite this, recommend he return for follow-up office visit.

## 2023-10-10 NOTE — Telephone Encounter (Signed)
error 

## 2023-10-10 NOTE — Telephone Encounter (Signed)
Left message on vm, per dpr, relaying Dr Timoteo Expose message.

## 2023-10-11 MED ORDER — BENZONATATE 100 MG PO CAPS: 100.0000 mg | ORAL_CAPSULE | Freq: Three times a day (TID) | ORAL | 0 refills | Status: DC | PRN

## 2023-10-12 NOTE — Telephone Encounter (Signed)
error 

## 2023-10-19 ENCOUNTER — Encounter: Payer: Self-pay | Admitting: Family Medicine

## 2023-10-25 ENCOUNTER — Ambulatory Visit: Payer: Medicaid Other | Admitting: Family Medicine

## 2023-10-30 ENCOUNTER — Telehealth: Payer: Self-pay | Admitting: Cardiovascular Disease

## 2023-10-30 NOTE — Telephone Encounter (Signed)
 Pt c/o Shortness Of Breath: STAT if SOB developed within the last 24 hours or pt is noticeably SOB on the phone  1. Are you currently SOB (can you hear that pt is SOB on the phone)?   A little  2. How long have you been experiencing SOB?   Ongoing for about a week but getting worse  3. Are you SOB when sitting or when up moving around?   Both sitting and when up and moving around  4. Are you currently experiencing any other symptoms?   Headache, tired   Patient stated he has been getting SOB when doing normal activities.  Patient noted he recently had a chest cold.

## 2023-10-30 NOTE — Telephone Encounter (Signed)
 Called patient, he states he recently was seen by PCP for a cough, they did start him on ABX, and cough meds. They did chest xray- did not show anything.  However, he states he has become extremely short of breath, doing anything. He can be up moving around, but also sitting still he can become SOB. He does mention he has gained weight over the last year- but would like to start working out more, however he is just give out when he tries to do any walking. He also states he does have some swelling in his legs. He has Lasix 20 mg PRN for edema on medication list. He did take his Lasix yesterday, advised that he try to take it yesterday, today and if no improvement he could try the following day to see if swelling or SOB improved. Patient scheduled with Dr.Gollan on 03/17 at 3:20 PM. But also gave ER precautions as well. Advised I would route to MD/RN for further recommendations.   He will check his BP/HR at home as well- states this has been good, but did not offer readings.

## 2023-11-12 NOTE — Progress Notes (Unsigned)
 Date:  11/13/2023   ID:  Stanley Kane, DOB 1965/11/02, MRN [id number]  Patient Location:  [address] [place] [address]   Provider location:   [facility], [place] office  PCP:  [name], MD  Cardiologist:  [facility]  Chief Complaint  Patient presents with   Shortness of Breath    Patient c/o bilateral LE edema and shortness of breath.     History of Present Illness:    Stanley Kane is a 58 y.o. male past medical history of permanent Afib diagnosed in 2013  s/p DCCV 07/06/2012 with recurrent Afib s/p repeat DCCV on 11/25/2014 on Xarelto,  obesity with possible OSA,  anxiety,  prior medication noncompliance. Who presents for follow-up of his permanent atrial fibrillation and shortness of breath  Last seen by myself on October 2024 MRI chest November 2024: large lipoma with internal foci of fat necrosis.  S/p resection 12/24  Kidney stone 6/24, lithotripsy 10/24 Back on Xarelto  Reports recent symptoms of SOB, At the time had URI But started on inhalers, antibiotics Tried lasix 3-4 days in a row Symptoms improved Cough at night Now back to Lasix as needed Leg swelling resolved  Denies chest pain concerning for angina Tolerating metoprolol succinate 25 twice daily, digoxin 0.125 daily Digoxin level low in September 2024  EKG personally reviewed by myself on todays visit EKG Interpretation Date/Time:  Monday November 13 2023 15:25:55 EDT Ventricular Rate:  96 PR Interval:    QRS Duration:  86 QT Interval:  350 QTC Calculation: 442 R Axis:   27  Text Interpretation: Atrial fibrillation When compared with ECG of 09-Jun-2023 10:01, No significant change was found Confirmed by Julien Nordmann ([id number]) on 11/13/2023 3:32:54 PM    Low risk stress test May 2021 Echocardiogram August 2024 EF 60% Recent events reviewed,  Remote episode of syncope  Out of bed 2 AM stood up quickly, developed  symptoms  atrial fibrillation with RVR February 2024  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  successful DCCV on 11/25/2014.   Prior echo from 08/2014 showed an EF of 60-65%, mild MR, mild TR, normal RVSF and PASP.   Prior stress test from 2005 was reportedly normal.   calcium scoring in 09/2016 given atypical chest pain with a score of 0   nuclear stress testing that was negative for evidence of ischemia or scar.  EF was reduced, felt to be underestimated given Afib. We followed this up with an echo in 10/2017 that showed an EF of 45-50%, no RWMA, mildly dilated left atrium measuring 42 mm, RVSF and PASP were normal.     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)    a.) TTE 05/22/2012: EF 50-55%, no RWMAs, mild LAE, norm RVSF, mild MR; b.) TTE 09/26/2017: EF 60-65%, no RWMAs, mild LAE, norm RVSF, triv PR, mild MR/TR; c.) TTE 11/23/2017: EF 45-50%, no RWMAs, mild LAE, norm RVSF, triv MR/PR, mild TR; d.) TTE 04/13/2023: EF 60-65%, no RWMAs, mod LAE, norm RVSF, mild MR   Depression  Difficult intubation    Fatty liver 06/2014   History of kidney stones    Kidney stone    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   a.) MRI chest 07/25/2023: 12.1 x 11.8 x 9.4 cm circumscribed mass to RIGHT chest and lower axilla; overlies oblique musculature and anterior latissimus dorsi   Long-term current use of immunosuppressive biologic agent    a.) secukinumab   NICM (nonischemic cardiomyopathy) (HCC)    a.) EF 05/22/2012: EF 50-55%; b.) TTE 09/26/2014: EF 60-65%; c.) MV 09/06/2017: EF 33-44%; d.) TTE 11/23/2017: EF 45-50%; e.) MV 01/15/2020: EF  45%; f.) MV 11/29/2021: EF 60%; g.) TTE 04/13/2023: EF 60-65%   Obesity    On rivaroxaban therapy    OSA (obstructive sleep apnea)    a.) does not utilize nocturnal PAP therapy   Persistent atrial fibrillation (HCC) 2013   a.) Dx'd in 2013; b.) CHA2DS2-VASc = 1 (HFmrEF) as of 08/09/2023; b.) s/p DCCV 07/06/2012 (150 J x1); c.) s/p DCCV 11/25/2014 (150 J x 1); d.) cardiac rate/rhythm maintained on oral digoxin + metoprolol succinate; chronically anticoagulated using rivaroxaban   Plaque psoriasis    a.) Tx'd with secukinumab injections + topical triamcinolone   Prediabetes    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   hospitalization [facility] - stress with father's illness   Vitamin D deficiency    Past Surgical History:  Procedure Laterality Date   CARDIOVASCULAR STRESS TEST  ~2005   thinks at [facility], told normal   carotid US  08/2014   no plaque   COLONOSCOPY  2000   WNL, for weight loss after started gym   EXTRACORPOREAL SHOCK WAVE LITHOTRIPSY Left 06/01/2023   Procedure: EXTRACORPOREAL SHOCK WAVE LITHOTRIPSY (ESWL);  Surgeon: Sondra Come, MD;  Location: [facility] ORS;  Service: Urology;  Laterality: Left;   LIPOMA EXCISION Right 08/14/2023   Procedure: EXCISION LIPOMA, chest wall;  Surgeon: Kandis Cocking, MD;  Location: [facility] ORS;  Service: General;  Laterality: Right;   MRI  08/2014   brain - WNL, skull base C1/2 articulation   TONSILLECTOMY AND ADENOIDECTOMY  childhood   US ECHOCARDIOGRAPHY  08/2014   EF 60%, nl vent fxn, mildly dilated LA, mild MR, mild TR     Current Meds  Medication Sig   DENTA 5000 PLUS 1.1 % CREA dental cream Place 1 Application onto teeth daily.   digoxin (LANOXIN) 0.125 MG tablet Take 1 tablet (0.125 mg total) by mouth daily.   escitalopram (LEXAPRO) 20 MG tablet Take 1 tablet (20 mg total) by mouth daily.   furosemide (LASIX) 20 MG tablet Take 1 tablet (20 mg total) by mouth as needed for edema.   hydroxypropyl methylcellulose / hypromellose (ISOPTO TEARS /  GONIOVISC) 2.5 % ophthalmic solution Place 1 drop into both eyes as needed for dry eyes.   metoprolol succinate (TOPROL-XL) 25 MG 24 hr tablet Take 1 tablet (25 mg total) by mouth in the morning and at bedtime.   rivaroxaban (XARELTO) 20 MG TABS tablet Take 1 tablet (20 mg total) by mouth daily with supper.   Secukinumab, 300 MG Dose, (COSENTYX, 300 MG DOSE,) 150 MG/ML SOSY Inject 2 mLs (300 mg total) into the skin every 30 (thirty) days.   triamcinolone cream (KENALOG) 0.1 % APPLY TO AFFECTED AREA TWICE A DAY AS DIRECTED   Vitamin D, Ergocalciferol, 50000 units CAPS Take 5,000 Units by mouth once a week.     Allergies:   Vicodin [hydrocodone-acetaminophen]   Social History   Tobacco Use  Smoking status: Former    Current packs/day: 0.00    Types: Cigarettes    Quit date: 08/29/1996    Years since quitting: 27.2    Passive exposure: Past   Smokeless tobacco: Never  Vaping Use   Vaping status: Never Used  Substance Use Topics   Alcohol use: Not Currently    Alcohol/week: 2.0 standard drinks of alcohol    Types: 2 Cans of beer per week    Comment: quit beginning of 2020   Drug use: Yes    Types: Cocaine     Family Hx: The patient's family history includes CAD (age of onset: 4) in his sister; Cirrhosis in his father; Coronary artery disease (age of onset: 66) in his father; Diabetes in his mother; GI Disease in his sister; Heart attack in his father; Heart disease in his mother; Hypercalcemia in his father; Hypertension in his father. There is no history of Stroke.  ROS:   Please see the history of present illness.    Review of Systems  Constitutional: Negative.   Respiratory: Negative.    Cardiovascular: Negative.   Gastrointestinal: Negative.   Musculoskeletal: Negative.   Neurological: Negative.   Psychiatric/Behavioral: Negative.    All other systems reviewed and are negative.    Labs/Other Tests and Data Reviewed:    Recent Labs: 03/08/2023: ALT 30; TSH  2.28 05/03/2023: Magnesium 2.0 08/08/2023: BUN 14; Creatinine, Ser 0.92; Hemoglobin 14.7; Platelets 322; Potassium 3.8; Sodium 134   Recent Lipid Panel Lab Results  Component Value Date/Time   CHOL 166 06/27/2023 08:49 AM   CHOL 157 09/25/2014 05:31 PM   CHOL 175 10/10/2011 12:00 AM   TRIG 196.0 (H) 06/27/2023 08:49 AM   TRIG 250 (H) 09/25/2014 05:31 PM   TRIG 93 10/10/2011 12:00 AM   HDL 44.40 06/27/2023 08:49 AM   HDL 33 (L) 09/25/2014 05:31 PM   CHOLHDL 4 06/27/2023 08:49 AM   LDLCALC 82 06/27/2023 08:49 AM   LDLCALC 74 09/25/2014 05:31 PM   LDLDIRECT 89.0 03/08/2023 09:06 AM    Wt Readings from Last 3 Encounters:  11/13/23 249 lb 4 oz (113.1 kg)  10/03/23 245 lb 6 oz (111.3 kg)  08/24/23 238 lb 9.6 oz (108.2 kg)     Exam:    Vital Signs: Vital signs may also be detailed in the HPI BP 110/64 (BP Location: Left Arm, Patient Position: Sitting, Cuff Size: Normal)   Pulse 96   Ht 5\' 8"  (1.727 m)   Wt 249 lb 4 oz (113.1 kg)   SpO2 96%   BMI 37.90 kg/m   Constitutional:  oriented to person, place, and time. No distress.  HENT:  Head: Grossly normal Eyes:  no discharge. No scleral icterus.  Neck: No JVD, no carotid bruits  Cardiovascular: Irregularly irregular no murmurs appreciated Pulmonary/Chest: Clear to auscultation bilaterally, no wheezes or rails Abdominal: Soft.  no distension.  no tenderness.  Musculoskeletal: Normal range of motion Neurological:  normal muscle tone. Coordination normal. No atrophy Skin: Skin warm and dry Psychiatric: normal affect, pleasant   ASSESSMENT & PLAN:    Permanent atrial fibrillation Rate well controlled on digoxin, metoprolol succinate 25 twice daily Digoxin level low 6 months ago Tolerating anticoagulation, back on xarelto after lithotripsy Recommend he monitor heart rate at home, for now continue metoprolol succinate 25 twice daily  Dilated cardiomyopathy (HCC) Normal ejection fraction on echocardiogram August  2024 Recently required several doses of Lasix for shortness of breath in the setting of URI, symptoms resolved  Now back to Lasix as needed  Obesity We have encouraged continued exercise, careful diet management in an effort to lose weight.   Signed, Julien Nordmann, MD  11/13/2023 3:32 PM    [facility] [address], [place], [place] [address]

## 2023-11-13 ENCOUNTER — Encounter: Payer: Self-pay | Admitting: Cardiovascular Disease

## 2023-11-13 ENCOUNTER — Ambulatory Visit: Attending: Cardiovascular Disease | Admitting: Cardiovascular Disease

## 2023-11-13 VITALS — BP 110/64 | HR 96 | Ht 68.0 in | Wt 249.2 lb

## 2023-11-13 DIAGNOSIS — I5032 Chronic diastolic (congestive) heart failure: Secondary | ICD-10-CM

## 2023-11-13 DIAGNOSIS — R55 Syncope and collapse: Secondary | ICD-10-CM | POA: Diagnosis not present

## 2023-11-13 DIAGNOSIS — I420 Dilated cardiomyopathy: Secondary | ICD-10-CM

## 2023-11-13 DIAGNOSIS — I4891 Unspecified atrial fibrillation: Secondary | ICD-10-CM

## 2023-11-13 DIAGNOSIS — I959 Hypotension, unspecified: Secondary | ICD-10-CM

## 2023-11-13 DIAGNOSIS — I5022 Chronic systolic (congestive) heart failure: Secondary | ICD-10-CM | POA: Diagnosis not present

## 2023-11-13 DIAGNOSIS — R0609 Other forms of dyspnea: Secondary | ICD-10-CM | POA: Diagnosis not present

## 2023-11-13 DIAGNOSIS — I4821 Permanent atrial fibrillation: Secondary | ICD-10-CM

## 2023-11-13 MED ORDER — RIVAROXABAN 20 MG PO TABS: 20.0000 mg | ORAL_TABLET | Freq: Every day | ORAL | 1 refills | Status: AC

## 2023-11-13 NOTE — Telephone Encounter (Signed)
 Patient seen in clinic. All questions and concerns addressed at that time.

## 2023-11-13 NOTE — Patient Instructions (Signed)

## 2023-11-20 DIAGNOSIS — L400 Psoriasis vulgaris: Secondary | ICD-10-CM | POA: Diagnosis not present

## 2023-11-20 DIAGNOSIS — Z79899 Other long term (current) drug therapy: Secondary | ICD-10-CM | POA: Diagnosis not present

## 2023-11-21 ENCOUNTER — Telehealth: Payer: Self-pay | Admitting: Family Medicine

## 2023-11-21 ENCOUNTER — Ambulatory Visit (INDEPENDENT_AMBULATORY_CARE_PROVIDER_SITE_OTHER): Payer: Medicaid Other | Admitting: Family Medicine

## 2023-11-21 ENCOUNTER — Encounter: Payer: Self-pay | Admitting: Family Medicine

## 2023-11-21 VITALS — BP 118/80 | HR 71 | Temp 98.0°F | Ht 68.0 in | Wt 255.0 lb

## 2023-11-21 DIAGNOSIS — M79641 Pain in right hand: Secondary | ICD-10-CM | POA: Diagnosis not present

## 2023-11-21 DIAGNOSIS — I4821 Permanent atrial fibrillation: Secondary | ICD-10-CM

## 2023-11-21 DIAGNOSIS — G5603 Carpal tunnel syndrome, bilateral upper limbs: Secondary | ICD-10-CM | POA: Diagnosis not present

## 2023-11-21 DIAGNOSIS — M79642 Pain in left hand: Secondary | ICD-10-CM

## 2023-11-21 DIAGNOSIS — K625 Hemorrhage of anus and rectum: Secondary | ICD-10-CM | POA: Diagnosis not present

## 2023-11-21 DIAGNOSIS — H5713 Ocular pain, bilateral: Secondary | ICD-10-CM

## 2023-11-21 DIAGNOSIS — F411 Generalized anxiety disorder: Secondary | ICD-10-CM

## 2023-11-21 DIAGNOSIS — D171 Benign lipomatous neoplasm of skin and subcutaneous tissue of trunk: Secondary | ICD-10-CM

## 2023-11-21 LAB — CBC WITH DIFFERENTIAL/PLATELET
Basophils Absolute: 0.1 10*3/uL (ref 0.0–0.1)
Basophils Relative: 1.3 % (ref 0.0–3.0)
Eosinophils Absolute: 0.7 10*3/uL (ref 0.0–0.7)
Eosinophils Relative: 9.3 % — ABNORMAL HIGH (ref 0.0–5.0)
HCT: 42.6 % (ref 39.0–52.0)
Hemoglobin: 14.4 g/dL (ref 13.0–17.0)
Lymphocytes Relative: 36.6 % (ref 12.0–46.0)
Lymphs Abs: 2.9 10*3/uL (ref 0.7–4.0)
MCHC: 33.8 g/dL (ref 30.0–36.0)
MCV: 96.6 fl (ref 78.0–100.0)
Monocytes Absolute: 0.7 10*3/uL (ref 0.1–1.0)
Monocytes Relative: 8.9 % (ref 3.0–12.0)
Neutro Abs: 3.5 10*3/uL (ref 1.4–7.7)
Neutrophils Relative %: 43.9 % (ref 43.0–77.0)
Platelets: 348 10*3/uL (ref 150.0–400.0)
RBC: 4.41 Mil/uL (ref 4.22–5.81)
RDW: 14.1 % (ref 11.5–15.5)
WBC: 7.9 10*3/uL (ref 4.0–10.5)

## 2023-11-21 MED ORDER — NIFEDIPINE 0.3 % OINTMENT: 1.0000 | TOPICAL_OINTMENT | Freq: Three times a day (TID) | CUTANEOUS | 0 refills | Status: DC

## 2023-11-21 MED ORDER — ESCITALOPRAM OXALATE 20 MG PO TABS
20.0000 mg | ORAL_TABLET | Freq: Every day | ORAL | 3 refills | Status: AC
Start: 2023-11-21 — End: ?

## 2023-11-21 NOTE — Patient Instructions (Addendum)
 Labs today  For anal fissure - try nifedipine cream sent to pharmacy. Restart miralax 1/2-1 capful daily, hold for diarrhea. Do sitz baths daily, good fiber and water in diet.  For hand and first 2 finger pain/numbness - possible trigger finger vs carpal tunnel. Try wrist braces overnight, may try topical diclofenac (voltaren) gel 2-3 times daily as needed. We will refer you to hand surgeon for further evaluation in [place].  Vision screen today.

## 2023-11-21 NOTE — Addendum Note (Signed)
 Addended by: [name] on: 11/21/2023 05:08 PM   Modules accepted: Orders

## 2023-11-21 NOTE — Telephone Encounter (Addendum)
 Spoke with patient - [facility] in [place].

## 2023-11-21 NOTE — Telephone Encounter (Signed)
 Copied from CRM [id number]. Topic: Clinical - Prescription Issue >> Nov 21, 2023  2:26 PM [place] wrote: Reason for CRM: Pharmacist from [facility] called, they do not have nifedipine 0.3 % ointment as it is a compound.

## 2023-11-21 NOTE — Progress Notes (Unsigned)
 Ph: [phone] Fax: [phone]   Patient ID: Stanley Kane, Stanley Kane    DOB: 1966-02-24, 58 y.o.   MRN: [id number]  This visit was conducted in person.  BP 118/80   Pulse 71   Temp 98 F (36.7 C) (Oral)   Ht 5\' 8"  (1.727 m)   Wt 255 lb (115.7 kg)   SpO2 96%   BMI 38.77 kg/m    Vision Screening   Right eye Left eye Both eyes  Without correction     With correction 20/25 20/25 20/20    CC: 4 mo f/u visit  Subjective:   HPI: Stanley Kane is a 58 y.o. 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   1-2 wk h/o rectal bleeding with bowel movements, last episode was 3-4 days ago. Significant pain after BM.  He held his xarelto for 3 days without benefit.  No h/o hemorrhoids. He has had anal fissure previously age 44s.  Had colonoscopy at that time (2000) No abd pain, nausea/vomiting,  diarrhea.  Some constipation  He takes toprol XL BID as well as lasix 20mg  PRN.   Cologuard negative 05/2023  Notes bilateral thumb and index finger numnbness and stiffness, h/o carpal tunnel syndrome. Occ burning pain.   Notes bilateral eye pain for 5-6 months, worse after new glasses. No redness of eyes. Eyes get dry - manages with eye drops.   Working on getting disability through the courts.      Relevant past medical, surgical, family and social history reviewed and updated as indicated. Interim medical history since our last visit reviewed. Allergies and medications reviewed and updated. Outpatient Medications Prior to Visit  Medication Sig Dispense Refill   DENTA 5000 PLUS 1.1 % CREA dental cream Place 1 Application onto teeth daily.     digoxin (LANOXIN) 0.125 MG tablet Take 1 tablet (0.125 mg total) by mouth daily. 90 tablet 3   furosemide (LASIX) 20 MG tablet Take 1 tablet (20 mg total) by mouth as needed for edema. 90 tablet 3   metoprolol succinate (TOPROL-XL) 25 MG 24 hr  tablet Take 1 tablet (25 mg total) by mouth in the morning and at bedtime. 180 tablet 3   rivaroxaban (XARELTO) 20 MG TABS tablet Take 1 tablet (20 mg total) by mouth daily with supper. 90 tablet 1   Secukinumab, 300 MG Dose, (COSENTYX, 300 MG DOSE,) 150 MG/ML SOSY Inject 2 mLs (300 mg total) into the skin every 30 (thirty) days.     triamcinolone cream (KENALOG) 0.1 % APPLY TO AFFECTED AREA TWICE A DAY AS DIRECTED 80 g 0   Vitamin D, Ergocalciferol, 50000 units CAPS Take 5,000 Units by mouth once a week.     escitalopram (LEXAPRO) 20 MG tablet Take 1 tablet (20 mg total) by mouth daily. 30 tablet 4   benzonatate (TESSALON) 100 MG capsule Take 1 capsule (100 mg total) by mouth 3 (three) times daily as needed for cough. (Patient not taking: Reported on 11/13/2023) 30 capsule 0   doxycycline (VIBRA-TABS) 100 MG tablet Take 1 tablet (100 mg total) by mouth 2 (two) times daily. (Patient not taking: Reported on 11/13/2023) 14 tablet 0   hydroxypropyl methylcellulose / hypromellose (ISOPTO TEARS / GONIOVISC) 2.5 % ophthalmic solution Place 1 drop into both eyes as needed for dry eyes.     No facility-administered medications prior to visit.     Per HPI  unless specifically indicated in ROS section below Review of Systems  Objective:  BP 118/80   Pulse 71   Temp 98 F (36.7 C) (Oral)   Ht 5\' 8"  (1.727 m)   Wt 255 lb (115.7 kg)   SpO2 96%   BMI 38.77 kg/m   Wt Readings from Last 3 Encounters:  11/21/23 255 lb (115.7 kg)  11/13/23 249 lb 4 oz (113.1 kg)  10/03/23 245 lb 6 oz (111.3 kg)      Physical Exam Vitals and nursing note reviewed.  Constitutional:      Appearance: Normal appearance. He is not ill-appearing.  HENT:     Head: Normocephalic and atraumatic.     Mouth/Throat:     Mouth: Mucous membranes are moist.     Pharynx: Oropharynx is clear. No oropharyngeal exudate or posterior oropharyngeal erythema.  Eyes:     Extraocular Movements: Extraocular movements intact.     Pupils:  Pupils are equal, round, and reactive to light.  Cardiovascular:     Rate and Rhythm: Normal rate. Rhythm irregularly irregular.     Pulses: Normal pulses.     Heart sounds: Normal heart sounds. No murmur heard. Pulmonary:     Effort: Pulmonary effort is normal. No respiratory distress.     Breath sounds: Normal breath sounds. No wheezing, rhonchi or rales.  Genitourinary:    Rectum: Tenderness and anal fissure present. No mass or external hemorrhoid.     Comments: Possible left sided tender anterior anal fissure Musculoskeletal:     Right lower leg: No edema.     Left lower leg: No edema.     Comments:  No obvious triggering No active synovitis   Skin:    General: Skin is warm and dry.     Findings: No rash.  Neurological:     General: No focal deficit present.     Mental Status: He is alert.     Comments:  Neg phalen, tinnel bilaterally  Psychiatric:        Mood and Affect: Mood normal.        Behavior: Behavior normal.       Results for orders placed or performed in visit on 11/21/23  CBC with Differential/Platelet   Collection Time: 11/21/23  1:22 PM  Result Value Ref Range   WBC 7.9 4.0 - 10.5 K/uL   RBC 4.41 4.22 - 5.81 Mil/uL   Hemoglobin 14.4 13.0 - 17.0 g/dL   HCT 28.4 13.2 - 44.0 %   MCV 96.6 78.0 - 100.0 fl   MCHC 33.8 30.0 - 36.0 g/dL   RDW 10.2 72.5 - 36.6 %   Platelets 348.0 150.0 - 400.0 K/uL   Neutrophils Relative % 43.9 43.0 - 77.0 %   Lymphocytes Relative 36.6 12.0 - 46.0 %   Monocytes Relative 8.9 3.0 - 12.0 %   Eosinophils Relative 9.3 (H) 0.0 - 5.0 %   Basophils Relative 1.3 0.0 - 3.0 %   Neutro Abs 3.5 1.4 - 7.7 K/uL   Lymphs Abs 2.9 0.7 - 4.0 K/uL   Monocytes Absolute 0.7 0.1 - 1.0 K/uL   Eosinophils Absolute 0.7 0.0 - 0.7 K/uL   Basophils Absolute 0.1 0.0 - 0.1 K/uL    Lab Results  Component Value Date   CHOL 166 06/27/2023   HDL 44.40 06/27/2023   LDLCALC 82 06/27/2023   LDLDIRECT 89.0 03/08/2023   TRIG 196.0 (H) 06/27/2023    CHOLHDL 4 06/27/2023    Lab Results  Component Value  Date   NA 134 (L) 08/08/2023   CL 101 08/08/2023   K 3.8 08/08/2023   CO2 24 08/08/2023   BUN 14 08/08/2023   CREATININE 0.92 08/08/2023   GFRNONAA >60 08/08/2023   CALCIUM 8.6 (L) 08/08/2023   PHOS 3.7 10/10/2011   ALBUMIN 4.2 03/08/2023   GLUCOSE 88 08/08/2023    Lab Results  Component Value Date   ALT 30 03/08/2023   AST 28 03/08/2023   ALKPHOS 49 03/08/2023   BILITOT 0.6 03/08/2023       11/21/2023    1:20 PM 10/03/2023    2:58 PM 06/26/2023    3:31 PM 03/15/2023   11:27 AM 12/27/2021   12:29 PM  Depression screen PHQ 2/9  Decreased Interest 0 1 1 2 2   Down, Depressed, Hopeless 1 1 1 1 1   PHQ - 2 Score 1 2 2 3 3   Altered sleeping 3 2 2 2  0  Tired, decreased energy 2 2 2 3 2   Change in appetite 0 2 0 1 0  Feeling bad or failure about yourself  1 1 0 1 0  Trouble concentrating 0 1 0 0 0  Moving slowly or fidgety/restless 0 0 0 0 0  Suicidal thoughts 0 0 0 0 0  PHQ-9 Score 7 10 6 10 5   Difficult doing work/chores Somewhat difficult Somewhat difficult Somewhat difficult Somewhat difficult Not difficult at all       11/21/2023    1:20 PM 10/03/2023    2:58 PM 06/26/2023    3:31 PM 03/15/2023   11:27 AM  GAD 7 : Generalized Anxiety Score  Nervous, Anxious, on Edge 2 2 3 3   Control/stop worrying 2 2 2 2   Worry too much - different things 2 2 3 2   Trouble relaxing 0 1 2 1   Restless 0 0 0 1  Easily annoyed or irritable 1 1 1 1   Afraid - awful might happen 0 1 2 0  Total GAD 7 Score 7 9 13 10   Anxiety Difficulty Somewhat difficult Somewhat difficult Somewhat difficult Somewhat difficult   Assessment & Plan:   Problem List Items Addressed This Visit     Permanent atrial fibrillation (HCC)   Chronic, continues BB, xarelto, digoxin Appreciate cardiology care.       GAD (generalized anxiety disorder)   Stable period - Lexapro refilled      Relevant Medications   escitalopram (LEXAPRO) 20 MG tablet   Carpal  tunnel syndrome   H/o this - but today's exam without reproducible symptoms with tinel/phalen testing.  Regardless, did recommend nightly wrist brace use.  Refer to hand surgery per above.       Relevant Medications   escitalopram (LEXAPRO) 20 MG tablet   Other Relevant Orders   Ambulatory referral to Hand Surgery   RESOLVED: Lipoma of posterior chest wall   S/p excision 07/2023, tolerated well.       BRBPR (bright red blood per rectum) - Primary   Suspect anal fissure based on history and exam.  No recent colonoscopy but he had normal Cologuard 05/2023.  Discussed treatment - avoid constipation (goal 1 soft stool/day), avoiding prolonged time on commode, rec warm sitz baths, Rx nifedipine 0.3% compounded ointment topically ([facility] [place]).  Rec start miralax PRN to avoid constipation.  Rec treatment x3-4 wks, update if not improving for GI referral, update sooner if worsening symptoms. He agrees with plan.  Check CBC today.  Relevant Orders   CBC with Differential/Platelet (Completed)   Bilateral hand pain   Bilateral thumb/index finger pain with triggering. Exam not consistent with carpal tunnel syndrome although has h/o this.  Rec further eval by hand surgeon - referral placed to [facility] per pt preference      Relevant Orders   Ambulatory referral to Hand Surgery   Eye pain   Bilateral eye pain for months, worse after new glasses  Snellen visual acuity testing normal today Recommend return to his eye doctor for further evaluation.         Meds ordered this encounter  Medications   DISCONTD: nifedipine 0.3 % ointment    Sig: Place 1 Application rectally in the morning, at noon, and at bedtime. For 3 weeks    Dispense:  30 g    Refill:  0   escitalopram (LEXAPRO) 20 MG tablet    Sig: Take 1 tablet (20 mg total) by mouth daily.    Dispense:  90 tablet    Refill:  3    Orders Placed This Encounter  Procedures   CBC with  Differential/Platelet   Ambulatory referral to Hand Surgery    Referral Priority:   Routine    Referral Type:   Surgical    Referral Reason:   Specialty Services Required    Requested Specialty:   Hand Surgery    Number of Visits Requested:   1    Patient Instructions  Labs today  For anal fissure - try nifedipine cream sent to pharmacy. Restart miralax 1/2-1 capful daily, hold for diarrhea. Do sitz baths daily, good fiber and water in diet.  For hand and first 2 finger pain/numbness - possible trigger finger vs carpal tunnel. Try wrist braces overnight, may try topical diclofenac (voltaren) gel 2-3 times daily as needed. We will refer you to hand surgeon for further evaluation in [place].  Vision screen today.   Follow up plan: No follow-ups on file.  [name], MD

## 2023-11-23 ENCOUNTER — Encounter: Payer: Self-pay | Admitting: Family Medicine

## 2023-11-23 DIAGNOSIS — K625 Hemorrhage of anus and rectum: Secondary | ICD-10-CM | POA: Insufficient documentation

## 2023-11-23 DIAGNOSIS — M79642 Pain in left hand: Secondary | ICD-10-CM | POA: Insufficient documentation

## 2023-11-23 DIAGNOSIS — H5710 Ocular pain, unspecified eye: Secondary | ICD-10-CM | POA: Insufficient documentation

## 2023-11-23 NOTE — Assessment & Plan Note (Addendum)
 Suspect anal fissure based on history and exam.  No recent colonoscopy but he had normal Cologuard 05/2023.  Discussed treatment - avoid constipation (goal 1 soft stool/day), avoiding prolonged time on commode, rec warm sitz baths, Rx nifedipine 0.3% compounded ointment topically ([facility] in [place]).  Rec start miralax PRN to avoid constipation.  Rec treatment x3-4 wks, update if not improving for GI referral, update sooner if worsening symptoms. He agrees with plan.  Check CBC today.

## 2023-11-23 NOTE — Assessment & Plan Note (Signed)
 Bilateral eye pain for months, worse after new glasses  Snellen visual acuity testing normal today Recommend return to his eye doctor for further evaluation.

## 2023-11-23 NOTE — Assessment & Plan Note (Signed)
 S/p excision 07/2023, tolerated well.

## 2023-11-23 NOTE — Assessment & Plan Note (Signed)
 Stable period - Lexapro refilled

## 2023-11-23 NOTE — Assessment & Plan Note (Signed)
 Chronic, continues BB, xarelto, digoxin Appreciate cardiology care.

## 2023-11-23 NOTE — Assessment & Plan Note (Signed)
 H/o this - but today's exam without reproducible symptoms with tinel/phalen testing.  Regardless, did recommend nightly wrist brace use.  Refer to hand surgery per above.

## 2023-11-23 NOTE — Assessment & Plan Note (Signed)
 Bilateral thumb/index finger pain with triggering. Exam not consistent with carpal tunnel syndrome although has h/o this.  Rec further eval by hand surgeon - referral placed to [facility] per pt preference

## 2023-12-04 DIAGNOSIS — G5603 Carpal tunnel syndrome, bilateral upper limbs: Secondary | ICD-10-CM | POA: Diagnosis not present

## 2023-12-21 DIAGNOSIS — G5603 Carpal tunnel syndrome, bilateral upper limbs: Secondary | ICD-10-CM | POA: Diagnosis not present

## 2023-12-26 DIAGNOSIS — G5603 Carpal tunnel syndrome, bilateral upper limbs: Secondary | ICD-10-CM | POA: Diagnosis not present

## 2023-12-27 ENCOUNTER — Ambulatory Visit: Payer: Self-pay

## 2023-12-27 DIAGNOSIS — K602 Anal fissure, unspecified: Secondary | ICD-10-CM

## 2023-12-27 DIAGNOSIS — K625 Hemorrhage of anus and rectum: Secondary | ICD-10-CM

## 2023-12-27 NOTE — Telephone Encounter (Signed)
 Did nifedipine  ointment help symptoms at all? Given ongoing symptoms, recommend GI evaluation - referral placed.  If significant rectal bleed with clots or worsening symptoms, agree with ER eval. Otherwise will await GI eval.

## 2023-12-27 NOTE — Telephone Encounter (Signed)
 Received call from triage Patient refused ED. They will advise patient that we are sending for review from provider but should follow  recommendations from triage.

## 2023-12-27 NOTE — Telephone Encounter (Signed)
 Copied from CRM [id number]. Topic: Clinical - Red Word Triage >> Dec 27, 2023  3:00 PM Trula Gable C wrote: [facility] that prompted transfer to Nurse Triage: Patient called stated he came to see Dr.Gutierrez about bleeding wen going to the bathroom about a month ago, and got medication for it but he is still bleeding when using the bathroom   Chief Complaint: Rectal Bleeding, Feeling Weak Symptoms: bleeding, weak Frequency: every time for the past 6 months or so Pertinent Negatives: Patient denies abdominal pain, fever, nausea, vomiting Disposition: [] ED /[] Urgent Care (no appt availability in office) / [] Appointment(In office/virtual)/ []  [facility]/ [] Home Care/ [x] Refused Recommended Disposition /[] [facility]/ []  Follow-up with PCP Additional Notes: Patient called and advised that he came in to the office 11/21/2023 for rectal bleeding and he said he still is not any better. Patient thinks he may have another fissure. Patient states he has been burning. Patient has been using stool softener he was prescribed. Patient is advised that the recommendation at this time with him having rectal bleeding and taking a blood thinner is to go to the Emergency Room for further evaluation.  Patient also states that he feels generally weak at times as well. Patient states that he does not want to go to to the Emergency Room. Patient wanted a message sent to his PCP to see about a possible referral (he believes they discussed a possible referral at his last appointment if his symptoms did not improve. Patient is again advised that the Emergency Room is the recommendation and if he gets worse not to hesitate to go to the Emergency Room for immediate medical attention. Patient verbalized understanding and stated that he would go if he felt worse at any time.  Reason for Disposition  Taking Coumadin (warfarin) or other strong blood thinner, or known bleeding disorder (e.g.,  thrombocytopenia)  Answer Assessment - Initial Assessment Questions 1. APPEARANCE of BLOOD: "What color is it?" "Is it passed separately, on the surface of the stool, or mixed in with the stool?"      Bright red blood 2. AMOUNT: "How much blood was passed?"      ---- 3. FREQUENCY: "How many times has blood been passed with the stools?"      Every time  2-3 times a week 4. ONSET: "When was the blood first seen in the stools?" (Days or weeks)      About six months 5. DIARRHEA: "Is there also some diarrhea?" If Yes, ask: "How many diarrhea stools in the past 24 hours?"      Just soft from stool softener 6. CONSTIPATION: "Do you have constipation?" If Yes, ask: "How bad is it?"     ---- 7. RECURRENT SYMPTOMS: "Have you had blood in your stools before?" If Yes, ask: "When was the last time?" and "What happened that time?"      A fissure in his 20s. 8. BLOOD THINNERS: "Do you take any blood thinners?" (e.g., Coumadin/warfarin, Pradaxa /dabigatran , aspirin )     Yes  Xarelto  9. OTHER SYMPTOMS: "Do you have any other symptoms?"  (e.g., abdomen pain, vomiting, dizziness, fever)     Generalized weakness sometimes  Protocols used: Rectal Bleeding-A-AH

## 2023-12-27 NOTE — Telephone Encounter (Signed)
 This RN called the [facility] and advised them of ER Refusal by the patient at this time.

## 2023-12-27 NOTE — Addendum Note (Signed)
 Addended by: Claire Crick on: 12/27/2023 05:04 PM   Modules accepted: Orders

## 2023-12-28 NOTE — Telephone Encounter (Signed)
Lvm asking pt to call back.  Need to get answer to Dr. Synthia Innocent question and relay his message.

## 2023-12-29 NOTE — Telephone Encounter (Signed)
 Copied from CRM [id number]. Topic: Clinical - Red Word Triage >> Dec 27, 2023  3:00 PM Albertha Alosa wrote: [facility] that prompted transfer to Nurse Triage: Patient called stated he came to see Dr.Gutierrez about bleeding wen going to the bathroom about a month ago, and got medication for it but he is still bleeding when using the bathroom >> Dec 28, 2023  3:54 PM Chuck Crater wrote: Patient is returning a call from nurse. Dr. Ocie Belt message has been relayed and Patient stated that the Nifedipine  ointment helps a little but does not stop the bleeding really when going to the bathroom. He stated that if it worsens he will go to the ER.

## 2023-12-29 NOTE — Telephone Encounter (Addendum)
 GI referral has been placed.  He may call [facility] to request an appointment at [phone].

## 2023-12-29 NOTE — Telephone Encounter (Signed)
 Spoke with patient and reviewed information. Verified patients understanding. Patient will call office with any further questions.

## 2024-01-07 NOTE — Progress Notes (Deleted)
 Stanley Kane [id number] [birth year]   Chief Complaint: Rectal bleeding  Referring Provider: Claire Crick, MD Primary GI MD: Para Bold  HPI: Stanley Kane is a 58 y.o. 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    Patient seen by PCP 11/21/2023 for complaint of BRBPR.  Fissure was suspected based on history and exam.  No recent colonoscopy but patient did have a normal Cologuard 05/2023.  CBC with eosinophilia with normal WBC, otherwise normal with hemoglobin of 14.4.  Nifedipine  0.3% compounded ointment was sent to pharmacy.  Per telephone encounter, this helped some but patient continued to have bleeding with bowel movements.  Previous GI Procedures   CT renal stone study 02/20/2023 1. 3 x 4 mm stone in the proximal left ureter with mild hydroureteronephrosis. 2. Slight hyperdensity of the renal papillary regions bilaterally, which could indicate medullary sponge kidney. 3. Cholelithiasis without evidence of cholecystitis.  Colonoscopy 2000    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)    a.) TTE 05/22/2012: EF 50-55%, no RWMAs, mild LAE, norm RVSF, mild MR; b.) TTE 09/26/2017: EF 60-65%, no RWMAs, mild LAE, norm RVSF, triv PR, mild MR/TR; c.) TTE 11/23/2017: EF 45-50%, no RWMAs, mild LAE, norm RVSF, triv MR/PR, mild TR; d.) TTE 04/13/2023: EF 60-65%, no RWMAs, mod LAE, norm RVSF, mild MR    Depression    Difficult intubation    Fatty liver 06/2014   History of kidney stones    Kidney stone    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   a.) MRI chest 07/25/2023: 12.1 x 11.8 x 9.4 cm circumscribed mass to RIGHT chest and lower axilla; overlies oblique musculature and anterior latissimus dorsi   Lipoma of posterior chest wall 12/27/2021   S/p excision 07/2023- pathology: lipoma (8.5 x 4.5 x 3.6 cm well circumscribed) with fat necrosis      Long-term current use of immunosuppressive biologic agent    a.) secukinumab   NICM (nonischemic cardiomyopathy) (HCC)    a.) EF 05/22/2012: EF 50-55%; b.) TTE 09/26/2014: EF 60-65%; c.) MV 09/06/2017: EF 33-44%; d.) TTE 11/23/2017: EF 45-50%; e.) MV 01/15/2020: EF 45%; f.) MV 11/29/2021: EF 60%; g.) TTE 04/13/2023: EF 60-65%   Obesity    On rivaroxaban  therapy    OSA (obstructive sleep apnea)    a.) does not utilize nocturnal PAP therapy   Persistent atrial fibrillation (HCC) 2013   a.) Dx'd in 2013; b.) CHA2DS2-VASc = 1 (HFmrEF) as of 08/09/2023; b.) s/p DCCV 07/06/2012 (150 J x1); c.) s/p DCCV 11/25/2014 (150 J x 1); d.) cardiac rate/rhythm maintained on oral digoxin  + metoprolol  succinate; chronically anticoagulated using rivaroxaban    Plaque psoriasis    a.) Tx'd with secukinumab injections + topical triamcinolone    Prediabetes    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   hospitalization [facility] - stress with father's illness   Vitamin D  deficiency     Past Surgical History:  Procedure Laterality Date   CARDIOVASCULAR STRESS TEST  ~  2005   thinks at [facility], told normal   carotid us   08/2014   no plaque   COLONOSCOPY  2000   WNL, for weight loss after started gym   EXTRACORPOREAL SHOCK WAVE LITHOTRIPSY Left 06/01/2023   Procedure: EXTRACORPOREAL SHOCK WAVE LITHOTRIPSY (ESWL);  Surgeon: Lawerence Pressman, MD;  Location: [facility] ORS;  Service: Urology;  Laterality: Left;   LIPOMA EXCISION Right 08/14/2023   Procedure: EXCISION LIPOMA, chest wall;   Surgeon: Barrett Lick, MD;  Location: [facility] ORS;  Service: General;  Laterality: Right;   MRI  08/2014   brain - WNL, skull base C1/2 articulation   TONSILLECTOMY AND ADENOIDECTOMY  childhood   US  ECHOCARDIOGRAPHY  08/2014   EF 60%, nl vent fxn, mildly dilated LA, mild MR, mild TR    Current Outpatient Medications  Medication Sig Dispense Refill   DENTA 5000 PLUS 1.1 % CREA dental cream Place 1 Application onto teeth daily.     digoxin  (LANOXIN ) 0.125 MG tablet Take 1 tablet (0.125 mg total) by mouth daily. 90 tablet 3   escitalopram  (LEXAPRO ) 20 MG tablet Take 1 tablet (20 mg total) by mouth daily. 90 tablet 3   furosemide  (LASIX ) 20 MG tablet Take 1 tablet (20 mg total) by mouth as needed for edema. 90 tablet 3   metoprolol  succinate (TOPROL -XL) 25 MG 24 hr tablet Take 1 tablet (25 mg total) by mouth in the morning and at bedtime. 180 tablet 3   nifedipine  0.3 % ointment Place 1 Application rectally in the morning, at noon, and at bedtime. For 3 weeks 30 g 0   rivaroxaban  (XARELTO ) 20 MG TABS tablet Take 1 tablet (20 mg total) by mouth daily with supper. 90 tablet 1   Secukinumab, 300 MG Dose, (COSENTYX, 300 MG DOSE,) 150 MG/ML SOSY Inject 2 mLs (300 mg total) into the skin every 30 (thirty) days.     triamcinolone  cream (KENALOG ) 0.1 % APPLY TO AFFECTED AREA TWICE A DAY AS DIRECTED 80 g 0   Vitamin D , Ergocalciferol , 50000 units CAPS Take 5,000 Units by mouth once a week.     No current facility-administered medications for this visit.    Allergies as of 01/08/2024 - Review Complete 11/23/2023  Allergen Reaction Noted   Vicodin [hydrocodone-acetaminophen ]  04/09/2012    Family History  Problem Relation Age of Onset   Diabetes Mother    Heart disease Mother    Hypertension Father    Coronary artery disease Father 54   Heart attack Father    Cirrhosis Father    Hypercalcemia Father    CAD Sister 26       deceased from MI   GI Disease Sister    Stroke Neg Hx     Social  History   Tobacco Use   Smoking status: Former    Current packs/day: 0.00    Types: Cigarettes    Quit date: 08/29/1996    Years since quitting: 27.3    Passive exposure: Past   Smokeless tobacco: Never  Vaping Use   Vaping status: Never Used  Substance Use Topics   Alcohol use: Not Currently    Alcohol/week: 2.0 standard drinks of alcohol    Types: 2 Cans of beer per week    Comment: quit beginning of 2020   Drug use: Yes    Types: Cocaine     Review of Systems:    Constitutional: No weight loss, fever, chills, weakness or fatigue HEENT: Eyes: No change in vision Ears,  Nose, Throat:  No change in hearing or congestion Skin: No rash or itching Cardiovascular: No chest pain, chest pressure or palpitations   Respiratory: No SOB or cough Gastrointestinal: See HPI and otherwise negative Genitourinary: No dysuria or change in urinary frequency Neurological: No headache, dizziness or syncope Musculoskeletal: No new muscle or joint pain Hematologic: No bleeding or bruising    Physical Exam:  Vital signs: There were no vitals taken for this visit.  Constitutional: NAD, Well developed, Well nourished, alert and cooperative Head:  Normocephalic and atraumatic.  Eyes: No scleral icterus. Conjunctiva pink. Mouth: No oral lesions. Respiratory: Respirations even and unlabored. Lungs clear to auscultation bilaterally.  No wheezes, crackles, or rhonchi.  Cardiovascular:  Regular rate and rhythm. No murmurs. No peripheral edema. Gastrointestinal:  Soft, nondistended, nontender. No rebound or guarding. Normal bowel sounds. No appreciable masses or hepatomegaly. Rectal:  Not performed.  Msk:  Symmetrical without gross deformities. Without edema, no deformity or joint abnormality.  Neurologic:  Alert and oriented x4;  grossly normal neurologically.  Skin:   Dry and intact without significant lesions or rashes. Psychiatric: Oriented to person, place and time. Demonstrates good judgement  and reason without abnormal affect or behaviors.   RELEVANT LABS AND IMAGING: CBC    Component Value Date/Time   WBC 7.9 11/21/2023 1322   RBC 4.41 11/21/2023 1322   HGB 14.4 11/21/2023 1322   HGB 14.6 05/03/2023 1454   HCT 42.6 11/21/2023 1322   HCT 43.9 05/03/2023 1454   HCT 45 10/10/2011 0000   PLT 348.0 11/21/2023 1322   PLT 364 05/03/2023 1454   MCV 96.6 11/21/2023 1322   MCV 97 05/03/2023 1454   MCV 95 11/24/2014 1048   MCH 31.7 08/08/2023 1403   MCHC 33.8 11/21/2023 1322   RDW 14.1 11/21/2023 1322   RDW 12.7 05/03/2023 1454   RDW 13.2 11/24/2014 1048   LYMPHSABS 2.9 11/21/2023 1322   LYMPHSABS 2.0 11/24/2014 1048   MONOABS 0.7 11/21/2023 1322   MONOABS 0.7 11/24/2014 1048   EOSABS 0.7 11/21/2023 1322   EOSABS 0.2 11/24/2014 1048   BASOSABS 0.1 11/21/2023 1322   BASOSABS 0.1 11/24/2014 1048    CMP     Component Value Date/Time   NA 134 (L) 08/08/2023 1403   NA 139 05/03/2023 1454   NA 136 11/24/2014 1048   K 3.8 08/08/2023 1403   K 4.5 11/24/2014 1048   K 4.8 10/10/2011 0000   CL 101 08/08/2023 1403   CL 101 11/24/2014 1048   CO2 24 08/08/2023 1403   CO2 27 11/24/2014 1048   GLUCOSE 88 08/08/2023 1403   GLUCOSE 95 11/24/2014 1048   BUN 14 08/08/2023 1403   BUN 13 05/03/2023 1454   BUN 17 11/24/2014 1048   CREATININE 0.92 08/08/2023 1403   CREATININE 0.93 11/24/2014 1048   CREATININE 1.20 10/10/2011 0000   CALCIUM 8.6 (L) 08/08/2023 1403   CALCIUM 9.2 11/24/2014 1048   CALCIUM 9.2 10/10/2011 0000   PROT 7.2 03/08/2023 0906   PROT 7.4 09/25/2014 1731   ALBUMIN 4.2 03/08/2023 0906   ALBUMIN 3.2 (L) 09/25/2014 1731   AST 28 03/08/2023 0906   AST 27 09/25/2014 1731   AST 21 10/10/2011 0000   ALT 30 03/08/2023 0906   ALT 31 09/25/2014 1731   ALKPHOS 49 03/08/2023 0906   ALKPHOS 55 09/25/2014 1731   ALKPHOS 45 10/10/2011 0000   BILITOT 0.6 03/08/2023 0906   BILITOT 0.2 09/25/2014 1731   BILITOT 0.6 10/10/2011 0000  GFRNONAA >60 08/08/2023 1403    GFRNONAA >60 11/24/2014 1048   GFRAA 84 12/13/2019 1549   GFRAA >60 11/24/2014 1048   Echocardiogram 04/13/2023 1. Left ventricular ejection fraction, by estimation, is 60 to 65% . The left ventricle has normal function. The left ventricle has no regional wall motion abnormalities. Left ventricular diastolic parameters are indeterminate.  2. Right ventricular systolic function is normal. The right ventricular size is normal. Tricuspid regurgitation signal is inadequate for assessing PA pressure.  3. Left atrial size was moderately dilated.  4. The mitral valve is normal in structure. Mild mitral valve regurgitation. No evidence of mitral stenosis.  5. The aortic valve is tricuspid. Aortic valve regurgitation is not visualized. No aortic stenosis is present.  6. The inferior vena cava is normal in size with greater than 50% respiratory variability, suggesting right atrial pressure of 3 mmHg.  Assessment/Plan:   - For persistent anal fissure, likely needs colonoscopy and in the meantime can continue topical tx - Any procedures need to be done at hospital d/t hx difficult intubation - Probably repeat a CBC due to continued bleeding, CMP, add on iron studies if anemia - If atypical fissure not at midline, workup for Crohn's  Valiant Gaul, PA-C [facility] 01/07/2024, 5:45 PM  Patient Care Team: Claire Crick, MD as PCP - General (Family Medicine) Devorah Fonder, MD as PCP - Cardiology (Cardiology) Boyce Byes, MD as PCP - Electrophysiology (Cardiology) Devorah Fonder, MD as Consulting Physician (Cardiology)

## 2024-01-08 ENCOUNTER — Ambulatory Visit: Admitting: Gastroenterology

## 2024-02-02 ENCOUNTER — Telehealth: Payer: Self-pay | Admitting: Cardiovascular Disease

## 2024-02-02 NOTE — Telephone Encounter (Signed)
   Patient Name: Stanley Kane  DOB: Jul 20, 1966 MRN: [id number]  Primary Cardiologist: Belva Boyden, MD  Clinical pharmacists have reviewed the patient's past medical history, labs, and current medications as part of preoperative protocol coverage. The following recommendations have been made:  Patient with diagnosis of A Fib on Xarelto  for anticoagulation.       Procedure: Rt open Carpel Tunnel  Date of procedure: 02/19/24   CHA2DS2-VASc Score = 1  This indicates a 0.6% annual risk of stroke. The patient's score is based upon: CHF History: 1 HTN History: 0 Diabetes History: 0 Stroke History: 0 Vascular Disease History: 0 Age Score: 0 Gender Score: 0   CrCl 109 ml/min using adj body weight Platelet count 348K   Per office protocol, patient can hold Xarelto  for 2 days prior to procedure.  Please resume Xarelto  as soon as possible postprocedure, at the discretion of the surgeon.    I will route this recommendation to the requesting party via Epic fax function and remove from pre-op pool.  Please call with questions.  Jude Norton, NP 02/02/2024, 3:32 PM

## 2024-02-02 NOTE — Telephone Encounter (Signed)
 Patient with diagnosis of A Fib on Xarelto  for anticoagulation.     Procedure: Rt open Carpel Tunnel  Date of procedure: 02/19/24  CHA2DS2-VASc Score = 1  This indicates a 0.6% annual risk of stroke. The patient's score is based upon: CHF History: 1 HTN History: 0 Diabetes History: 0 Stroke History: 0 Vascular Disease History: 0 Age Score: 0 Gender Score: 0     CrCl 109 ml/min using adj body weight Platelet count 348K  Per office protocol, patient can hold Xarelto  for 2 days prior to procedure.    **This guidance is not considered finalized until pre-operative APP has relayed final recommendations.**

## 2024-02-02 NOTE — Telephone Encounter (Signed)
   Pre-operative Risk Assessment    Patient Name: Stanley Kane  DOB: May 17, 1966 MRN: [id number]   Date of last office visit: 11/13/23 Date of next office visit: n/a   Request for Surgical Clearance    Procedure:  Rt open Carpel Tunnel  Date of Surgery:  Clearance 02/19/24                                Surgeon:  Dr. Rande Bushy Surgeon's Group or Practice Name:  [facility] Phone number:  [phone] Fax number:  [phone]   Type of Clearance Requested:   - Pharmacy:  Hold Rivaroxaban  (Xarelto ) stop prior to surgery   Type of Anesthesia:  Not Indicated   Additional requests/questions:    Signed, Fletcher Humble   02/02/2024, 2:12 PM

## 2024-02-13 ENCOUNTER — Ambulatory Visit: Admitting: Gastroenterology

## 2024-02-13 ENCOUNTER — Encounter: Payer: Self-pay | Admitting: Gastroenterology

## 2024-02-13 ENCOUNTER — Telehealth: Payer: Self-pay | Admitting: Gastroenterology

## 2024-02-13 VITALS — BP 106/66 | HR 119 | Ht 68.0 in | Wt 253.4 lb

## 2024-02-13 DIAGNOSIS — K602 Anal fissure, unspecified: Secondary | ICD-10-CM

## 2024-02-13 DIAGNOSIS — K625 Hemorrhage of anus and rectum: Secondary | ICD-10-CM

## 2024-02-13 DIAGNOSIS — K5900 Constipation, unspecified: Secondary | ICD-10-CM

## 2024-02-13 DIAGNOSIS — K219 Gastro-esophageal reflux disease without esophagitis: Secondary | ICD-10-CM | POA: Diagnosis not present

## 2024-02-13 DIAGNOSIS — R21 Rash and other nonspecific skin eruption: Secondary | ICD-10-CM

## 2024-02-13 DIAGNOSIS — R09A2 Foreign body sensation, throat: Secondary | ICD-10-CM

## 2024-02-13 DIAGNOSIS — K6289 Other specified diseases of anus and rectum: Secondary | ICD-10-CM | POA: Diagnosis not present

## 2024-02-13 MED ORDER — PANTOPRAZOLE SODIUM 40 MG PO TBEC: 40.0000 mg | DELAYED_RELEASE_TABLET | Freq: Two times a day (BID) | ORAL | 3 refills | Status: AC

## 2024-02-13 MED ORDER — AMBULATORY NON FORMULARY MEDICATION: 0 refills | Status: AC

## 2024-02-13 NOTE — Patient Instructions (Addendum)
 We have sent a prescription for Diltiazem  2% gel to [facility] for you. Using your index finger, you should apply a small amount of medication inside the rectum up to your first knuckle/joint twice daily x 4 weeks.  [facility]'s information is below: Address: [address] [place], [place] [address]  [phone]  *Please DO NOT go directly from our office to pick up this medication! Give the pharmacy 1 day to process the prescription as this is compounded and takes time to make.   Thank you for trusting me with your gastrointestinal care!   Valiant Gaul, PA-C   _______________________________________________________  If your blood pressure at your visit was 140/90 or greater, please contact your primary care physician to follow up on this.  _______________________________________________________  If you are age 45 or older, your body mass index should be between 23-30. Your Body mass index is 38.53 kg/m. If this is out of the aforementioned range listed, please consider follow up with your Primary Care Provider.  If you are age 51 or younger, your body mass index should be between 19-25. Your Body mass index is 38.53 kg/m. If this is out of the aformentioned range listed, please consider follow up with your Primary Care Provider.   ________________________________________________________  The [facility] providers would like to encourage you to use MYCHART to communicate with providers for non-urgent requests or questions.  Due to long hold times on the telephone, sending your provider a message by [facility] may be a faster and more efficient way to get a response.  Please allow 48 business hours for a response.  Please remember that this is for non-urgent requests.  _______________________________________________________

## 2024-02-13 NOTE — Progress Notes (Signed)
 Stanley Kane [id number] [birth year]   Chief Complaint: Rectal bleeding  Referring Provider: Claire Crick, MD Primary GI MD: Para Bold  HPI: Stanley Kane is a 58 y.o. male, new patient, with past medical history of cholelithiasis, CHF (LVEF 60-65% on echo 03/2023), history of difficult intubation, fatty liver, kidney stones, nonischemic cardiomyopathy, OSA, persistent atrial fibrillation (on Xarelto , BB, digoxin ), anal fissure (in his 58s) who presents today for a complaint of rectal bleeding.     09/05/2016 seen by GI Dr. Ole Berkeley in [facility] for hemorrhoidal bleeding and RUQ abdominal pain. Exam was consistent with musculoskeletal pain. Hemorrhoids were treated with Anusol  suppositories. He was scheduled for screening colonoscopy which he later cancelled.  Patient seen by PCP 11/21/2023 for complaint of BRBPR.  Fissure was suspected based on history and exam.  No recent colonoscopy but patient did have a normal Cologuard 05/2023.  CBC with eosinophilia with normal WBC, otherwise normal with hemoglobin of 14.4.  Nifedipine  0.3% compounded ointment was sent to pharmacy.  Per telephone encounter, this helped some but patient continued to have bleeding with bowel movements.   Today patient states he has continued to see blood in the stool or in the toilet after most bowel movements.  The amount of blood varies.  He has associated rectal pain after bowel movements which is relieved with OTC pain relievers.  States he used the topical nifedipine  cream intermittently for 3 to 4 weeks.  He was not applying it consistently.  Did not feel like it improved his symptoms.  MiraLAX has helped in reducing constipation and he is now having soft bowel movements daily.  He denies any diarrhea.  He has occasional LLQ pain which does not seem to be related to bowel movements.  He states that he had an anal fissure when he was in his 14s.  This developed after he had made a significant diet change and had  intentionally lost a lot of weight.  He underwent a colonoscopy and states he later had surgery for the fissure.  His current rectal pain feels similar to previous fissure but not as severe.  Patient has heartburn and acid reflux which occurs about every other day.  He sometimes has regurgitation when he lays down at night.  States he has had intermittent acid reflux and heartburn for years, which has maybe become more frequent with weight gain in the last 3 to 4 years.  Has also noticed a sensation of something being in his throat when laying down at night or after eating but denies dysphagia.  He takes Tums as needed, not on any antireflux medications.   Previous GI Procedures/Imaging   CT renal stone study 02/20/2023 1. 3 x 4 mm stone in the proximal left ureter with mild hydroureteronephrosis. 2. Slight hyperdensity of the renal papillary regions bilaterally, which could indicate medullary sponge kidney. 3. Cholelithiasis without evidence of cholecystitis.   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)    a.) TTE 05/22/2012: EF 50-55%, no RWMAs, mild LAE, norm RVSF, mild MR; b.) TTE 09/26/2017: EF 60-65%, no RWMAs, mild LAE, norm RVSF, triv PR, mild MR/TR; c.) TTE 11/23/2017: EF 45-50%, no RWMAs, mild LAE, norm RVSF, triv MR/PR,  mild TR; d.) TTE 04/13/2023: EF 60-65%, no RWMAs, mod LAE, norm RVSF, mild MR   Depression    Difficult intubation    Fatty liver 06/2014   History of kidney stones    Kidney stone    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   a.) MRI chest 07/25/2023: 12.1 x 11.8 x 9.4 cm circumscribed mass to RIGHT chest and lower axilla; overlies oblique musculature and anterior latissimus dorsi    Lipoma of posterior chest wall 12/27/2021   S/p excision 07/2023- pathology: lipoma (8.5 x 4.5 x 3.6 cm well circumscribed) with fat necrosis      Long-term current use of immunosuppressive biologic agent    a.) secukinumab   NICM (nonischemic cardiomyopathy) (HCC)    a.) EF 05/22/2012: EF 50-55%; b.) TTE 09/26/2014: EF 60-65%; c.) MV 09/06/2017: EF 33-44%; d.) TTE 11/23/2017: EF 45-50%; e.) MV 01/15/2020: EF 45%; f.) MV 11/29/2021: EF 60%; g.) TTE 04/13/2023: EF 60-65%   Obesity    On rivaroxaban  therapy    OSA (obstructive sleep apnea)    a.) does not utilize nocturnal PAP therapy   Persistent atrial fibrillation (HCC) 2013   a.) Dx'd in 2013; b.) CHA2DS2-VASc = 1 (HFmrEF) as of 08/09/2023; b.) s/p DCCV 07/06/2012 (150 J x1); c.) s/p DCCV 11/25/2014 (150 J x 1); d.) cardiac rate/rhythm maintained on oral digoxin  + metoprolol  succinate; chronically anticoagulated using rivaroxaban    Plaque psoriasis    a.) Tx'd with secukinumab injections + topical triamcinolone    Prediabetes    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   hospitalization [facility] - stress with father's illness   Vitamin D  deficiency     Past Surgical History:  Procedure Laterality Date   CARDIOVASCULAR STRESS TEST  ~2005   thinks at [facility], told normal   carotid us   08/2014   no plaque   COLONOSCOPY  2000   WNL, for weight loss after started gym   EXTRACORPOREAL SHOCK WAVE LITHOTRIPSY Left 06/01/2023   Procedure: EXTRACORPOREAL SHOCK WAVE LITHOTRIPSY (ESWL);  Surgeon: Lawerence Pressman, MD;  Location: [facility] ORS;  Service: Urology;  Laterality: Left;   LIPOMA EXCISION Right 08/14/2023   Procedure: EXCISION LIPOMA, chest wall;  Surgeon: Barrett Lick, MD;  Location: [facility] ORS;  Service: General;  Laterality: Right;   MRI  08/2014   brain - WNL, skull base C1/2 articulation   TONSILLECTOMY AND ADENOIDECTOMY  childhood   US  ECHOCARDIOGRAPHY  08/2014   EF 60%, nl vent fxn, mildly dilated LA, mild MR, mild TR    Current Outpatient  Medications  Medication Sig Dispense Refill   DENTA 5000 PLUS 1.1 % CREA dental cream Place 1 Application onto teeth daily.     digoxin  (LANOXIN ) 0.125 MG tablet Take 1 tablet (0.125 mg total) by mouth daily. 90 tablet 3   escitalopram  (LEXAPRO ) 20 MG tablet Take 1 tablet (20 mg total) by mouth daily. 90 tablet 3   furosemide  (LASIX ) 20 MG tablet Take 1 tablet (20 mg total) by mouth as needed for edema. 90 tablet 3   metoprolol  succinate (TOPROL -XL) 25 MG 24 hr tablet Take 1 tablet (25 mg total) by mouth in the morning and at bedtime. 180 tablet 3   nifedipine  0.3 % ointment Place 1 Application rectally in the morning, at noon, and at bedtime. For 3 weeks 30 g 0   rivaroxaban  (XARELTO ) 20 MG TABS tablet Take 1 tablet (20 mg total) by mouth daily with supper. 90 tablet 1   Secukinumab, 300 MG Dose, (COSENTYX, 300  MG DOSE,) 150 MG/ML SOSY Inject 2 mLs (300 mg total) into the skin every 30 (thirty) days.     triamcinolone  cream (KENALOG ) 0.1 % APPLY TO AFFECTED AREA TWICE A DAY AS DIRECTED 80 g 0   Vitamin D , Ergocalciferol , 50000 units CAPS Take 5,000 Units by mouth once a week.     No current facility-administered medications for this visit.    Allergies as of 02/13/2024 - Review Complete 11/23/2023  Allergen Reaction Noted   Vicodin [hydrocodone-acetaminophen ]  04/09/2012    Family History  Problem Relation Age of Onset   Diabetes Mother    Heart disease Mother    Hypertension Father    Coronary artery disease Father 46   Heart attack Father    Cirrhosis Father    Hypercalcemia Father    CAD Sister 53       deceased from MI   GI Disease Sister    Stroke Neg Hx     Social History   Tobacco Use   Smoking status: Former    Current packs/day: 0.00    Types: Cigarettes    Quit date: 08/29/1996    Years since quitting: 27.4    Passive exposure: Past   Smokeless tobacco: Never  Vaping Use   Vaping status: Never Used  Substance Use Topics   Alcohol use: Not Currently     Alcohol/week: 2.0 standard drinks of alcohol    Types: 2 Cans of beer per week    Comment: quit beginning of 2020   Drug use: Yes    Types: Cocaine     Review of Systems:    Constitutional: No unexplained weight loss, fever, chills, weakness or fatigue Skin: No rash or itching Cardiovascular: No chest pain, chest pressure or palpitations   Respiratory: No SOB or cough Gastrointestinal: See HPI and otherwise negative Neurological: No headache, dizziness or syncope Musculoskeletal: No new muscle or joint pain Hematologic: No bruising    Physical Exam:  Vital signs: BP 106/66   Pulse (!) 119   Ht 5' 8 (1.727 m)   Wt 253 lb 6 oz (114.9 kg)   BMI 38.53 kg/m    Constitutional: Pleasant, obese male in NAD, alert and cooperative Head:  Normocephalic and atraumatic.  Eyes: No scleral icterus. Conjunctiva pink. Respiratory: Respirations even and unlabored. Lungs clear to auscultation bilaterally.  No wheezes, crackles, or rhonchi.  Cardiovascular:  Regular rate, irregularly irregular rhythm. No murmurs. No peripheral edema. Gastrointestinal:  Soft, nondistended, mildly tender to palpation of LUQ. No rebound or guarding. Normal bowel sounds. No appreciable masses or hepatomegaly. Rectal:  Nonbleeding anal fissure at left anterior midline which is tender on exam. Neurologic:  Alert and oriented x4;  grossly normal neurologically.  Skin:   Dry and intact without significant lesions or rashes. Psychiatric: Oriented to person, place and time. Demonstrates good judgement and reason without abnormal affect or behaviors.   RELEVANT LABS AND IMAGING: CBC    Component Value Date/Time   WBC 7.9 11/21/2023 1322   RBC 4.41 11/21/2023 1322   HGB 14.4 11/21/2023 1322   HGB 14.6 05/03/2023 1454   HCT 42.6 11/21/2023 1322   HCT 43.9 05/03/2023 1454   HCT 45 10/10/2011 0000   PLT 348.0 11/21/2023 1322   PLT 364 05/03/2023 1454   MCV 96.6 11/21/2023 1322   MCV 97 05/03/2023 1454   MCV 95  11/24/2014 1048   MCH 31.7 08/08/2023 1403   MCHC 33.8 11/21/2023 1322   RDW 14.1 11/21/2023 1322  RDW 12.7 05/03/2023 1454   RDW 13.2 11/24/2014 1048   LYMPHSABS 2.9 11/21/2023 1322   LYMPHSABS 2.0 11/24/2014 1048   MONOABS 0.7 11/21/2023 1322   MONOABS 0.7 11/24/2014 1048   EOSABS 0.7 11/21/2023 1322   EOSABS 0.2 11/24/2014 1048   BASOSABS 0.1 11/21/2023 1322   BASOSABS 0.1 11/24/2014 1048    CMP     Component Value Date/Time   NA 134 (L) 08/08/2023 1403   NA 139 05/03/2023 1454   NA 136 11/24/2014 1048   K 3.8 08/08/2023 1403   K 4.5 11/24/2014 1048   K 4.8 10/10/2011 0000   CL 101 08/08/2023 1403   CL 101 11/24/2014 1048   CO2 24 08/08/2023 1403   CO2 27 11/24/2014 1048   GLUCOSE 88 08/08/2023 1403   GLUCOSE 95 11/24/2014 1048   BUN 14 08/08/2023 1403   BUN 13 05/03/2023 1454   BUN 17 11/24/2014 1048   CREATININE 0.92 08/08/2023 1403   CREATININE 0.93 11/24/2014 1048   CREATININE 1.20 10/10/2011 0000   CALCIUM 8.6 (L) 08/08/2023 1403   CALCIUM 9.2 11/24/2014 1048   CALCIUM 9.2 10/10/2011 0000   PROT 7.2 03/08/2023 0906   PROT 7.4 09/25/2014 1731   ALBUMIN 4.2 03/08/2023 0906   ALBUMIN 3.2 (L) 09/25/2014 1731   AST 28 03/08/2023 0906   AST 27 09/25/2014 1731   AST 21 10/10/2011 0000   ALT 30 03/08/2023 0906   ALT 31 09/25/2014 1731   ALKPHOS 49 03/08/2023 0906   ALKPHOS 55 09/25/2014 1731   ALKPHOS 45 10/10/2011 0000   BILITOT 0.6 03/08/2023 0906   BILITOT 0.2 09/25/2014 1731   BILITOT 0.6 10/10/2011 0000   GFRNONAA >60 08/08/2023 1403   GFRNONAA >60 11/24/2014 1048   GFRAA 84 12/13/2019 1549   GFRAA >60 11/24/2014 1048   Echocardiogram 04/13/2023 1. Left ventricular ejection fraction, by estimation, is 60 to 65% . The left ventricle has normal function. The left ventricle has no regional wall motion abnormalities. Left ventricular diastolic parameters are indeterminate.  2. Right ventricular systolic function is normal. The right ventricular size is  normal. Tricuspid regurgitation signal is inadequate for assessing PA pressure.  3. Left atrial size was moderately dilated.  4. The mitral valve is normal in structure. Mild mitral valve regurgitation. No evidence of mitral stenosis.  5. The aortic valve is tricuspid. Aortic valve regurgitation is not visualized. No aortic stenosis is present.  6. The inferior vena cava is normal in size with greater than 50% respiratory variability, suggesting right atrial pressure of 3 mmHg.  Assessment/Plan:   Rectal bleeding Anal fissure Constipation  Patient with persistent rectal pain and noticing bleeding with bowel movements.  Was treated for an anal fissure by PCP in March, but he did not use topical nifedipine  consistently, and discontinued use after 3 to 4 weeks.  Anal fissure seen on exam today.  Had negative Cologuard 05/2023. Prior colonoscopy 25 years ago for similar symptoms, and patient reports having surgery for an anal fissure at that time. Constipation has improved on MiraLAX.  - Will treat fissure with diltiazem  2%/lidocaine  5% for 8 weeks. If no improvement in rectal pain and bleeding at follow up will schedule colonoscopy. - Continue Miralax one capful daily. - Repeat CBC today   GERD Globus sensation Reports intermittent heartburn and acid reflux ongoing for years, but possibly worsening over the last 3 to 4 years.  He is taking Tums as needed.  Having symptoms about every other day.  Also reports globus sensation occurring when he lays down at night and after meals.  - Start Protonix 40 mg twice daily.  If planning for colonoscopy in future, or if symptoms persist despite PPI, will plan for EGD. - Lifestyle modifications for GERD.   *Note that patient has difficult intubation listed in PMH, though he denies any knowledge of this and there was no mention of anesthesia complication during recent surgery in December. Procedure may need to be done in hospital setting.   Valiant Gaul, PA-C [facility] 02/13/2024, 9:00 AM  Patient Care Team: Claire Crick, MD as PCP - General (Family Medicine) Devorah Fonder, MD as PCP - Cardiology (Cardiology) Boyce Byes, MD as PCP - Electrophysiology (Cardiology) Devorah Fonder, MD as Consulting Physician (Cardiology)

## 2024-02-13 NOTE — Telephone Encounter (Signed)
 Patient requesting for medication from today to be sent to pharmacy so he is able to pick it up so he does not have to come to town tomorrow. Please advise, thank you

## 2024-02-14 NOTE — Telephone Encounter (Signed)
 I spoke to [place] and he advised that he was able to pick up the compounded medication before he left [facility] yesterday.

## 2024-02-17 NOTE — Progress Notes (Signed)
 Attending Physician's Attestation   I have reviewed the chart.   I agree with the Advanced Practitioner's note, impression, and recommendations with any updates as below. Agree with aggressive treatment for fissure for at least next 3 to 6 weeks with consistent use. With persisting symptoms colonoscopy makes sense. Will place anesthesia for LEC on here to review chart, but if listed as a difficult intubation, then this procedure will likely need to be pursued in the hospital-based outpatient setting. Will have my nurse begin looking for dates for outpatient colonoscopy and once we hear back from anesthesia confirm where the procedure can be done.   Aloha Finner, MD [facility] Advanced Endoscopy Office # [phone]

## 2024-02-19 ENCOUNTER — Telehealth: Payer: Self-pay

## 2024-02-19 DIAGNOSIS — M65321 Trigger finger, right index finger: Secondary | ICD-10-CM | POA: Diagnosis not present

## 2024-02-19 DIAGNOSIS — G5601 Carpal tunnel syndrome, right upper limb: Secondary | ICD-10-CM | POA: Diagnosis not present

## 2024-02-19 NOTE — Telephone Encounter (Signed)
-----   Message from [facility] sent at 02/19/2024  3:13 PM EDT ----- Regarding: RE: Difficult Intubation Jan, Thank you for update.  Damauri Minion, Please schedule colonoscopy in hospital-based setting with me next available. Thanks. GM ----- Message ----- From: Dyan Rush, CRNA Sent: 02/19/2024   8:35 AM EDT To: Camie FORBES Furbish, PA-C; Odetta LITTIE Curly, RN; Gabr# Subject: Difficult Intubation                           Dr. Wilhelmenia,   This pt is a documented difficult intubation and his procedure will need to be done at the hospital.   Regards,  Rush EMERSON Dyan ----- Message ----- From: Wilhelmenia Aloha Raddle., MD Sent: 02/17/2024   6:52 AM EDT To: Rush Dyan, CRNA; Camie FORBES Furbish, PA-C; [place] #

## 2024-02-19 NOTE — Telephone Encounter (Signed)
 Author: Mansouraty, Aloha Raddle., MD Service: Gastroenterology Author Type: Physician  Filed: 02/17/2024  6:52 AM Encounter Date: 02/13/2024 Status: Signed  Editor: Mansouraty, Aloha Raddle., MD (Physician)    Attending Physician's Attestation    I have reviewed the chart.    I agree with the Advanced Practitioner's note, impression, and recommendations with any updates as below. Agree with aggressive treatment for fissure for at least next 3 to 6 weeks with consistent use. With persisting symptoms colonoscopy makes sense. Will place anesthesia for LEC on here to review chart, but if listed as a difficult intubation, then this procedure will likely need to be pursued in the hospital-based outpatient setting. Will have my nurse begin looking for dates for outpatient colonoscopy and once we hear back from anesthesia confirm where the procedure can be done.     Aloha Finner, MD [facility] Advanced Endoscopy Office # [phone]

## 2024-02-19 NOTE — Telephone Encounter (Signed)
-----   Message from NULTY,JOHN sent at 02/19/2024  8:34 AM EDT ----- Regarding: Difficult Intubation Dr. Wilhelmenia,   This pt is a documented difficult intubation and his procedure will need to be done at the hospital.   Regards,  Norleen EMERSON Schillings ----- Message ----- From: Wilhelmenia Aloha Raddle., MD Sent: 02/17/2024   6:52 AM EDT To: Norleen Schillings, CRNA; Camie FORBES Furbish, PA-C; [place] #

## 2024-02-21 ENCOUNTER — Telehealth: Payer: Self-pay

## 2024-02-21 DIAGNOSIS — I4821 Permanent atrial fibrillation: Secondary | ICD-10-CM

## 2024-02-21 DIAGNOSIS — I5022 Chronic systolic (congestive) heart failure: Secondary | ICD-10-CM

## 2024-02-21 DIAGNOSIS — I420 Dilated cardiomyopathy: Secondary | ICD-10-CM

## 2024-02-21 DIAGNOSIS — E785 Hyperlipidemia, unspecified: Secondary | ICD-10-CM

## 2024-02-21 DIAGNOSIS — G4733 Obstructive sleep apnea (adult) (pediatric): Secondary | ICD-10-CM

## 2024-02-21 NOTE — Addendum Note (Signed)
 Addended by: RILLA BALLER on: 02/21/2024 04:50 PM   Modules accepted: Orders

## 2024-02-21 NOTE — Telephone Encounter (Addendum)
 New referral placed per pt request for second opinion to [facility] cardiology.

## 2024-02-21 NOTE — Telephone Encounter (Signed)
 Copied from CRM [id number]. Topic: Referral - Request for Referral >> Feb 21, 2024 12:32 PM Burnard DEL wrote: Did the patient discuss referral with their provider in the last year? Yes (If No - schedule appointment) (If Yes - send message)  Appointment offered? No  Type of order/referral and detailed reason for visit: Cardiologist/ second opinion  Preference of office, provider, location: [facility]  [address] If referral order, have you been seen by this specialty before? No (If Yes, this issue or another issue? When? Where?  Can we respond through MyChart? No

## 2024-02-22 ENCOUNTER — Other Ambulatory Visit: Payer: Self-pay

## 2024-02-22 DIAGNOSIS — K625 Hemorrhage of anus and rectum: Secondary | ICD-10-CM

## 2024-02-22 DIAGNOSIS — K5900 Constipation, unspecified: Secondary | ICD-10-CM

## 2024-02-22 DIAGNOSIS — K602 Anal fissure, unspecified: Secondary | ICD-10-CM

## 2024-02-22 MED ORDER — NA SULFATE-K SULFATE-MG SULF 17.5-3.13-1.6 GM/177ML PO SOLN
1.0000 | Freq: Once | ORAL | 0 refills | Status: AC
Start: 2024-02-22 — End: 2024-02-22

## 2024-02-22 NOTE — Telephone Encounter (Signed)
 Colon has been set up for 05/02/24 at 1215 pm at [facility] with GM   Follow up with Camie on 03/27/24 at 1110 am  Pt on Xarelto 

## 2024-02-23 ENCOUNTER — Telehealth: Payer: Self-pay

## 2024-02-23 NOTE — Telephone Encounter (Signed)
 Pharmacy please advise on holding Xarelto  prior to EDG scheduled for 05/02/2024. Thank you.

## 2024-02-23 NOTE — Telephone Encounter (Signed)
 [facility] Pre-operative Risk Assessment     Request for surgical clearance:     Endoscopy Procedure  What type of surgery is being performed?     ERCP   When is this surgery scheduled?     05/02/24  What type of clearance is required ?   Pharmacy  Are there any medications that need to be held prior to surgery and how long? Xarelto   Practice name and name of physician performing surgery?      [facility]  What is your office phone and fax number?      Phone- [phone]  Fax- [phone]  Anesthesia type (None, local, MAC, general) ?       MAC   Please route your response to Odetta Curly RN

## 2024-02-23 NOTE — Telephone Encounter (Signed)
 colon scheduled, pt instructed and medications reviewed.  Patient instructions mailed to home.  Patient to call with any questions or concerns.  Pt aware of the appt with Camie  Dr Perla sent a letter to stop xarelto .

## 2024-02-26 NOTE — Telephone Encounter (Signed)
 Patient with diagnosis of A Fib on Xarelto  for anticoagulation.    Procedure:  ERCP  Date of procedure: 05/02/24   CHA2DS2-VASc Score = 1  This indicates a 0.6% annual risk of stroke. The patient's score is based upon: CHF History: 1 HTN History: 0 Diabetes History: 0 Stroke History: 0 Vascular Disease History: 0 Age Score: 0 Gender Score: 0       CrCl 144 ml/min Platelet count 348K   Per office protocol, patient can hold Xarelto  for 2 days prior to procedure.    **This guidance is not considered finalized until pre-operative APP has relayed final recommendations.**

## 2024-02-26 NOTE — Telephone Encounter (Signed)
 The patient has been notified of this information and all questions answered.

## 2024-02-26 NOTE — Telephone Encounter (Signed)
   Patient Name: Stanley Kane  DOB: 08-17-1966 MRN: [id number]  Primary Cardiologist: Evalene Lunger, MD  Clinical pharmacists have reviewed the patient's past medical history, labs, and current medications as part of preoperative protocol coverage. The following recommendations have been made:  Per office protocol, patient can hold Xarelto  for 2 days prior to procedure.    I will route this recommendation to the requesting party via Epic fax function and remove from pre-op pool.  Please call with questions.  Wyn Raddle, Jackee Shove, NP 02/26/2024, 8:50 AM

## 2024-03-04 DIAGNOSIS — L2089 Other atopic dermatitis: Secondary | ICD-10-CM | POA: Diagnosis not present

## 2024-03-04 DIAGNOSIS — L400 Psoriasis vulgaris: Secondary | ICD-10-CM | POA: Diagnosis not present

## 2024-03-04 DIAGNOSIS — Z79899 Other long term (current) drug therapy: Secondary | ICD-10-CM | POA: Diagnosis not present

## 2024-03-04 DIAGNOSIS — D492 Neoplasm of unspecified behavior of bone, soft tissue, and skin: Secondary | ICD-10-CM | POA: Diagnosis not present

## 2024-03-04 DIAGNOSIS — L738 Other specified follicular disorders: Secondary | ICD-10-CM | POA: Diagnosis not present

## 2024-03-11 DIAGNOSIS — S63694A Other sprain of right ring finger, initial encounter: Secondary | ICD-10-CM | POA: Diagnosis not present

## 2024-03-11 DIAGNOSIS — S63695A Other sprain of left ring finger, initial encounter: Secondary | ICD-10-CM | POA: Diagnosis not present

## 2024-03-11 DIAGNOSIS — G5603 Carpal tunnel syndrome, bilateral upper limbs: Secondary | ICD-10-CM | POA: Diagnosis not present

## 2024-03-12 DIAGNOSIS — I509 Heart failure, unspecified: Secondary | ICD-10-CM | POA: Diagnosis not present

## 2024-03-12 DIAGNOSIS — I4891 Unspecified atrial fibrillation: Secondary | ICD-10-CM | POA: Diagnosis not present

## 2024-03-25 ENCOUNTER — Telehealth: Payer: Self-pay | Admitting: Gastroenterology

## 2024-03-25 NOTE — Telephone Encounter (Signed)
 Inbound call from patient stating he is unable to make 7/30 appointment due to lack of transportation. Patient is scheduled for colonoscopy on 9/4, next available appointment is 9/9. Patient requesting to know if he needs to be seen prior to the colonoscopy. Requesting a call back. Please advise, thank you

## 2024-03-26 NOTE — Telephone Encounter (Signed)
 The pt aware to keep his appt for procedure as planned.

## 2024-03-27 ENCOUNTER — Ambulatory Visit: Admitting: Gastroenterology

## 2024-03-27 NOTE — Progress Notes (Signed)
 This encounter was created in error - please disregard.

## 2024-03-27 NOTE — Addendum Note (Signed)
 Addended by: FRANCHESTER MIKEY DEL on: 03/27/2024 09:13 AM   Modules accepted: Orders, Level of Service

## 2024-04-04 ENCOUNTER — Telehealth: Payer: Self-pay | Admitting: Cardiovascular Disease

## 2024-04-04 NOTE — Telephone Encounter (Signed)
   Pre-operative Risk Assessment    Patient Name: Stanley Kane  DOB: [birth year] MRN: [id number]   Date of last office visit: 11/13/23 Date of next office visit: n/a   Request for Surgical Clearance    Procedure:  left open carpal tunnel release   Date of Surgery:  Clearance 04/08/24                                Surgeon:  Dr Marchia Surgeon's Group or Practice Name:  [facility] Phone number:  [phone] Fax number:  [phone]   Type of Clearance Requested:   - Pharmacy:  Hold Rivaroxaban  (Xarelto ) hold 3 days   Type of Anesthesia:  Not Indicated   Additional requests/questions:    Bonney Rosina Stamps   04/04/2024, 8:04 AM

## 2024-04-05 NOTE — Telephone Encounter (Signed)
   Name: Stanley Kane  DOB: [birth year]  MRN: [id number]   Primary Cardiologist: Evalene Lunger, MD  Chart reviewed as part of pre-operative protocol coverage. Patient was contacted 04/05/2024 in reference to pre-operative risk assessment for pending surgery as outlined below.  Camran Keady Stensland was last seen on 11/13/2023 by Dr. Gollan.  Since that day, RAFFI MILSTEIN has done well but does still have some SOB. It has been stable since his appointment in March. No chest pains.  He does remain physically active and is cleaning his car out as we speak on the phone.  He also is able to walk short distances.  He does meet 4 METS on the DASI.  Comorbidities include permanent atrial fibrillation diagnosed in 2013 status post DCCV 07/06/2012, obesity with possible OSA, anxiety, and prior medication noncompliance.   Therefore, based on ACC/AHA guidelines, the patient would be at acceptable risk for the planned procedure without further cardiovascular testing.    CHA2DS2-VASc Score = 1  This indicates a 0.6% annual risk of stroke. The patient's score is based upon: CHF History: 1 HTN History: 0 Diabetes History: 0 Stroke History: 0 Vascular Disease History: 0 Age Score: 0 Gender Score: 0   CrCl 109 ml/min using adj body weight Platelet count 348K   Per office protocol, patient can hold Xarelto  for 2 days prior to procedure.  Please resume Xarelto  as soon as possible postprocedure, at the discretion of the surgeon.   The patient was advised that if he develops new symptoms prior to surgery to contact our office to arrange for a follow-up visit, and he verbalized understanding.  I will route this recommendation to the requesting party via Epic fax function and remove from pre-op pool. Please call with questions.  Orren LOISE Fabry, PA-C 04/05/2024, 1:26 PM

## 2024-04-05 NOTE — Telephone Encounter (Signed)
 Leita from [facility] calling to f/u. Surgery is on Monday.

## 2024-04-08 DIAGNOSIS — G5602 Carpal tunnel syndrome, left upper limb: Secondary | ICD-10-CM | POA: Diagnosis not present

## 2024-04-24 ENCOUNTER — Telehealth: Payer: Self-pay | Admitting: Gastroenterology

## 2024-04-24 NOTE — Telephone Encounter (Addendum)
 Procedure:Colonoscopy Procedure date: 05/02/24 Procedure location: [facility] Arrival Time: 10:45 am Spoke with the patient Y/N: Yes Any prep concerns? No  Has the patient obtained the prep from the pharmacy ? Yes Do you have a care partner and transportation: Yes Any additional concerns? No

## 2024-04-25 ENCOUNTER — Encounter (HOSPITAL_COMMUNITY): Payer: Self-pay | Admitting: Gastroenterology

## 2024-05-02 ENCOUNTER — Ambulatory Visit (HOSPITAL_COMMUNITY): Admitting: Anesthesiology

## 2024-05-02 ENCOUNTER — Ambulatory Visit (HOSPITAL_COMMUNITY)
Admission: RE | Admit: 2024-05-02 | Discharge: 2024-05-02 | Disposition: A | Discharge status: Home / Self Care | Attending: Gastroenterology | Admitting: Gastroenterology

## 2024-05-02 ENCOUNTER — Encounter (HOSPITAL_COMMUNITY)
Admission: RE | Disposition: A | Discharge status: Home / Self Care | Payer: Self-pay | Source: Home / Self Care | Attending: Gastroenterology

## 2024-05-02 ENCOUNTER — Other Ambulatory Visit: Payer: Self-pay

## 2024-05-02 ENCOUNTER — Encounter (HOSPITAL_COMMUNITY): Payer: Self-pay | Admitting: Gastroenterology

## 2024-05-02 DIAGNOSIS — F418 Other specified anxiety disorders: Secondary | ICD-10-CM | POA: Diagnosis not present

## 2024-05-02 DIAGNOSIS — K644 Residual hemorrhoidal skin tags: Secondary | ICD-10-CM | POA: Insufficient documentation

## 2024-05-02 DIAGNOSIS — D759 Disease of blood and blood-forming organs, unspecified: Secondary | ICD-10-CM | POA: Diagnosis not present

## 2024-05-02 DIAGNOSIS — K5900 Constipation, unspecified: Secondary | ICD-10-CM

## 2024-05-02 DIAGNOSIS — D127 Benign neoplasm of rectosigmoid junction: Secondary | ICD-10-CM

## 2024-05-02 DIAGNOSIS — K625 Hemorrhage of anus and rectum: Secondary | ICD-10-CM | POA: Diagnosis not present

## 2024-05-02 DIAGNOSIS — G4733 Obstructive sleep apnea (adult) (pediatric): Secondary | ICD-10-CM | POA: Insufficient documentation

## 2024-05-02 DIAGNOSIS — K621 Rectal polyp: Secondary | ICD-10-CM | POA: Diagnosis not present

## 2024-05-02 DIAGNOSIS — K635 Polyp of colon: Secondary | ICD-10-CM | POA: Insufficient documentation

## 2024-05-02 DIAGNOSIS — K602 Anal fissure, unspecified: Secondary | ICD-10-CM

## 2024-05-02 DIAGNOSIS — D128 Benign neoplasm of rectum: Secondary | ICD-10-CM | POA: Diagnosis not present

## 2024-05-02 DIAGNOSIS — I4891 Unspecified atrial fibrillation: Secondary | ICD-10-CM | POA: Diagnosis not present

## 2024-05-02 DIAGNOSIS — F419 Anxiety disorder, unspecified: Secondary | ICD-10-CM | POA: Insufficient documentation

## 2024-05-02 DIAGNOSIS — F32A Depression, unspecified: Secondary | ICD-10-CM | POA: Diagnosis not present

## 2024-05-02 DIAGNOSIS — K219 Gastro-esophageal reflux disease without esophagitis: Secondary | ICD-10-CM | POA: Diagnosis not present

## 2024-05-02 DIAGNOSIS — Z1211 Encounter for screening for malignant neoplasm of colon: Secondary | ICD-10-CM | POA: Insufficient documentation

## 2024-05-02 DIAGNOSIS — K642 Third degree hemorrhoids: Secondary | ICD-10-CM | POA: Insufficient documentation

## 2024-05-02 DIAGNOSIS — Z87891 Personal history of nicotine dependence: Secondary | ICD-10-CM | POA: Diagnosis not present

## 2024-05-02 DIAGNOSIS — I10 Essential (primary) hypertension: Secondary | ICD-10-CM | POA: Insufficient documentation

## 2024-05-02 DIAGNOSIS — Z860100 Personal history of colon polyps, unspecified: Secondary | ICD-10-CM

## 2024-05-02 DIAGNOSIS — R109 Unspecified abdominal pain: Secondary | ICD-10-CM | POA: Diagnosis present

## 2024-05-02 HISTORY — PX: COLONOSCOPY: SHX5424

## 2024-05-02 SURGERY — COLONOSCOPY
Anesthesia: Monitor Anesthesia Care

## 2024-05-02 MED ORDER — PHENYLEPHRINE HCL (PRESSORS) 10 MG/ML IV SOLN: INTRAVENOUS | Status: DC | PRN

## 2024-05-02 MED ORDER — PROPOFOL 1000 MG/100ML IV EMUL
INTRAVENOUS | Status: AC
  Filled 2024-05-02: qty 100

## 2024-05-02 MED ORDER — METOPROLOL SUCCINATE ER 25 MG PO TB24
25.0000 mg | ORAL_TABLET | ORAL | Status: AC
  Administered 2024-05-02: 25 mg via ORAL
  Filled 2024-05-02: qty 1

## 2024-05-02 MED ORDER — SODIUM CHLORIDE 0.9 % IV SOLN: INTRAVENOUS | Status: DC

## 2024-05-02 MED ORDER — PROPOFOL 500 MG/50ML IV EMUL
INTRAVENOUS | Status: DC | PRN
  Administered 2024-05-02: 80 mg via INTRAVENOUS
  Administered 2024-05-02: 100 ug/kg/min via INTRAVENOUS

## 2024-05-02 MED ORDER — PROPOFOL 500 MG/50ML IV EMUL
INTRAVENOUS | Status: AC
Start: 2024-05-02 — End: 2024-05-02
  Filled 2024-05-02: qty 50

## 2024-05-02 MED ORDER — PHENYLEPHRINE HCL (PRESSORS) 10 MG/ML IV SOLN
INTRAVENOUS | Status: AC
  Filled 2024-05-02: qty 1

## 2024-05-02 MED ORDER — PROPOFOL 10 MG/ML IV BOLUS
INTRAVENOUS | Status: AC
Start: 2024-05-02 — End: 2024-05-02
  Filled 2024-05-02: qty 20

## 2024-05-02 MED ORDER — LACTATED RINGERS IV SOLN: INTRAVENOUS | Status: DC | PRN

## 2024-05-02 MED ORDER — DEXMEDETOMIDINE HCL IN NACL 80 MCG/20ML IV SOLN
INTRAVENOUS | Status: DC | PRN
  Administered 2024-05-02: 8 ug via INTRAVENOUS

## 2024-05-02 MED ORDER — PROPOFOL 500 MG/50ML IV EMUL
INTRAVENOUS | Status: AC
  Filled 2024-05-02: qty 50

## 2024-05-02 MED ORDER — PHENYLEPHRINE HCL-NACL 20-0.9 MG/250ML-% IV SOLN
INTRAVENOUS | Status: DC | PRN
  Administered 2024-05-02: 20 ug/min via INTRAVENOUS

## 2024-05-02 MED ORDER — PHENYLEPHRINE HCL (PRESSORS) 10 MG/ML IV SOLN
INTRAVENOUS | Status: DC | PRN
  Administered 2024-05-02: 160 ug via INTRAVENOUS

## 2024-05-02 NOTE — Discharge Instructions (Signed)

## 2024-05-02 NOTE — Anesthesia Postprocedure Evaluation (Signed)
 Anesthesia Post Note  Patient: NOAH LEMBKE  Procedure(s) Performed: COLONOSCOPY     Patient location during evaluation: Endoscopy Anesthesia Type: MAC Level of consciousness: oriented, awake and alert and awake Pain management: pain level controlled Vital Signs Assessment: post-procedure vital signs reviewed and stable Respiratory status: spontaneous breathing, nonlabored ventilation, respiratory function stable and patient connected to nasal cannula oxygen Cardiovascular status: blood pressure returned to baseline and stable Postop Assessment: no headache, no backache and no apparent nausea or vomiting Anesthetic complications: no   No notable events documented.  Last Vitals:  Vitals:   05/02/24 1255 05/02/24 1300  BP: 106/67   Pulse: 90 (!) 117  Resp: 15 20  Temp:    SpO2: 97% 99%    Last Pain:  Vitals:   05/02/24 1255  TempSrc:   PainSc: 0-No pain                 Garnette FORBES Skillern

## 2024-05-02 NOTE — H&P (Signed)
 GASTROENTEROLOGY PROCEDURE H&P NOTE   Primary Care Physician: Rilla Baller, MD  HPI: Stanley Kane is a 58 y.o. male who presents for Colonoscopy for colon cancer screening and incidental issues of rectal bleeding and constipation.  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)    a.) TTE 05/22/2012: EF 50-55%, no RWMAs, mild LAE, norm RVSF, mild MR; b.) TTE 09/26/2017: EF 60-65%, no RWMAs, mild LAE, norm RVSF, triv PR, mild MR/TR; c.) TTE 11/23/2017: EF 45-50%, no RWMAs, mild LAE, norm RVSF, triv MR/PR, mild TR; d.) TTE 04/13/2023: EF 60-65%, no RWMAs, mod LAE, norm RVSF, mild MR   Depression    Difficult intubation    Fatty liver 06/2014   History of kidney stones    Kidney stone    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   a.) MRI chest 07/25/2023: 12.1 x 11.8 x 9.4 cm circumscribed mass to RIGHT chest and lower axilla; overlies oblique musculature and anterior latissimus dorsi   Lipoma of posterior chest wall 12/27/2021   S/p excision 07/2023- pathology: lipoma (8.5 x 4.5 x 3.6 cm well circumscribed) with fat necrosis      Long-term current use of immunosuppressive biologic agent    a.) secukinumab   NICM (nonischemic cardiomyopathy) (HCC)    a.) EF 05/22/2012: EF 50-55%; b.) TTE 09/26/2014: EF 60-65%; c.) MV 09/06/2017: EF 33-44%; d.) TTE 11/23/2017: EF 45-50%; e.) MV 01/15/2020: EF 45%; f.) MV 11/29/2021: EF 60%; g.) TTE 04/13/2023: EF 60-65%   Obesity    On rivaroxaban  therapy    OSA (obstructive sleep apnea)    a.) does not utilize nocturnal PAP therapy   Persistent atrial fibrillation (HCC) 2013   a.) Dx'd in 2013; b.) CHA2DS2-VASc = 1 (HFmrEF) as of 08/09/2023; b.) s/p DCCV 07/06/2012 (150  J x1); c.) s/p DCCV 11/25/2014 (150 J x 1); d.) cardiac rate/rhythm maintained on oral digoxin  + metoprolol  succinate; chronically anticoagulated using rivaroxaban    Plaque psoriasis    a.) Tx'd with secukinumab injections + topical triamcinolone    Prediabetes    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   hospitalization [facility] - stress with father's illness   Vitamin D  deficiency    Past Surgical History:  Procedure Laterality Date   CARDIOVASCULAR STRESS TEST  ~2005   thinks at [facility], told normal   carotid us   08/2014   no plaque   COLONOSCOPY  2000   WNL, for weight loss after started gym   EXTRACORPOREAL SHOCK WAVE LITHOTRIPSY Left 06/01/2023   Procedure: EXTRACORPOREAL SHOCK WAVE LITHOTRIPSY (ESWL);  Surgeon: Francisca Redell BROCKS, MD;  Location: [facility] ORS;  Service: Urology;  Laterality: Left;   LIPOMA EXCISION Right 08/14/2023   Procedure: EXCISION LIPOMA, chest wall;  Surgeon: Marinda Jayson KIDD, MD;  Location: [facility] ORS;  Service: General;  Laterality: Right;   MRI  08/2014   brain - WNL, skull base C1/2 articulation   TONSILLECTOMY AND ADENOIDECTOMY  childhood   US  ECHOCARDIOGRAPHY  08/2014   EF 60%, nl vent fxn, mildly dilated LA, mild MR, mild TR   Current Facility-Administered Medications  Medication Dose Route Frequency Provider Last Rate Last Admin   0.9 %  sodium chloride  infusion   Intravenous Continuous Mansouraty, Aloha  Mickey., MD 20 mL/hr at 05/02/24 1052 New Bag at 05/02/24 1052    Current Facility-Administered Medications:    0.9 %  sodium chloride  infusion, , Intravenous, Continuous, Mansouraty, Aloha Mickey., MD, Last Rate: 20 mL/hr at 05/02/24 1052, New Bag at 05/02/24 1052 Allergies  Allergen Reactions   Vicodin [Hydrocodone-Acetaminophen ]     Makes him jittery   Family History  Problem Relation Age of Onset   Diabetes Mother    Heart disease Mother    Hypertension Father    Coronary artery disease Father 77   Heart attack Father    Cirrhosis Father    Hypercalcemia  Father    CAD Sister 80       deceased from MI   GI Disease Sister    Stroke Neg Hx    Social History   Socioeconomic History   Marital status: Divorced    Spouse name: Not on file   Number of children: Not on file   Years of education: Not on file   Highest education level: Not on file  Occupational History   Not on file  Tobacco Use   Smoking status: Former    Current packs/day: 0.00    Types: Cigarettes    Quit date: 08/29/1996    Years since quitting: 27.6    Passive exposure: Past   Smokeless tobacco: Never  Vaping Use   Vaping status: Never Used  Substance and Sexual Activity   Alcohol use: Not Currently    Alcohol/week: 2.0 standard drinks of alcohol    Types: 2 Cans of beer per week    Comment: quit beginning of 2020   Drug use: Not Currently    Types: Cocaine   Sexual activity: Yes  Other Topics Concern   Not on file  Social History Narrative   Occupation: truck driver, just started back   Edu: 9th grade, GED   Social Drivers of Corporate investment banker Strain: Low Risk  (03/22/2023)   Overall Financial Resource Strain (CARDIA)    Difficulty of Paying Living Expenses: Not very hard  Food Insecurity: No Food Insecurity (03/22/2023)   Hunger Vital Sign    Worried About Running Out of Food in the Last Year: Never true    Ran Out of Food in the Last Year: Never true  Transportation Needs: No Transportation Needs (03/22/2023)   PRAPARE - Administrator, Civil Service (Medical): No    Lack of Transportation (Non-Medical): No  Physical Activity: Inactive (04/18/2019)   Exercise Vital Sign    Days of Exercise per Week: 0 days    Minutes of Exercise per Session: 0 min  Stress: Not on file  Social Connections: Not on file  Intimate Partner Violence: Low Risk  (02/18/2022)   Received from [facility]   History of Abuse    History of Abuse: Denies    Physical Exam: Today's Vitals   04/25/24 1356 05/02/24 1048 05/02/24 1108  BP:   (!) 130/95 (!) 130/95  Pulse:   (!) 101  Resp:  12   Temp:  (!) 97.5 F (36.4 C)   TempSrc:  Temporal   SpO2:  95%   Weight: 113.4 kg 113.4 kg   Height:  5' 8 (1.727 m)   PainSc:  0-No pain    Body mass index is 38.01 kg/m. GEN: NAD EYE: Sclerae anicteric ENT: MMM CV: Non-tachycardic GI: Soft, NT/ND NEURO:  Alert & Oriented x 3  Lab Results: No results for  input(s): WBC, HGB, HCT, PLT in the last 72 hours. BMET No results for input(s): NA, K, CL, CO2, GLUCOSE, BUN, CREATININE, CALCIUM in the last 72 hours. LFT No results for input(s): PROT, ALBUMIN, AST, ALT, ALKPHOS, BILITOT, BILIDIR, IBILI in the last 72 hours. PT/INR No results for input(s): LABPROT, INR in the last 72 hours.   Impression / Plan: This is a 58 y.o.male who presents for Colonoscopy for colon cancer screening and incidental issues of rectal bleeding and constipation.  The risks and benefits of endoscopic evaluation/treatment were discussed with the patient and/or family; these include but are not limited to the risk of perforation, infection, bleeding, missed lesions, lack of diagnosis, severe illness requiring hospitalization, as well as anesthesia and sedation related illnesses.  The patient's history has been reviewed, patient examined, no change in status, and deemed stable for procedure.  The patient and/or family is agreeable to proceed.    Aloha Finner, MD [facility] Office # [phone]

## 2024-05-02 NOTE — Anesthesia Procedure Notes (Signed)
 Procedure Name: MAC Date/Time: 05/02/2024 12:12 PM  Performed by: Nada Corean CROME, CRNAPre-anesthesia Checklist: Emergency Drugs available, Patient identified, Suction available, Patient being monitored and Timeout performed Patient Re-evaluated:Patient Re-evaluated prior to induction Oxygen Delivery Method: Nasal cannula Induction Type: IV induction Placement Confirmation: positive ETCO2 Dental Injury: Teeth and Oropharynx as per pre-operative assessment  Comments: Optiflow used

## 2024-05-02 NOTE — Anesthesia Preprocedure Evaluation (Addendum)
 Anesthesia Evaluation  Patient identified by MRN, date of birth, ID band Patient awake    Reviewed: Allergy & Precautions, NPO status , Patient's Chart, lab work & pertinent test results, reviewed documented beta blocker date and time   History of Anesthesia Complications (+) DIFFICULT AIRWAY and history of anesthetic complications  Airway Mallampati: III  TM Distance: <3 FB Neck ROM: Full    Dental  (+) Dental Advisory Given, Poor Dentition, Chipped, Loose,    Pulmonary sleep apnea , former smoker   Pulmonary exam normal breath sounds clear to auscultation       Cardiovascular hypertension, Pt. on home beta blockers + dysrhythmias Atrial Fibrillation  Rhythm:Irregular Rate:Abnormal     Neuro/Psych  PSYCHIATRIC DISORDERS Anxiety Depression    negative neurological ROS     GI/Hepatic Neg liver ROS,GERD  Medicated,,Rectal bleeding   Endo/Other  negative endocrine ROS    Renal/GU negative Renal ROS     Musculoskeletal negative musculoskeletal ROS (+)    Abdominal   Peds  Hematology  (+) Blood dyscrasia (Xarelto )   Anesthesia Other Findings Day of surgery medications reviewed with the patient.  Reproductive/Obstetrics                              Anesthesia Physical Anesthesia Plan  ASA: 3  Anesthesia Plan: MAC   Post-op Pain Management:    Induction: Intravenous  PONV Risk Score and Plan: 1 and TIVA and Treatment may vary due to age or medical condition  Airway Management Planned: Natural Airway and Simple Face Mask  Additional Equipment:   Intra-op Plan:   Post-operative Plan:   Informed Consent: I have reviewed the patients History and Physical, chart, labs and discussed the procedure including the risks, benefits and alternatives for the proposed anesthesia with the patient or authorized representative who has indicated his/her understanding and acceptance.     Dental  advisory given  Plan Discussed with: CRNA and Anesthesiologist  Anesthesia Plan Comments:          Anesthesia Quick Evaluation

## 2024-05-02 NOTE — Op Note (Signed)
 [facility] Patient Name: Stanley Kane Procedure Date: 05/02/2024 MRN: [id number] Attending MD: Aloha Finner , MD, [id number] Date of Birth: 1966/08/14 CSN: [id number] Age: 58 Admit Type: Outpatient Procedure:                Colonoscopy Indications:              Screening for colorectal malignant neoplasm,                            Incidental - Rectal bleeding Providers:                Aloha Finner, MD, Randall Lines, RN, Lorrayne Kitty, Technician Referring MD:              Medicines:                Monitored Anesthesia Care Complications:            No immediate complications. Estimated Blood Loss:     Estimated blood loss was minimal. Procedure:                Pre-Anesthesia Assessment:                           - Prior to the procedure, a History and Physical                            was performed, and patient medications and                            allergies were reviewed. The patient's tolerance of                            previous anesthesia was also reviewed. The risks                            and benefits of the procedure and the sedation                            options and risks were discussed with the patient.                            All questions were answered, and informed consent                            was obtained. Prior Anticoagulants: The patient has                            taken Xarelto  (rivaroxaban ), last dose was 3 days                            prior to procedure. ASA Grade Assessment: III - A  patient with severe systemic disease. After                            reviewing the risks and benefits, the patient was                            deemed in satisfactory condition to undergo the                            procedure.                           After obtaining informed consent, the colonoscope                            was passed under direct vision.  Throughout the                            procedure, the patient's blood pressure, pulse, and                            oxygen saturations were monitored continuously. The                            CF-HQ190L ([id number]) Olympus colonoscope was                            introduced through the anus and advanced to the the                            cecum, identified by appendiceal orifice and                            ileocecal valve. The colonoscopy was performed                            without difficulty. The patient tolerated the                            procedure. The quality of the bowel preparation was                            good. The ileocecal valve, appendiceal orifice, and                            rectum were photographed. Scope In: 12:22:39 PM Scope Out: 12:32:57 PM Scope Withdrawal Time: 0 hours 7 minutes 17 seconds  Total Procedure Duration: 0 hours 10 minutes 18 seconds  Findings:      The digital rectal exam findings include hemorrhoids. Pertinent       negatives include no palpable rectal lesions.      Two sessile polyps were found in the rectum and recto-sigmoid colon. The       polyps were 2 to 4 mm in size. These polyps were removed with a cold  snare. Resection and retrieval were complete.      Normal mucosa was found in the entire colon otherwise.      Non-bleeding non-thrombosed external and internal hemorrhoids were found       during retroflexion, during perianal exam and during digital exam. The       hemorrhoids were Grade III (internal hemorrhoids that prolapse but       require manual reduction). Impression:               - Hemorrhoids found on digital rectal exam.                           - Two 2 to 4 mm polyps in the rectum and at the                            recto-sigmoid colon, removed with a cold snare.                            Resected and retrieved.                           - Normal mucosa in the entire examined colon                             otherwise.                           - Non-bleeding non-thrombosed external and internal                            hemorrhoids. Moderate Sedation:      Not Applicable - Patient had care per Anesthesia. Recommendation:           - The patient will be observed post-procedure,                            until all discharge criteria are met.                           - Discharge patient to home.                           - Patient has a contact number available for                            emergencies. The signs and symptoms of potential                            delayed complications were discussed with the                            patient. Return to normal activities tomorrow.                            Written discharge instructions were provided to the  patient.                           - High fiber diet.                           - Use FiberCon 1-2 tablets PO daily.                           - Restart Xarelto  on 9/5 PM to decrease risk of                            bleeding post-intervention.                           - No evidence of fissure today. Hemorrhoids are                            etiology of intermittent rectal bleeding. If                            patient is wanting to have this evaluated further,                            I can set up with one of my partners to see about                            whether hemorrhoidal banding is appropriate at time                            of anoscopy vs sending to our colorectal surgeons                            in future.                           - Await pathology results.                           - Repeat colonoscopy for surveillance based on                            pathology 5-10 years.                           - The findings and recommendations were discussed                            with the patient.                           - The findings and recommendations were  discussed                            with the designated responsible adult. Procedure Code(s):        ---  Professional ---                           [phone], Colonoscopy, flexible; with removal of                            tumor(s), polyp(s), or other lesion(s) by snare                            technique Diagnosis Code(s):        --- Professional ---                           Z12.11, Encounter for screening for malignant                            neoplasm of colon                           D12.8, Benign neoplasm of rectum                           D12.7, Benign neoplasm of rectosigmoid junction                           K64.2, Third degree hemorrhoids CPT copyright 2022 American Medical Association. All rights reserved. The codes documented in this report are preliminary and upon coder review may  be revised to meet current compliance requirements. Aloha Finner, MD 05/02/2024 12:45:26 PM Number of Addenda: 0

## 2024-05-02 NOTE — Transfer of Care (Signed)
 Immediate Anesthesia Transfer of Care Note  Patient: Stanley Kane  Procedure(s) Performed: COLONOSCOPY  Patient Location: PACU and Endoscopy Unit  Anesthesia Type:MAC  Level of Consciousness: sedated and responds to stimulation  Airway & Oxygen Therapy: Patient Spontanous Breathing and Patient connected to nasal cannula oxygen  Post-op Assessment: Report given to RN and Post -op Vital signs reviewed and stable  Post vital signs: Reviewed and stable  Last Vitals:  Vitals Value Taken Time  BP 113/74 05/02/24 12:40  Temp    Pulse 85 05/02/24 12:41  Resp 12 05/02/24 12:41  SpO2 98 % 05/02/24 12:41  Vitals shown include unfiled device data.  Last Pain:  Vitals:   05/02/24 1048  TempSrc: Temporal  PainSc: 0-No pain      Patients Stated Pain Goal: 0 (05/02/24 1048)  Complications: No notable events documented.

## 2024-05-05 ENCOUNTER — Encounter (HOSPITAL_COMMUNITY): Payer: Self-pay | Admitting: Gastroenterology

## 2024-05-05 LAB — SURGICAL PATHOLOGY

## 2024-05-06 ENCOUNTER — Encounter: Payer: Self-pay | Admitting: Family Medicine

## 2024-05-06 ENCOUNTER — Ambulatory Visit: Payer: Self-pay | Admitting: Gastroenterology

## 2024-05-15 DIAGNOSIS — I10 Essential (primary) hypertension: Secondary | ICD-10-CM | POA: Diagnosis not present

## 2024-05-15 DIAGNOSIS — Z5986 Financial insecurity: Secondary | ICD-10-CM | POA: Diagnosis not present

## 2024-05-15 DIAGNOSIS — I4821 Permanent atrial fibrillation: Secondary | ICD-10-CM | POA: Diagnosis not present

## 2024-05-15 DIAGNOSIS — F411 Generalized anxiety disorder: Secondary | ICD-10-CM | POA: Diagnosis not present

## 2024-05-15 DIAGNOSIS — Z59811 Housing instability, housed, with risk of homelessness: Secondary | ICD-10-CM | POA: Diagnosis not present

## 2024-05-15 DIAGNOSIS — R0602 Shortness of breath: Secondary | ICD-10-CM | POA: Diagnosis not present

## 2024-05-15 DIAGNOSIS — R600 Localized edema: Secondary | ICD-10-CM | POA: Diagnosis not present

## 2024-05-15 DIAGNOSIS — G4733 Obstructive sleep apnea (adult) (pediatric): Secondary | ICD-10-CM | POA: Diagnosis not present

## 2024-05-22 DIAGNOSIS — L409 Psoriasis, unspecified: Secondary | ICD-10-CM | POA: Diagnosis not present

## 2024-05-22 DIAGNOSIS — Z23 Encounter for immunization: Secondary | ICD-10-CM | POA: Diagnosis not present

## 2024-05-22 DIAGNOSIS — R0609 Other forms of dyspnea: Secondary | ICD-10-CM | POA: Diagnosis not present

## 2024-05-22 DIAGNOSIS — G4733 Obstructive sleep apnea (adult) (pediatric): Secondary | ICD-10-CM | POA: Diagnosis not present

## 2024-05-22 DIAGNOSIS — I482 Chronic atrial fibrillation, unspecified: Secondary | ICD-10-CM | POA: Diagnosis not present

## 2024-05-29 DIAGNOSIS — I4811 Longstanding persistent atrial fibrillation: Secondary | ICD-10-CM | POA: Diagnosis not present

## 2024-05-29 DIAGNOSIS — I4821 Permanent atrial fibrillation: Secondary | ICD-10-CM | POA: Diagnosis not present

## 2024-06-13 ENCOUNTER — Emergency Department
Admission: EM | Admit: 2024-06-13 | Discharge: 2024-06-13 | Disposition: A | Discharge status: Home / Self Care | Attending: Emergency Medicine | Admitting: Emergency Medicine

## 2024-06-13 ENCOUNTER — Other Ambulatory Visit: Payer: Self-pay

## 2024-06-13 ENCOUNTER — Other Ambulatory Visit
Admission: RE | Admit: 2024-06-13 | Discharge: 2024-06-13 | Disposition: A | Discharge status: Home / Self Care | Source: Ambulatory Visit | Attending: Internal Medicine | Admitting: Internal Medicine

## 2024-06-13 DIAGNOSIS — E119 Type 2 diabetes mellitus without complications: Secondary | ICD-10-CM

## 2024-06-13 DIAGNOSIS — Z79899 Other long term (current) drug therapy: Secondary | ICD-10-CM | POA: Diagnosis not present

## 2024-06-13 DIAGNOSIS — G4733 Obstructive sleep apnea (adult) (pediatric): Secondary | ICD-10-CM | POA: Diagnosis not present

## 2024-06-13 DIAGNOSIS — I10 Essential (primary) hypertension: Secondary | ICD-10-CM | POA: Diagnosis not present

## 2024-06-13 DIAGNOSIS — R600 Localized edema: Secondary | ICD-10-CM | POA: Diagnosis not present

## 2024-06-13 DIAGNOSIS — I5022 Chronic systolic (congestive) heart failure: Secondary | ICD-10-CM | POA: Insufficient documentation

## 2024-06-13 DIAGNOSIS — I4811 Longstanding persistent atrial fibrillation: Secondary | ICD-10-CM | POA: Diagnosis not present

## 2024-06-13 DIAGNOSIS — F411 Generalized anxiety disorder: Secondary | ICD-10-CM | POA: Diagnosis not present

## 2024-06-13 DIAGNOSIS — K219 Gastro-esophageal reflux disease without esophagitis: Secondary | ICD-10-CM | POA: Diagnosis not present

## 2024-06-13 DIAGNOSIS — L02214 Cutaneous abscess of groin: Secondary | ICD-10-CM | POA: Insufficient documentation

## 2024-06-13 DIAGNOSIS — I4891 Unspecified atrial fibrillation: Secondary | ICD-10-CM | POA: Insufficient documentation

## 2024-06-13 DIAGNOSIS — R000 Tachycardia, unspecified: Secondary | ICD-10-CM | POA: Insufficient documentation

## 2024-06-13 DIAGNOSIS — R3589 Other polyuria: Secondary | ICD-10-CM | POA: Diagnosis present

## 2024-06-13 DIAGNOSIS — E1165 Type 2 diabetes mellitus with hyperglycemia: Secondary | ICD-10-CM | POA: Diagnosis not present

## 2024-06-13 LAB — CBC
HCT: 43.7 % (ref 39.0–52.0)
Hemoglobin: 15.4 g/dL (ref 13.0–17.0)
MCH: 31.1 pg (ref 26.0–34.0)
MCHC: 35.2 g/dL (ref 30.0–36.0)
MCV: 88.3 fL (ref 80.0–100.0)
Platelets: 274 K/uL (ref 150–400)
RBC: 4.95 MIL/uL (ref 4.22–5.81)
RDW: 12.3 % (ref 11.5–15.5)
WBC: 9.4 K/uL (ref 4.0–10.5)
nRBC: 0 % (ref 0.0–0.2)

## 2024-06-13 LAB — COMPREHENSIVE METABOLIC PANEL WITH GFR
ALT: 35 U/L (ref 0–44)
AST: 29 U/L (ref 15–41)
Albumin: 3.8 g/dL (ref 3.5–5.0)
Alkaline Phosphatase: 61 U/L (ref 38–126)
Anion gap: 14 (ref 5–15)
BUN: 21 mg/dL — ABNORMAL HIGH (ref 6–20)
CO2: 24 mmol/L (ref 22–32)
Calcium: 9.7 mg/dL (ref 8.9–10.3)
Chloride: 89 mmol/L — ABNORMAL LOW (ref 98–111)
Creatinine, Ser: 1.12 mg/dL (ref 0.61–1.24)
GFR, Estimated: >60 mL/min (ref >60)
Glucose, Bld: 460 mg/dL — ABNORMAL HIGH (ref 70–99)
Potassium: 4.5 mmol/L (ref 3.5–5.1)
Sodium: 127 mmol/L — ABNORMAL LOW (ref 135–145)
Total Bilirubin: 0.8 mg/dL (ref 0.0–1.2)
Total Protein: 8.2 g/dL — ABNORMAL HIGH (ref 6.5–8.1)

## 2024-06-13 LAB — DIGOXIN LEVEL: Digoxin Level: <0.6 ng/mL — ABNORMAL LOW (ref 0.8–2.0)

## 2024-06-13 LAB — HEMOGLOBIN A1C
Hgb A1c MFr Bld: 10.8 % — ABNORMAL HIGH (ref 4.8–5.6)
Mean Plasma Glucose: 263.26 mg/dL

## 2024-06-13 LAB — CBG MONITORING, ED: Glucose-Capillary: 257 mg/dL — ABNORMAL HIGH (ref 70–99)

## 2024-06-13 MED ORDER — CEPHALEXIN 500 MG PO CAPS: 500.0000 mg | ORAL_CAPSULE | Freq: Three times a day (TID) | ORAL | 0 refills | Status: AC

## 2024-06-13 MED ORDER — LANCETS MISC: 1.0000 | 2 refills | Status: AC

## 2024-06-13 MED ORDER — INSULIN ASPART 100 UNIT/ML IJ SOLN
8.0000 [IU] | Freq: Once | INTRAMUSCULAR | Status: AC
  Administered 2024-06-13: 8 [IU] via INTRAVENOUS
  Filled 2024-06-13: qty 8

## 2024-06-13 MED ORDER — LANCET DEVICE MISC: 1.0000 | Freq: Three times a day (TID) | 2 refills | Status: AC

## 2024-06-13 MED ORDER — METFORMIN HCL 500 MG PO TABS
500.0000 mg | ORAL_TABLET | Freq: Once | ORAL | Status: AC
  Administered 2024-06-13: 500 mg via ORAL
  Filled 2024-06-13: qty 1

## 2024-06-13 MED ORDER — METFORMIN HCL 500 MG PO TABS: 500.0000 mg | ORAL_TABLET | Freq: Two times a day (BID) | ORAL | 0 refills | Status: DC

## 2024-06-13 MED ORDER — BLOOD GLUCOSE TEST VI STRP: 1.0000 | ORAL_STRIP | Freq: Three times a day (TID) | 2 refills | Status: AC

## 2024-06-13 MED ORDER — CEPHALEXIN 500 MG PO CAPS
500.0000 mg | ORAL_CAPSULE | Freq: Once | ORAL | Status: AC
  Administered 2024-06-13: 500 mg via ORAL
  Filled 2024-06-13: qty 1

## 2024-06-13 MED ORDER — LIVING WELL WITH DIABETES BOOK
Freq: Once | Status: DC
  Filled 2024-06-13: qty 1

## 2024-06-13 MED ORDER — BLOOD GLUCOSE MONITORING SUPPL DEVI: 1.0000 | Freq: Three times a day (TID) | 0 refills | Status: AC

## 2024-06-13 MED ORDER — SODIUM CHLORIDE 0.9 % IV BOLUS
2000.0000 mL | Freq: Once | INTRAVENOUS | Status: AC
  Administered 2024-06-13: 2000 mL via INTRAVENOUS

## 2024-06-13 NOTE — ED Triage Notes (Signed)
 Patient states he had routine labs today and was sent over for glucose 620. States he has felt tired all day.

## 2024-06-13 NOTE — ED Provider Notes (Signed)
 [facility] Provider Note    Event Date/Time   First MD Initiated Contact with Patient 06/13/24 1609     (approximate)   History   Chief Complaint: Abnormal Labs   HPI  Stanley Kane is a 58 y.o. male with a history of atrial fibrillation, obesity who comes to the ED due to fatigue, worsening over the past 5 days associated with polyuria and polydipsia.  Denies fever chills cough chest pain shortness of breath belly pain, no vomiting or diarrhea.  Does report that over the last week he had a bump at the left hip that recently ruptured and drained pus.  He notes that the wound is not healing as expected.        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)    a.) TTE 05/22/2012: EF 50-55%, no RWMAs, mild LAE, norm RVSF, mild MR; b.) TTE 09/26/2017: EF 60-65%, no RWMAs, mild LAE, norm RVSF, triv PR, mild MR/TR; c.) TTE 11/23/2017: EF 45-50%, no RWMAs, mild LAE, norm RVSF, triv MR/PR, mild TR; d.) TTE 04/13/2023: EF 60-65%, no RWMAs, mod LAE, norm RVSF, mild MR   Depression    Difficult intubation    Fatty liver 06/2014   History of kidney stones    Kidney stone    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   a.) MRI chest 07/25/2023: 12.1 x 11.8 x 9.4 cm circumscribed mass to RIGHT chest and lower axilla; overlies oblique musculature and anterior latissimus dorsi   Lipoma of posterior chest wall 12/27/2021   S/p excision 07/2023- pathology: lipoma (8.5 x 4.5 x 3.6 cm well circumscribed) with fat necrosis      Long-term current use of immunosuppressive biologic agent    a.) secukinumab   NICM (nonischemic cardiomyopathy) (HCC)    a.) EF 05/22/2012: EF 50-55%; b.) TTE 09/26/2014: EF  60-65%; c.) MV 09/06/2017: EF 33-44%; d.) TTE 11/23/2017: EF 45-50%; e.) MV 01/15/2020: EF 45%; f.) MV 11/29/2021: EF 60%; g.) TTE 04/13/2023: EF 60-65%   Obesity    On rivaroxaban  therapy    OSA (obstructive sleep apnea)    a.) does not utilize nocturnal PAP therapy   Persistent atrial fibrillation (HCC) 2013   a.) Dx'd in 2013; b.) CHA2DS2-VASc = 1 (HFmrEF) as of 08/09/2023; b.) s/p DCCV 07/06/2012 (150 J x1); c.) s/p DCCV 11/25/2014 (150 J x 1); d.) cardiac rate/rhythm maintained on oral digoxin  + metoprolol  succinate; chronically anticoagulated using rivaroxaban    Plaque psoriasis    a.) Tx'd with secukinumab injections + topical triamcinolone    Prediabetes    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   hospitalization [facility] - stress with father's illness   Vitamin D  deficiency     Current Outpatient Rx   Order #: [id number] Class: Normal   Order #: [id number] Class: Normal   Order #: [id number] Class: Normal   Order #: [id number] Class: Normal   Order #: [id number] Class: Normal   Order #: [id number] Class: Normal   Order #: [id number] Class: Print   Order #: [id number] Class: Historical Med   Order #: [id number] Class: Normal   Order #: [id number] Class: Normal   Order #: [id number] Class: Normal   Order #: [id number] Class: Normal   Order #: [id number] Class: Normal   Order #:  [id number] Class: Normal   Order #: [id number] Class: Historical Med   Order #: [id number] Class: Normal   Order #: [id number] Class: Historical Med   Order #: [id number] Class: Normal   Order #: [id number] Class: Historical Med   Order #: [id number] Class: Historical Med    Past Surgical History:  Procedure Laterality Date   CARDIOVASCULAR STRESS TEST  ~2005   thinks at [facility], told normal   carotid us   08/29/2014   no plaque   COLONOSCOPY  08/29/1998   WNL, for weight loss after started gym   COLONOSCOPY N/A 05/02/2024   HP, hemorrhoids, rpt (Mansouraty, Aloha Raddle., MD)   EXTRACORPOREAL SHOCK WAVE LITHOTRIPSY Left 06/01/2023    Procedure: EXTRACORPOREAL SHOCK WAVE LITHOTRIPSY (ESWL);  Surgeon: Francisca Redell BROCKS, MD;  Location: [facility] ORS;  Service: Urology;  Laterality: Left;   LIPOMA EXCISION Right 08/14/2023   Procedure: EXCISION LIPOMA, chest wall;  Surgeon: Marinda Jayson KIDD, MD;  Location: [facility] ORS;  Service: General;  Laterality: Right;   MRI  08/29/2014   brain - WNL, skull base C1/2 articulation   TONSILLECTOMY AND ADENOIDECTOMY  childhood   US  ECHOCARDIOGRAPHY  08/29/2014   EF 60%, nl vent fxn, mildly dilated LA, mild MR, mild TR    Physical Exam   Triage Vital Signs: ED Triage Vitals  Encounter Vitals Group     BP 06/13/24 1552 107/71     Girls Systolic BP Percentile --      Girls Diastolic BP Percentile --      Boys Systolic BP Percentile --      Boys Diastolic BP Percentile --      Pulse Rate 06/13/24 1552 (!) 136     Resp 06/13/24 1552 18     Temp 06/13/24 1552 98.5 F (36.9 C)     Temp Source 06/13/24 1552 Oral     SpO2 06/13/24 1552 97 %     Weight 06/13/24 1553 240 lb (108.9 kg)     Height 06/13/24 1553 5' 7 (1.702 m)     Head Circumference --      Peak Flow --      Pain Score 06/13/24 1552 0     Pain Loc --      Pain Education --      Exclude from Growth Chart --     Most recent vital signs: Vitals:   06/13/24 1730 06/13/24 1800  BP: 100/69 114/62  Pulse: 90 (!) 104  Resp: 15 19  Temp:    SpO2: 100% 100%    General: Awake, no distress.  CV:  Good peripheral perfusion.  Tachycardia heart rate 130 Resp:  Normal effort.  Clear lungs Abd:  No distention.  Soft nontender Other:  Dry oral mucosa.  Left hip shows cutaneous abscess cavity, approximately 2 cm in total size, no significant surrounding cellulitis.   ED Results / Procedures / Treatments   Labs (all labs ordered are listed, but only abnormal results are displayed) Labs Reviewed  COMPREHENSIVE METABOLIC PANEL WITH GFR - Abnormal; Notable for the following components:      Result Value   Sodium 127 (*)    Chloride  89 (*)    Glucose, Bld 460 (*)    BUN 21 (*)    Total Protein 8.2 (*)    All other components within normal limits  CBG MONITORING, ED - Abnormal; Notable for the following components:   Glucose-Capillary 257 (*)    All other components within normal limits  CBC  URINALYSIS, ROUTINE W REFLEX MICROSCOPIC  HEMOGLOBIN A1C  CBG MONITORING, ED     EKG Interpreted by me Atrial fibrillation rate 134.  Normal axis, normal intervals.  Normal QRS ST segments.  There are T wave inversions in inferior leads.  Not significantly changed from prior EKG on November 13, 2023.   RADIOLOGY    PROCEDURES:  Procedures   MEDICATIONS ORDERED IN ED: Medications  living well with diabetes book MISC (0 each Does not apply Hold 06/13/24 1702)  cephALEXin  (KEFLEX ) capsule 500 mg (has no administration in time range)  metFORMIN (GLUCOPHAGE) tablet 500 mg (has no administration in time range)  insulin aspart (novoLOG) injection 8 Units (8 Units Intravenous Given 06/13/24 1701)  sodium chloride  0.9 % bolus 2,000 mL (0 mLs Intravenous Stopped 06/13/24 1837)     IMPRESSION / MDM / ASSESSMENT AND PLAN / ED COURSE  I reviewed the triage vital signs and the nursing notes.  DDx: New onset diabetes, metabolic acidosis, electrolyte derangement, dehydration, anemia, uncontrolled atrial fibrillation  Patient's presentation is most consistent with acute presentation with potential threat to life or bodily function.  Patient presents with symptoms of hyperglycemia and dehydration.  Heart rate about 130 in atrial fibrillation, improved with IV fluid hydration.  Other vital signs are normal.  Labs are reassuring, no signs of acidosis.  After hydration and glycemic control with fluids and IV aspart, will discharge with antibiotics for the left leg superficial wound infection and start metformin.     ----------------------------------------- 7:12 PM on 06/13/2024 ----------------------------------------- Blood  glucose 250.  Feeling better.  Will start on medications, follow-up PCP.     FINAL CLINICAL IMPRESSION(S) / ED DIAGNOSES   Final diagnoses:  New onset type 2 diabetes mellitus (HCC)  Cutaneous abscess of groin     Rx / DC Orders   ED Discharge Orders          Ordered    Blood Glucose Monitoring Suppl DEVI  3 times daily        06/13/24 1912    Glucose Blood (BLOOD GLUCOSE TEST STRIPS) STRP  3 times daily        06/13/24 1912    Lancet Device MISC  3 times daily        06/13/24 1912    Lancets MISC  As directed        06/13/24 1912    metFORMIN (GLUCOPHAGE) 500 MG tablet  2 times daily with meals        06/13/24 1912    cephALEXin  (KEFLEX ) 500 MG capsule  3 times daily        06/13/24 1912             Note:  This document was prepared using Dragon voice recognition software and may include unintentional dictation errors.   Viviann Pastor, MD 06/13/24 [phone]

## 2024-06-17 DIAGNOSIS — I4811 Longstanding persistent atrial fibrillation: Secondary | ICD-10-CM | POA: Diagnosis not present

## 2024-06-18 ENCOUNTER — Ambulatory Visit: Payer: Self-pay | Admitting: Family Medicine

## 2024-06-18 DIAGNOSIS — Z1331 Encounter for screening for depression: Secondary | ICD-10-CM | POA: Diagnosis not present

## 2024-06-18 DIAGNOSIS — E1165 Type 2 diabetes mellitus with hyperglycemia: Secondary | ICD-10-CM | POA: Diagnosis not present

## 2024-06-18 DIAGNOSIS — E66812 Obesity, class 2: Secondary | ICD-10-CM | POA: Diagnosis not present

## 2024-06-18 DIAGNOSIS — E785 Hyperlipidemia, unspecified: Secondary | ICD-10-CM | POA: Diagnosis not present

## 2024-06-18 NOTE — Progress Notes (Unsigned)
 06/20/2024 9:13 PM   Stanley Kane 17-May-1966 [id number]  Referring provider: Rilla Baller, MD [address] [place],  [place] [address]  Urological history: 1.  Nephrolithiasis - stone composition 65% calcium oxalate monohydrate, 30% calcium oxalate dihydrate and 5% calcium phosphate  -left ESWL (05/2023)   No chief complaint on file.  HPI: Stanley Kane is a 58 y.o. male who presents today for follow up.    Previous records reviewed.    His stone was originally discovered back in June, but his treatment was delayed due to the need for cardiac clearance.  He underwent ESWL on June 01, 2023.  Postprocedural course was as expected and uneventful.  He had some gross hematuria directly after the procedure, but that resolved quickly.  He passed the stone over the weekend after the procedure.  He forgot his fragments at home.  He will bring them back to us  on Monday.  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)    a.) TTE 05/22/2012: EF 50-55%, no RWMAs, mild LAE, norm RVSF, mild MR; b.) TTE 09/26/2017: EF 60-65%, no RWMAs, mild LAE, norm RVSF, triv PR, mild MR/TR; c.) TTE 11/23/2017: EF 45-50%, no RWMAs, mild LAE, norm RVSF, triv MR/PR, mild TR; d.) TTE 04/13/2023: EF 60-65%, no RWMAs, mod LAE, norm RVSF, mild MR   Depression    Difficult intubation    Fatty liver 06/2014   History of kidney stones    Kidney stone    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   a.) MRI chest 07/25/2023: 12.1 x 11.8 x 9.4 cm circumscribed mass to RIGHT chest and lower axilla; overlies oblique musculature and anterior latissimus  dorsi   Lipoma of posterior chest wall 12/27/2021   S/p excision 07/2023- pathology: lipoma (8.5 x 4.5 x 3.6 cm well circumscribed) with fat necrosis      Long-term current use of immunosuppressive biologic agent    a.) secukinumab   NICM (nonischemic cardiomyopathy) (HCC)    a.) EF 05/22/2012: EF 50-55%; b.) TTE 09/26/2014: EF 60-65%; c.) MV 09/06/2017: EF 33-44%; d.) TTE 11/23/2017: EF 45-50%; e.) MV 01/15/2020: EF 45%; f.) MV 11/29/2021: EF 60%; g.) TTE 04/13/2023: EF 60-65%   Obesity    On rivaroxaban  therapy    OSA (obstructive sleep apnea)    a.) does not utilize nocturnal PAP therapy   Persistent atrial fibrillation (HCC) 2013   a.) Dx'd in 2013; b.) CHA2DS2-VASc = 1 (HFmrEF) as of 08/09/2023; b.) s/p DCCV 07/06/2012 (150 J x1); c.) s/p DCCV 11/25/2014 (150 J x 1); d.) cardiac rate/rhythm maintained on oral digoxin  + metoprolol  succinate; chronically anticoagulated using rivaroxaban    Plaque psoriasis    a.) Tx'd with secukinumab injections + topical triamcinolone    Prediabetes    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   hospitalization [facility] - stress with father's illness   Vitamin D  deficiency     Surgical History: Past Surgical History:  Procedure Laterality Date   CARDIOVASCULAR STRESS TEST  ~2005   thinks at [facility], told normal   carotid us   08/29/2014   no plaque   COLONOSCOPY  08/29/1998   WNL, for weight loss after started gym   COLONOSCOPY N/A 05/02/2024   HP, hemorrhoids, rpt (Mansouraty, Aloha Raddle., MD)   EXTRACORPOREAL SHOCK WAVE LITHOTRIPSY Left 06/01/2023   Procedure: EXTRACORPOREAL SHOCK WAVE LITHOTRIPSY (ESWL);  Surgeon: Francisca Redell BROCKS, MD;  Location: [facility] ORS;  Service: Urology;  Laterality: Left;   LIPOMA EXCISION Right 08/14/2023   Procedure: EXCISION LIPOMA, chest wall;  Surgeon: Marinda Jayson KIDD, MD;  Location: [facility] ORS;  Service: General;  Laterality: Right;   MRI  08/29/2014   brain - WNL, skull base C1/2 articulation   TONSILLECTOMY AND ADENOIDECTOMY   childhood   US  ECHOCARDIOGRAPHY  08/29/2014   EF 60%, nl vent fxn, mildly dilated LA, mild MR, mild TR    Home Medications:  Allergies as of 06/20/2024       Reactions   Vicodin [hydrocodone-acetaminophen ]    Makes him jittery        Medication List        Accurate as of June 18, 2024  9:13 PM. If you have any questions, ask your nurse or doctor.          AMBULATORY NON FORMULARY MEDICATION Medication Name: Diltiazem  2%/ lidocaine  5%.  Apply pea sized amount three times a day for 8 weeks   Blood Glucose Monitoring Suppl Devi 1 each by Does not apply route in the morning, at noon, and at bedtime. May substitute to any manufacturer covered by patient's insurance.   BLOOD GLUCOSE TEST STRIPS Strp 1 each by In Vitro route in the morning, at noon, and at bedtime. May substitute to any manufacturer covered by patient's insurance.   cephALEXin  500 MG capsule Commonly known as: KEFLEX  Take 1 capsule (500 mg total) by mouth 3 (three) times daily for 7 days.   Cosentyx (300 MG Dose) 150 MG/ML Sosy Generic drug: Secukinumab (300 MG Dose) Inject 2 mLs (300 mg total) into the skin every 30 (thirty) days.   Denta 5000 Plus 1.1 % Crea dental cream Generic drug: sodium fluoride Place 1 Application onto teeth daily.   digoxin  0.125 MG tablet Commonly known as: LANOXIN  Take 1 tablet (0.125 mg total) by mouth daily.   escitalopram  20 MG tablet Commonly known as: Lexapro  Take 1 tablet (20 mg total) by mouth daily.   furosemide  20 MG tablet Commonly known as: LASIX  Take 1 tablet (20 mg total) by mouth as needed for edema.   Lancet Device Misc 1 each by Does not apply route in the morning, at noon, and at bedtime. May substitute to any manufacturer covered by patient's insurance.   Lancets Misc 1 each by Does not apply route as directed. Dispense based on patient and insurance preference. Use up to four times daily as directed. (FOR ICD-10 E10.9, E11.9).   metFORMIN  500 MG tablet Commonly known as: GLUCOPHAGE Take 1 tablet (500 mg total) by mouth 2 (two) times daily with a meal.   metoprolol  succinate 25 MG 24 hr tablet Commonly known as: TOPROL -XL Take 1 tablet (25 mg total) by mouth in the morning and at bedtime.   MIRALAX PO Take 1 Capful by mouth at bedtime.   nifedipine  0.3 % ointment Place 1 Application rectally in the morning, at noon, and at bedtime. For 3 weeks   pantoprazole  40 MG tablet Commonly known as: PROTONIX  Take 1 tablet (40 mg total) by mouth 2 (two) times daily.   rivaroxaban  20 MG Tabs tablet Commonly known as: XARELTO  Take 1 tablet (20 mg total) by  mouth daily with supper.   triamcinolone  cream 0.1 % Commonly known as: KENALOG  APPLY TO AFFECTED AREA TWICE A DAY AS DIRECTED   Vitamin D  (Ergocalciferol ) 50000 units Caps Take 5,000 Units by mouth once a week.   Vtama 1 % Crea Generic drug: Tapinarof Apply 1 Application topically daily.        Allergies:  Allergies  Allergen Reactions   Vicodin [Hydrocodone-Acetaminophen ]     Makes him jittery    Family History: Family History  Problem Relation Age of Onset   Diabetes Mother    Heart disease Mother    Hypertension Father    Coronary artery disease Father 74   Heart attack Father    Cirrhosis Father    Hypercalcemia Father    CAD Sister 69       deceased from MI   GI Disease Sister    Stroke Neg Hx     Social History:  reports that he quit smoking about 27 years ago. His smoking use included cigarettes. He has been exposed to tobacco smoke. He has never used smokeless tobacco. He reports that he does not currently use alcohol after a past usage of about 2.0 standard drinks of alcohol per week. He reports that he does not currently use drugs after having used the following drugs: Cocaine.  ROS: Pertinent ROS in HPI  Physical Exam: There were no vitals taken for this visit.  Constitutional:  Well nourished. Alert and oriented, No acute  distress. HEENT:  AT, moist mucus membranes.  Trachea midline Cardiovascular: No clubbing, cyanosis, or edema. Respiratory: Normal respiratory effort, no increased work of breathing. Neurologic: Grossly intact, no focal deficits, moving all 4 extremities. Psychiatric: Normal mood and affect.  Laboratory Data: Lab Results  Component Value Date   WBC 9.4 06/13/2024   HGB 15.4 06/13/2024   HCT 43.7 06/13/2024   MCV 88.3 06/13/2024   PLT 274 06/13/2024    Lab Results  Component Value Date   CREATININE 1.12 06/13/2024    Lab Results  Component Value Date   PSA 0.90 03/08/2023   PSA 0.58 12/27/2021   PSA 0.6 10/10/2011    Lab Results  Component Value Date   HGBA1C 10.8 (H) 06/13/2024    Lab Results  Component Value Date   TSH 2.28 03/08/2023       Component Value Date/Time   CHOL 166 06/27/2023 0849   CHOL 157 09/25/2014 1731   CHOL 175 10/10/2011 0000   HDL 44.40 06/27/2023 0849   HDL 33 (L) 09/25/2014 1731   CHOLHDL 4 06/27/2023 0849   VLDL 39.2 06/27/2023 0849   VLDL 50 (H) 09/25/2014 1731   LDLCALC 82 06/27/2023 0849   LDLCALC 74 09/25/2014 1731    Lab Results  Component Value Date   AST 29 06/13/2024   Lab Results  Component Value Date   ALT 35 06/13/2024   Urinalysis See EPIC and HPI I have reviewed the labs.   Pertinent Imaging: KUB left ureteral stone no longer visible, radiologist interpretation still pending  I have independently reviewed the films.    Assessment & Plan:    1. Left ureteral stone -UA negative for micro heme -KUB stone no longer present  -He will bring fragments on Monday so we can send them for analysis  2.  Microscopic hematuria -UA negative for micro heme -resolved   No follow-ups on file.  These notes generated with voice recognition software. I apologize for typographical errors.  Donte Lenzo, PA-C  [facility] [address]  [address] [place], [place] [address] [phone]

## 2024-06-19 ENCOUNTER — Other Ambulatory Visit: Payer: Self-pay | Admitting: Urology

## 2024-06-19 DIAGNOSIS — N200 Calculus of kidney: Secondary | ICD-10-CM

## 2024-06-20 ENCOUNTER — Ambulatory Visit: Payer: Self-pay | Admitting: Urology

## 2024-06-20 ENCOUNTER — Encounter: Payer: Self-pay | Admitting: Urology

## 2024-06-20 ENCOUNTER — Ambulatory Visit
Admission: RE | Admit: 2024-06-20 | Discharge: 2024-06-20 | Disposition: A | Discharge status: Home / Self Care | Attending: Urology | Admitting: Urology

## 2024-06-20 ENCOUNTER — Ambulatory Visit
Admission: RE | Admit: 2024-06-20 | Discharge: 2024-06-20 | Disposition: A | Source: Ambulatory Visit | Attending: Urology

## 2024-06-20 VITALS — BP 106/74 | HR 86 | Ht 67.0 in | Wt 240.0 lb

## 2024-06-20 DIAGNOSIS — Z87442 Personal history of urinary calculi: Secondary | ICD-10-CM | POA: Diagnosis not present

## 2024-06-20 DIAGNOSIS — N200 Calculus of kidney: Secondary | ICD-10-CM

## 2024-06-20 DIAGNOSIS — R10A0 Flank pain, unspecified side: Secondary | ICD-10-CM | POA: Diagnosis not present

## 2024-07-01 ENCOUNTER — Ambulatory Visit (INDEPENDENT_AMBULATORY_CARE_PROVIDER_SITE_OTHER): Admitting: Family Medicine

## 2024-07-01 ENCOUNTER — Encounter: Payer: Self-pay | Admitting: Family Medicine

## 2024-07-01 VITALS — BP 118/70 | HR 58 | Temp 97.7°F | Ht 67.0 in | Wt 245.5 lb

## 2024-07-01 DIAGNOSIS — L02214 Cutaneous abscess of groin: Secondary | ICD-10-CM

## 2024-07-01 DIAGNOSIS — K219 Gastro-esophageal reflux disease without esophagitis: Secondary | ICD-10-CM | POA: Diagnosis not present

## 2024-07-01 DIAGNOSIS — I420 Dilated cardiomyopathy: Secondary | ICD-10-CM | POA: Diagnosis not present

## 2024-07-01 DIAGNOSIS — E785 Hyperlipidemia, unspecified: Secondary | ICD-10-CM | POA: Diagnosis not present

## 2024-07-01 DIAGNOSIS — Z23 Encounter for immunization: Secondary | ICD-10-CM | POA: Diagnosis not present

## 2024-07-01 DIAGNOSIS — I4811 Longstanding persistent atrial fibrillation: Secondary | ICD-10-CM | POA: Diagnosis not present

## 2024-07-01 DIAGNOSIS — Z7901 Long term (current) use of anticoagulants: Secondary | ICD-10-CM | POA: Diagnosis not present

## 2024-07-01 DIAGNOSIS — E1169 Type 2 diabetes mellitus with other specified complication: Secondary | ICD-10-CM | POA: Diagnosis not present

## 2024-07-01 DIAGNOSIS — Z7984 Long term (current) use of oral hypoglycemic drugs: Secondary | ICD-10-CM | POA: Diagnosis not present

## 2024-07-01 DIAGNOSIS — G4733 Obstructive sleep apnea (adult) (pediatric): Secondary | ICD-10-CM | POA: Diagnosis not present

## 2024-07-01 DIAGNOSIS — I4821 Permanent atrial fibrillation: Secondary | ICD-10-CM

## 2024-07-01 DIAGNOSIS — I10 Essential (primary) hypertension: Secondary | ICD-10-CM | POA: Diagnosis not present

## 2024-07-01 DIAGNOSIS — R600 Localized edema: Secondary | ICD-10-CM | POA: Diagnosis not present

## 2024-07-01 DIAGNOSIS — E119 Type 2 diabetes mellitus without complications: Secondary | ICD-10-CM | POA: Diagnosis not present

## 2024-07-01 DIAGNOSIS — E66811 Obesity, class 1: Secondary | ICD-10-CM | POA: Diagnosis not present

## 2024-07-01 NOTE — Progress Notes (Unsigned)
 Ph: [phone] Fax: [phone]   Patient ID: Stanley Kane, male    DOB: [birth year], 58 y.o.   MRN: [id number]  This visit was conducted in person.  BP 118/70   Pulse (!) 58   Temp 97.7 F (36.5 C) (Oral)   Ht 5' 7 (1.702 m)   Wt 245 lb 8 oz (111.4 kg)   SpO2 97%   BMI 38.45 kg/m    CC: ER f/u visit  Subjective:   HPI: Stanley Kane is a 58 y.o. male presenting on 07/01/2024 for Follow-up (Pt here for hosp f/u)   I last saw patient 10/2023.  Seen at [facility] ER on 06/13/2024 with polyuria, polydipsia, found to have cbg up to 620, A1c 10.8%, as well as atrial fibrillation with RVR that improved with IVF.  He was started on metformin 500mg  bid, as well as keflex  course for thigh abscess/cellulitis that had ruptured on its own.   He established with [facility] Dr Solum seen 06/18/2024 - continue metformin with option to increase to 1500mg  daily, referred to diabetes education classes, planned 8wk f/u. Has upcoming diabetes education classes next month.   Brings in fasting sugar log showing sugars 103-144 over the past 10 days. He notes he's made significant diet changes as well.   Notes blurry vision that started a few weeks ago. This is despite new glasses which he received 11/2023 (latest eye exam). No amaurosis fugax.   Known afib followed by [facility] Dr Florencio as well as EP Dr Melchor. He is on xarelto , digoxin , toprol  XL 25mg  bid, discussing ablation 08/15/2024. Most recently started on flecainide  100mg  daily.   Known kidney stones - sees urology, no stone in the past 2 yrs.      Relevant past medical, surgical, family and social history reviewed and updated as indicated. Interim medical history since our last visit reviewed. Allergies and medications reviewed and updated. Outpatient Medications Prior to Visit  Medication Sig Dispense Refill   flecainide  (TAMBOCOR ) 100 MG tablet Take 1 tablet (100 mg total) by mouth 2 (two) times daily.     AMBULATORY NON  FORMULARY MEDICATION Medication Name: Diltiazem  2%/ lidocaine  5%.  Apply pea sized amount three times a day for 8 weeks 30 g 0   Blood Glucose Monitoring Suppl DEVI 1 each by Does not apply route in the morning, at noon, and at bedtime. May substitute to any manufacturer covered by patient's insurance. 1 each 0   DENTA 5000 PLUS 1.1 % CREA dental cream Place 1 Application onto teeth daily.     digoxin  (LANOXIN ) 0.125 MG tablet Take 1 tablet (0.125 mg total) by mouth daily. 90 tablet 3   escitalopram  (LEXAPRO ) 20 MG tablet Take 1 tablet (20 mg total) by mouth daily. 90 tablet 3   furosemide  (LASIX ) 20 MG tablet Take 1 tablet (20 mg total) by mouth as needed for edema. 90 tablet 3   Glucose Blood (BLOOD GLUCOSE TEST STRIPS) STRP 1 each by In Vitro route in the morning, at noon, and at bedtime. May substitute to any manufacturer covered by patient's insurance. 100 strip 2   Lancet Device MISC 1 each by Does not apply route in the morning, at noon, and at bedtime. May substitute to any manufacturer covered by patient's insurance. 1 each 2   Lancets MISC 1 each by Does not apply route as directed. Dispense based on patient and insurance preference. Use up to four times daily as directed. (FOR ICD-10 E10.9, E11.9).  100 each 2   metFORMIN (GLUCOPHAGE) 500 MG tablet Take 1 tablet (500 mg total) by mouth daily with breakfast AND 2 tablets (1,000 mg total) daily with supper.     metoprolol  succinate (TOPROL -XL) 50 MG 24 hr tablet Take 1 tablet (50 mg total) by mouth in the morning and at bedtime.     nifedipine  0.3 % ointment Place 1 Application rectally in the morning, at noon, and at bedtime. For 3 weeks 30 g 0   pantoprazole  (PROTONIX ) 40 MG tablet Take 1 tablet (40 mg total) by mouth 2 (two) times daily. 60 tablet 3   Polyethylene Glycol 3350 (MIRALAX PO) Take 1 Capful by mouth at bedtime.     rivaroxaban  (XARELTO ) 20 MG TABS tablet Take 1 tablet (20 mg total) by mouth daily with supper. 90 tablet 1    Secukinumab, 300 MG Dose, (COSENTYX, 300 MG DOSE,) 150 MG/ML SOSY Inject 2 mLs (300 mg total) into the skin every 30 (thirty) days.     triamcinolone  cream (KENALOG ) 0.1 % APPLY TO AFFECTED AREA TWICE A DAY AS DIRECTED 80 g 0   Vitamin D , Ergocalciferol , 50000 units CAPS Take 5,000 Units by mouth once a week.     VTAMA 1 % CREA Apply 1 Application topically daily.     metFORMIN (GLUCOPHAGE) 500 MG tablet Take 1 tablet (500 mg total) by mouth 2 (two) times daily with a meal. 180 tablet 0   metoprolol  succinate (TOPROL -XL) 25 MG 24 hr tablet Take 1 tablet (25 mg total) by mouth in the morning and at bedtime. 180 tablet 3   No facility-administered medications prior to visit.     Per HPI unless specifically indicated in ROS section below Review of Systems  Objective:  BP 118/70   Pulse (!) 58   Temp 97.7 F (36.5 C) (Oral)   Ht 5' 7 (1.702 m)   Wt 245 lb 8 oz (111.4 kg)   SpO2 97%   BMI 38.45 kg/m   Wt Readings from Last 3 Encounters:  07/01/24 245 lb 8 oz (111.4 kg)  06/20/24 240 lb (108.9 kg)  06/13/24 240 lb (108.9 kg)      Physical Exam Vitals and nursing note reviewed.  Constitutional:      Appearance: Normal appearance. He is not ill-appearing.  HENT:     Head: Normocephalic and atraumatic.     Mouth/Throat:     Mouth: Mucous membranes are moist.     Pharynx: Oropharynx is clear. No oropharyngeal exudate or posterior oropharyngeal erythema.  Eyes:     Extraocular Movements: Extraocular movements intact.     Pupils: Pupils are equal, round, and reactive to light.  Cardiovascular:     Rate and Rhythm: Regular rhythm. Bradycardia present.     Pulses: Normal pulses.     Heart sounds: Normal heart sounds. No murmur heard. Pulmonary:     Effort: Pulmonary effort is normal. No respiratory distress.     Breath sounds: Normal breath sounds. No wheezing, rhonchi or rales.  Musculoskeletal:     Cervical back: Normal range of motion and neck supple.     Right lower leg: No  edema.     Left lower leg: No edema.  Skin:    General: Skin is warm and dry.  Neurological:     Mental Status: He is alert.  Psychiatric:        Mood and Affect: Mood normal.        Behavior: Behavior normal.  Results for orders placed or performed during the hospital encounter of 06/13/24  Comprehensive metabolic panel   Collection Time: 06/13/24  3:57 PM  Result Value Ref Range   Sodium 127 (L) 135 - 145 mmol/L   Potassium 4.5 3.5 - 5.1 mmol/L   Chloride 89 (L) 98 - 111 mmol/L   CO2 24 22 - 32 mmol/L   Glucose, Bld 460 (H) 70 - 99 mg/dL   BUN 21 (H) 6 - 20 mg/dL   Creatinine, Ser 8.87 0.61 - 1.24 mg/dL   Calcium 9.7 8.9 - 89.6 mg/dL   Total Protein 8.2 (H) 6.5 - 8.1 g/dL   Albumin 3.8 3.5 - 5.0 g/dL   AST 29 15 - 41 U/L   ALT 35 0 - 44 U/L   Alkaline Phosphatase 61 38 - 126 U/L   Total Bilirubin 0.8 0.0 - 1.2 mg/dL   GFR, Estimated >39 >39 mL/min   Anion gap 14 5 - 15  CBC   Collection Time: 06/13/24  3:57 PM  Result Value Ref Range   WBC 9.4 4.0 - 10.5 K/uL   RBC 4.95 4.22 - 5.81 MIL/uL   Hemoglobin 15.4 13.0 - 17.0 g/dL   HCT 56.2 60.9 - 47.9 %   MCV 88.3 80.0 - 100.0 fL   MCH 31.1 26.0 - 34.0 pg   MCHC 35.2 30.0 - 36.0 g/dL   RDW 87.6 88.4 - 84.4 %   Platelets 274 150 - 400 K/uL   nRBC 0.0 0.0 - 0.2 %  Hemoglobin A1c   Collection Time: 06/13/24  3:57 PM  Result Value Ref Range   Hgb A1c MFr Bld 10.8 (H) 4.8 - 5.6 %   Mean Plasma Glucose 263.26 mg/dL  CBG monitoring, ED   Collection Time: 06/13/24  6:31 PM  Result Value Ref Range   Glucose-Capillary 257 (H) 70 - 99 mg/dL    Assessment & Plan:   Problem List Items Addressed This Visit   None Visit Diagnoses       Need for vaccination against Streptococcus pneumoniae    -  Primary   Relevant Orders   Pneumococcal conjugate vaccine 20-valent (Prevnar 20) (Completed)        No orders of the defined types were placed in this encounter.   Orders Placed This Encounter  Procedures    Pneumococcal conjugate vaccine 20-valent (Prevnar 20)    Patient Instructions  Prevnar-20 today (pneumonia shot).  Good to see you today Sugars are doing better - continue current medicines.  Schedule repeat eye exam with new diabetes diagnosis.  Double check on follow up plan with Dr Damian [phone]   Return in 3 months for physical.   Follow up plan: Return in about 3 months (around 10/01/2024) for annual exam, prior fasting for blood work.  Anton Blas, MD

## 2024-07-01 NOTE — Patient Instructions (Addendum)
 Prevnar-20 today (pneumonia shot).  Good to see you today Sugars are doing better - continue current medicines.  Schedule repeat eye exam with new diabetes diagnosis.  Double check on follow up plan with Dr Damian [phone]   Return in 3 months for physical.

## 2024-07-02 DIAGNOSIS — E1169 Type 2 diabetes mellitus with other specified complication: Secondary | ICD-10-CM | POA: Insufficient documentation

## 2024-07-02 DIAGNOSIS — L02214 Cutaneous abscess of groin: Secondary | ICD-10-CM | POA: Insufficient documentation

## 2024-07-02 NOTE — Assessment & Plan Note (Signed)
 Regularly sees [facility] cardiology and EP, most recently started on flecainide . Discussing ablation for 07/2024.

## 2024-07-02 NOTE — Assessment & Plan Note (Addendum)
 With new diabetes diagnosis will need to consider statin commencement.

## 2024-07-02 NOTE — Assessment & Plan Note (Signed)
 Hyperglycemia contributed to impaired wound healing.  Treated by ER with keflex  course, with improvement noted.

## 2024-07-02 NOTE — Assessment & Plan Note (Addendum)
 New diagnosis presenting with polydipsia polyuria and hyperglycemia to 600s.  Recent ER records reviewed, recent endocrinology note reviewed (Solum) Now on metformin 1500mg  daily. Congratulated on healthy diet changes to date.  Pending diabetes education classes.  Encouraged he reschedule diabetic eye exam as he notes ongoing blurry vision in setting of recent hyperglycemia.  Appreciate endo care.  Prevnar-20 given today.

## 2024-07-16 ENCOUNTER — Telehealth: Payer: Self-pay

## 2024-07-16 DIAGNOSIS — E1110 Type 2 diabetes mellitus with ketoacidosis without coma: Secondary | ICD-10-CM

## 2024-07-16 NOTE — Progress Notes (Signed)
 Complex Care Management Note  Care Guide Note 07/16/2024 Name: Stanley Kane MRN: [id number] DOB: Dec 15, 1965  Stanley Kane is a 58 y.o. year old male who sees Florencio Kava D, MD for primary care. I reached out to [name] by phone today to offer complex care management services.  [name] was given information about Complex Care Management services today including:   The Complex Care Management services include support from the care team which includes your Nurse Care Manager, Clinical Social Worker, or Pharmacist.  The Complex Care Management team is here to help remove barriers to the health concerns and goals most important to you. Complex Care Management services are voluntary, and the patient may decline or stop services at any time by request to their care team member.   Complex Care Management Consent Status: Patient did not agree to participate in complex care management services at this time.  Follow up plan:  Patient will follow up with PCP after scheduled procedure.  Encounter Outcome:  Patient Refused  [name] [facility], [facility] VBCI Assistant Direct Dial: [phone]  Fax: [phone]

## 2024-07-17 ENCOUNTER — Ambulatory Visit: Admitting: Anesthesiology

## 2024-07-17 ENCOUNTER — Encounter
Admission: RE | Disposition: A | Discharge status: Home / Self Care | Payer: Self-pay | Source: Home / Self Care | Attending: Internal Medicine

## 2024-07-17 ENCOUNTER — Ambulatory Visit
Admission: RE | Admit: 2024-07-17 | Discharge: 2024-07-17 | Disposition: A | Discharge status: Home / Self Care | Attending: Internal Medicine | Admitting: Internal Medicine

## 2024-07-17 DIAGNOSIS — K219 Gastro-esophageal reflux disease without esophagitis: Secondary | ICD-10-CM | POA: Insufficient documentation

## 2024-07-17 DIAGNOSIS — I4811 Longstanding persistent atrial fibrillation: Secondary | ICD-10-CM | POA: Diagnosis not present

## 2024-07-17 DIAGNOSIS — Z833 Family history of diabetes mellitus: Secondary | ICD-10-CM | POA: Insufficient documentation

## 2024-07-17 DIAGNOSIS — Z87891 Personal history of nicotine dependence: Secondary | ICD-10-CM | POA: Diagnosis not present

## 2024-07-17 DIAGNOSIS — E66811 Obesity, class 1: Secondary | ICD-10-CM | POA: Diagnosis not present

## 2024-07-17 DIAGNOSIS — I4891 Unspecified atrial fibrillation: Secondary | ICD-10-CM | POA: Diagnosis present

## 2024-07-17 DIAGNOSIS — Z6838 Body mass index (BMI) 38.0-38.9, adult: Secondary | ICD-10-CM | POA: Insufficient documentation

## 2024-07-17 DIAGNOSIS — Z7984 Long term (current) use of oral hypoglycemic drugs: Secondary | ICD-10-CM | POA: Diagnosis not present

## 2024-07-17 DIAGNOSIS — I4819 Other persistent atrial fibrillation: Secondary | ICD-10-CM

## 2024-07-17 DIAGNOSIS — I4892 Unspecified atrial flutter: Secondary | ICD-10-CM | POA: Diagnosis not present

## 2024-07-17 DIAGNOSIS — I420 Dilated cardiomyopathy: Secondary | ICD-10-CM | POA: Insufficient documentation

## 2024-07-17 DIAGNOSIS — Z7901 Long term (current) use of anticoagulants: Secondary | ICD-10-CM | POA: Diagnosis not present

## 2024-07-17 DIAGNOSIS — R600 Localized edema: Secondary | ICD-10-CM | POA: Diagnosis not present

## 2024-07-17 DIAGNOSIS — G4730 Sleep apnea, unspecified: Secondary | ICD-10-CM | POA: Diagnosis not present

## 2024-07-17 DIAGNOSIS — G4733 Obstructive sleep apnea (adult) (pediatric): Secondary | ICD-10-CM | POA: Insufficient documentation

## 2024-07-17 DIAGNOSIS — I10 Essential (primary) hypertension: Secondary | ICD-10-CM | POA: Insufficient documentation

## 2024-07-17 DIAGNOSIS — E119 Type 2 diabetes mellitus without complications: Secondary | ICD-10-CM | POA: Insufficient documentation

## 2024-07-17 DIAGNOSIS — I5020 Unspecified systolic (congestive) heart failure: Secondary | ICD-10-CM | POA: Diagnosis not present

## 2024-07-17 DIAGNOSIS — Z79899 Other long term (current) drug therapy: Secondary | ICD-10-CM | POA: Insufficient documentation

## 2024-07-17 HISTORY — PX: CARDIOVERSION: SHX1299

## 2024-07-17 LAB — GLUCOSE, CAPILLARY: Glucose-Capillary: 122 mg/dL — ABNORMAL HIGH (ref 70–99)

## 2024-07-17 SURGERY — CARDIOVERSION
Anesthesia: General

## 2024-07-17 MED ORDER — PROPOFOL 10 MG/ML IV BOLUS
INTRAVENOUS | Status: AC
  Filled 2024-07-17: qty 40

## 2024-07-17 MED ORDER — METOPROLOL SUCCINATE ER 25 MG PO TB24: 25.0000 mg | ORAL_TABLET | Freq: Every day | ORAL | 0 refills | Status: DC

## 2024-07-17 MED ORDER — PROPOFOL 10 MG/ML IV BOLUS
INTRAVENOUS | Status: DC | PRN
  Administered 2024-07-17: 70 mg via INTRAVENOUS
  Administered 2024-07-17 (×2): 10 mg via INTRAVENOUS

## 2024-07-17 MED ORDER — SODIUM CHLORIDE 0.9 % IV SOLN: INTRAVENOUS | Status: DC

## 2024-07-17 NOTE — Anesthesia Preprocedure Evaluation (Addendum)
 Anesthesia Evaluation  Patient identified by MRN, date of birth, ID band Patient awake    Reviewed: Allergy & Precautions, NPO status , Patient's Chart, lab work & pertinent test results  History of Anesthesia Complications (+) DIFFICULT AIRWAY and history of anesthetic complications  Airway Mallampati: III  TM Distance: <3 FB Neck ROM: full    Dental  (+) Chipped   Pulmonary neg shortness of breath, sleep apnea , Patient abstained from smoking., former smoker   Pulmonary exam normal        Cardiovascular Exercise Tolerance: Good + dysrhythmias Atrial Fibrillation  Rhythm:irregular Rate:Normal     Neuro/Psych  PSYCHIATRIC DISORDERS       Neuromuscular disease    GI/Hepatic negative GI ROS, Neg liver ROS,neg GERD  ,,  Endo/Other  diabetes, Type 2, Oral Hypoglycemic Agents    Renal/GU Renal disease  negative genitourinary   Musculoskeletal   Abdominal   Peds  Hematology negative hematology ROS (+)   Anesthesia Other Findings Past Medical History: No date: Anxiety No date: Chest pain     Comment:  a.) cCTA 10/17/2016: Ca2+ = 0; b.) MV 09/06/2017: EF               33-44%, no ischemia/infarct; c.) MV 01/15/2020: EF 46%,               no ischemia/infarct; d.) MV 11/29/2021: EF 60%, no               ischemia/infarct No date: Cholelithiasis No date: 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)     Comment:  a.) TTE 05/22/2012: EF 50-55%, no RWMAs, mild LAE, norm               RVSF, mild MR; b.) TTE 09/26/2017: EF 60-65%, no RWMAs,               mild LAE, norm RVSF, triv PR, mild MR/TR; c.) TTE               11/23/2017: EF 45-50%, no RWMAs, mild LAE, norm RVSF,               triv MR/PR, mild TR; d.) TTE 04/13/2023: EF 60-65%, no               RWMAs, mod LAE, norm RVSF, mild MR No date: Depression No date: Difficult intubation 06/2014: Fatty liver No date: History of kidney stones No date:  Kidney stone 07/25/2023: Lipoma of chest wall     Comment:  a.) MRI chest 07/25/2023: 12.1 x 11.8 x 9.4 cm               circumscribed mass to RIGHT chest and lower axilla;               overlies oblique musculature and anterior latissimus               dorsi 12/27/2021: Lipoma of posterior chest wall     Comment:  S/p excision 07/2023- pathology: lipoma (8.5 x 4.5 x 3.6              cm well circumscribed) with fat necrosis    No date: Long-term current use of immunosuppressive biologic agent     Comment:  a.) secukinumab No date: NICM (nonischemic cardiomyopathy) (HCC)     Comment:  a.) EF 05/22/2012: EF 50-55%; b.) TTE 09/26/2014: EF               60-65%; c.) MV  09/06/2017: EF 33-44%; d.) TTE 11/23/2017:              EF 45-50%; e.) MV 01/15/2020: EF 45%; f.) MV 11/29/2021:               EF 60%; g.) TTE 04/13/2023: EF 60-65% No date: Obesity No date: On rivaroxaban  therapy No date: OSA (obstructive sleep apnea)     Comment:  a.) does not utilize nocturnal PAP therapy 2013: Persistent atrial fibrillation (HCC)     Comment:  a.) Dx'd in 2013; b.) CHA2DS2-VASc = 1 (HFmrEF) as of               08/09/2023; b.) s/p DCCV 07/06/2012 (150 J x1); c.) s/p               DCCV 11/25/2014 (150 J x 1); d.) cardiac rate/rhythm               maintained on oral digoxin  + metoprolol  succinate;               chronically anticoagulated using rivaroxaban  No date: Plaque psoriasis     Comment:  a.) Tx'd with secukinumab injections + topical               triamcinolone  No date: Prediabetes 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     Comment:  hospitalization [facility] - stress with father's illness No date: Vitamin D  deficiency  Past Surgical History: ~2005: CARDIOVASCULAR STRESS TEST     Comment:  thinks at [place], told normal 08/29/2014: carotid us      Comment:  no plaque 08/29/1998: COLONOSCOPY     Comment:  WNL, for weight loss after started gym 05/02/2024: COLONOSCOPY; N/A     Comment:  HP,  hemorrhoids, rpt (Mansouraty, Aloha Raddle., MD) 06/01/2023: EXTRACORPOREAL SHOCK WAVE LITHOTRIPSY; Left     Comment:  Procedure: EXTRACORPOREAL SHOCK WAVE LITHOTRIPSY (ESWL);              Surgeon: Francisca Redell BROCKS, MD;  Location: [facility] ORS;                Service: Urology;  Laterality: Left; 08/14/2023: LIPOMA EXCISION; Right     Comment:  Procedure: EXCISION LIPOMA, chest wall;  Surgeon:               Marinda Jayson KIDD, MD;  Location: [facility] ORS;  Service:               General;  Laterality: Right; 08/29/2014: MRI     Comment:  brain - WNL, skull base C1/2 articulation childhood: TONSILLECTOMY AND ADENOIDECTOMY 08/29/2014: US  ECHOCARDIOGRAPHY     Comment:  EF 60%, nl vent fxn, mildly dilated LA, mild MR, mild TR  BMI    Body Mass Index: 38.22 kg/m      Reproductive/Obstetrics negative OB ROS                              Anesthesia Physical Anesthesia Plan  ASA: 3  Anesthesia Plan: General   Post-op Pain Management:    Induction: Intravenous  PONV Risk Score and Plan: Propofol  infusion and TIVA  Airway Management Planned: Natural Airway and Nasal Cannula  Additional Equipment:   Intra-op Plan:   Post-operative Plan:   Informed Consent: I have reviewed the patients History and Physical, chart, labs and discussed the procedure including the risks, benefits and alternatives for the proposed anesthesia with the patient or authorized representative who has indicated his/her  understanding and acceptance.     Dental Advisory Given  Plan Discussed with: Anesthesiologist, CRNA and Surgeon  Anesthesia Plan Comments: (Patient consented for risks of anesthesia including but not limited to:  - adverse reactions to medications - risk of airway placement if required - damage to eyes, teeth, lips or other oral mucosa - nerve damage due to positioning  - sore throat or hoarseness - Damage to heart, brain, nerves, lungs, other parts of body or loss of  life  Patient voiced understanding and assent.)        Anesthesia Quick Evaluation

## 2024-07-17 NOTE — Transfer of Care (Signed)
 Immediate Anesthesia Transfer of Care Note  Patient: HAZEM KENNER  Procedure(s) Performed: CARDIOVERSION  Patient Location: Cath Lab/ Special recovery room 14  Anesthesia Type:General  Level of Consciousness: awake, alert , and drowsy  Airway & Oxygen Therapy: Patient Spontanous Breathing and Patient connected to nasal cannula oxygen  Post-op Assessment: Report given to RN and Post -op Vital signs reviewed and stable  Post vital signs: Reviewed and stable  Last Vitals:  Vitals Value Taken Time  BP 96/58 07/17/24 07:45  Temp 35.8 0748  Pulse 44 07/17/24 07:47  Resp 16 07/17/24 07:47  SpO2 94 % 07/17/24 07:47    Last Pain:  Vitals:   07/17/24 0655  TempSrc: Oral     Reported to RN resuming care of pt. In Special recovery room 14. No complications noted. Pt. Awakening.     Complications: No notable events documented.

## 2024-07-17 NOTE — Anesthesia Postprocedure Evaluation (Signed)
 Anesthesia Post Note  Patient: Stanley Kane  Procedure(s) Performed: CARDIOVERSION  Patient location during evaluation: Specials Recovery Anesthesia Type: General Level of consciousness: awake and alert Pain management: pain level controlled Vital Signs Assessment: post-procedure vital signs reviewed and stable Respiratory status: spontaneous breathing, nonlabored ventilation, respiratory function stable and patient connected to nasal cannula oxygen Cardiovascular status: blood pressure returned to baseline and stable Postop Assessment: no apparent nausea or vomiting Anesthetic complications: no   No notable events documented.   Last Vitals:  Vitals:   07/17/24 0815 07/17/24 0830  BP: 112/70 (!) 88/64  Pulse: (!) 53 (!) 49  Resp: 14 12  Temp:    SpO2: 97% 98%    Last Pain:  Vitals:   07/17/24 0830  TempSrc:   PainSc: 0-No pain                 Fairy POUR Makail Watling

## 2024-07-18 ENCOUNTER — Encounter: Payer: Self-pay | Admitting: Internal Medicine

## 2024-07-31 DIAGNOSIS — R600 Localized edema: Secondary | ICD-10-CM | POA: Diagnosis not present

## 2024-07-31 DIAGNOSIS — Z7901 Long term (current) use of anticoagulants: Secondary | ICD-10-CM | POA: Diagnosis not present

## 2024-07-31 DIAGNOSIS — F411 Generalized anxiety disorder: Secondary | ICD-10-CM | POA: Diagnosis not present

## 2024-07-31 DIAGNOSIS — I10 Essential (primary) hypertension: Secondary | ICD-10-CM | POA: Diagnosis not present

## 2024-07-31 DIAGNOSIS — G4733 Obstructive sleep apnea (adult) (pediatric): Secondary | ICD-10-CM | POA: Diagnosis not present

## 2024-07-31 DIAGNOSIS — K219 Gastro-esophageal reflux disease without esophagitis: Secondary | ICD-10-CM | POA: Diagnosis not present

## 2024-07-31 DIAGNOSIS — E66811 Obesity, class 1: Secondary | ICD-10-CM | POA: Diagnosis not present

## 2024-07-31 DIAGNOSIS — I4821 Permanent atrial fibrillation: Secondary | ICD-10-CM | POA: Diagnosis not present

## 2024-07-31 DIAGNOSIS — I420 Dilated cardiomyopathy: Secondary | ICD-10-CM | POA: Diagnosis not present

## 2024-07-31 DIAGNOSIS — E119 Type 2 diabetes mellitus without complications: Secondary | ICD-10-CM | POA: Diagnosis not present

## 2024-08-06 ENCOUNTER — Encounter: Admitting: Dietician

## 2024-08-08 ENCOUNTER — Telehealth: Payer: Self-pay

## 2024-08-08 DIAGNOSIS — L400 Psoriasis vulgaris: Secondary | ICD-10-CM

## 2024-08-08 NOTE — Telephone Encounter (Addendum)
 New referral placed for pt to continue seeing his regular dermatologist.

## 2024-08-08 NOTE — Telephone Encounter (Signed)
 Copied from CRM #[id number]. Topic: Referral - Request for Referral >> Aug 08, 2024  9:31 AM Burnard DEL wrote: Did the patient discuss referral with their provider in the last year? Yes (If No - schedule appointment) (If Yes - send message)  Appointment offered? No  Type of order/referral and detailed reason for visit: Dermatologist [facility] dermatology    Preference of office, provider, location: [place]  [address] Arlyss If referral order, have you been seen by this specialty before? Yes (If Yes, this issue or another issue? When? Where? Patient has been seeing this dermatologist for the last 2 years for soriasis   He sated that they are now asking for PCP to send over a new referral to them for him to continue to see them  Can we respond through MyChart? Yes

## 2024-08-08 NOTE — Addendum Note (Signed)
 Addended by: RILLA BALLER on: 08/08/2024 06:30 PM   Modules accepted: Orders

## 2024-08-09 NOTE — Telephone Encounter (Signed)
 Referral has been faxed.

## 2024-08-14 DIAGNOSIS — I4811 Longstanding persistent atrial fibrillation: Secondary | ICD-10-CM | POA: Diagnosis not present

## 2024-08-14 DIAGNOSIS — Z7901 Long term (current) use of anticoagulants: Secondary | ICD-10-CM | POA: Diagnosis not present

## 2024-08-14 DIAGNOSIS — I4821 Permanent atrial fibrillation: Secondary | ICD-10-CM | POA: Diagnosis not present

## 2024-08-14 DIAGNOSIS — Z8679 Personal history of other diseases of the circulatory system: Secondary | ICD-10-CM | POA: Diagnosis not present

## 2024-08-14 DIAGNOSIS — G4733 Obstructive sleep apnea (adult) (pediatric): Secondary | ICD-10-CM | POA: Diagnosis not present

## 2024-08-14 DIAGNOSIS — Z01810 Encounter for preprocedural cardiovascular examination: Secondary | ICD-10-CM | POA: Diagnosis not present

## 2024-08-14 NOTE — Telephone Encounter (Signed)
 Referral updated to reflect this being faxed.

## 2024-08-15 DIAGNOSIS — I4811 Longstanding persistent atrial fibrillation: Secondary | ICD-10-CM | POA: Diagnosis not present

## 2024-08-16 DIAGNOSIS — I4821 Permanent atrial fibrillation: Secondary | ICD-10-CM | POA: Diagnosis not present

## 2024-08-17 NOTE — CV Procedure (Signed)
 Electrical Cardioversion Procedure Note   Procedure: Electrical Cardioversion Indications:  Atrial Flutter  Procedure Details Consent: Risks of procedure as well as the alternatives and risks of each were explained to the (patient/caregiver).  Consent for procedure obtained. Time Out: Verified patient identification, verified procedure, site/side was marked, verified correct patient position, special equipment/implants available, medications/allergies/relevent history reviewed, required imaging and test results available.  Performed  Patient placed on cardiac monitor, pulse oximetry, supplemental oxygen as necessary.  Sedation given:  propofol  as per anesthesia Pacer pads placed anterior and posterior chest.  Cardioverted 1 time(s).  Cardioverted at 200J.  Evaluation Findings: Post procedure EKG shows: NSR Complications: None Patient did tolerate procedure well.   Stanley Lovelace MD 07/17/24

## 2024-08-20 ENCOUNTER — Ambulatory Visit: Payer: Self-pay

## 2024-08-20 NOTE — Telephone Encounter (Signed)
 FYI Only or Action Required?: FYI only for provider: ED advised.  Patient was last seen in primary care on 07/01/2024 by Rilla Baller, MD.  Called Nurse Triage reporting Shortness of Breath.  Symptoms began 5 days ago.  Interventions attempted: OTC medications: cough medicine, Rest, hydration, or home remedies, and Other: patient has been using his sister's nebulizer machine.  Symptoms are: gradually worsening.  Triage Disposition: Go to ED Now (Notify PCP)  Patient/caregiver understands and will follow disposition?: Yes                Copied from CRM [id number]. Topic: Clinical - Red Word Triage >> Aug 20, 2024 10:45 AM Rosina BIRCH wrote: Red Word that prompted transfer to Nurse Triage: patient can not hardly breathe and he just had a heart ablation done at [facility] and ever since then he can barely catch his breath Reason for Disposition  [1] MODERATE difficulty breathing (e.g., speaks in phrases, SOB even at rest, pulse 100-120) AND [2] NEW-onset or WORSE than normal  Answer Assessment - Initial Assessment Questions Patient had a heart ablation on 08/15/2024 at [facility]---for Afib Patient states he can hardly walk a short distance without given out of breath Has had a cough and states that it is putting a lot of strain on him when he is coughing He states that this started after the ablation about 5 days ago  Patient currently has using his sister's nebulizer Patient also taking honey, cough medicine  He states that laying down hurts  Patient states he felt a lot worse around the 18th-19th Patient states that he feels like a tickle in his throat or chest Patient wont be home until the end of this week. Patient states that he has a pulse oximeter and it has been reading normal but he states sometimes it looks like the numbers jump around. Patient is currently out of town and is with his sister at her home Patient is advised that the recommendation is that he goes to  the nearest Emergency Room for further evaluation. Patient states his sister is there with him and he will get her to take him to the nearest Emergency Room at this time. He states that they are about 3-4 miles from town where the nearest hospital is located.  Patient is advised to call us  back if anything changes or with any further questions/concerns. Patient is advised that if anything worsens to call 911. Patient verbalized understanding.  Protocols used: Breathing Difficulty-A-AH

## 2024-08-20 NOTE — Telephone Encounter (Signed)
Noted. Will await ER eval.  

## 2024-08-22 ENCOUNTER — Other Ambulatory Visit: Payer: Self-pay

## 2024-08-22 ENCOUNTER — Emergency Department

## 2024-08-22 ENCOUNTER — Observation Stay
Admission: EM | Admit: 2024-08-22 | Discharge: 2024-08-23 | Disposition: A | Discharge status: Home / Self Care | Attending: Osteopathic Medicine | Admitting: Osteopathic Medicine

## 2024-08-22 DIAGNOSIS — F14920 Cocaine use, unspecified with intoxication, uncomplicated: Secondary | ICD-10-CM | POA: Diagnosis not present

## 2024-08-22 DIAGNOSIS — Z794 Long term (current) use of insulin: Secondary | ICD-10-CM | POA: Insufficient documentation

## 2024-08-22 DIAGNOSIS — I110 Hypertensive heart disease with heart failure: Secondary | ICD-10-CM | POA: Diagnosis not present

## 2024-08-22 DIAGNOSIS — Z79899 Other long term (current) drug therapy: Secondary | ICD-10-CM | POA: Insufficient documentation

## 2024-08-22 DIAGNOSIS — Z6837 Body mass index (BMI) 37.0-37.9, adult: Secondary | ICD-10-CM | POA: Diagnosis not present

## 2024-08-22 DIAGNOSIS — E119 Type 2 diabetes mellitus without complications: Secondary | ICD-10-CM | POA: Insufficient documentation

## 2024-08-22 DIAGNOSIS — I5020 Unspecified systolic (congestive) heart failure: Secondary | ICD-10-CM | POA: Diagnosis present

## 2024-08-22 DIAGNOSIS — I5032 Chronic diastolic (congestive) heart failure: Principal | ICD-10-CM | POA: Insufficient documentation

## 2024-08-22 DIAGNOSIS — R918 Other nonspecific abnormal finding of lung field: Secondary | ICD-10-CM | POA: Diagnosis not present

## 2024-08-22 DIAGNOSIS — R0602 Shortness of breath: Secondary | ICD-10-CM | POA: Diagnosis not present

## 2024-08-22 DIAGNOSIS — I5033 Acute on chronic diastolic (congestive) heart failure: Secondary | ICD-10-CM | POA: Diagnosis not present

## 2024-08-22 DIAGNOSIS — I509 Heart failure, unspecified: Secondary | ICD-10-CM | POA: Diagnosis not present

## 2024-08-22 DIAGNOSIS — F411 Generalized anxiety disorder: Secondary | ICD-10-CM

## 2024-08-22 DIAGNOSIS — J90 Pleural effusion, not elsewhere classified: Secondary | ICD-10-CM | POA: Diagnosis not present

## 2024-08-22 DIAGNOSIS — J811 Chronic pulmonary edema: Secondary | ICD-10-CM | POA: Diagnosis not present

## 2024-08-22 HISTORY — DX: Type 2 diabetes mellitus without complications: E11.9

## 2024-08-22 LAB — COMPREHENSIVE METABOLIC PANEL WITH GFR
ALT: 20 U/L (ref 0–44)
AST: 25 U/L (ref 15–41)
Albumin: 3.6 g/dL (ref 3.5–5.0)
Alkaline Phosphatase: 45 U/L (ref 38–126)
Anion gap: 9 (ref 5–15)
BUN: 8 mg/dL (ref 6–20)
CO2: 28 mmol/L (ref 22–32)
Calcium: 9.1 mg/dL (ref 8.9–10.3)
Chloride: 102 mmol/L (ref 98–111)
Creatinine, Ser: 0.88 mg/dL (ref 0.61–1.24)
GFR, Estimated: >60 mL/min
Glucose, Bld: 106 mg/dL — ABNORMAL HIGH (ref 70–99)
Potassium: 4.6 mmol/L (ref 3.5–5.1)
Sodium: 139 mmol/L (ref 135–145)
Total Bilirubin: 0.4 mg/dL (ref 0.0–1.2)
Total Protein: 6.8 g/dL (ref 6.5–8.1)

## 2024-08-22 LAB — CBC
HCT: 34.8 % — ABNORMAL LOW (ref 39.0–52.0)
Hemoglobin: 11.2 g/dL — ABNORMAL LOW (ref 13.0–17.0)
MCH: 30.6 pg (ref 26.0–34.0)
MCHC: 32.2 g/dL (ref 30.0–36.0)
MCV: 95.1 fL (ref 80.0–100.0)
Platelets: 395 K/uL (ref 150–400)
RBC: 3.66 MIL/uL — ABNORMAL LOW (ref 4.22–5.81)
RDW: 13.2 % (ref 11.5–15.5)
WBC: 8.9 K/uL (ref 4.0–10.5)
nRBC: 0 % (ref 0.0–0.2)

## 2024-08-22 LAB — TROPONIN T, HIGH SENSITIVITY
Troponin T High Sensitivity: 19 ng/L (ref 0–19)
Troponin T High Sensitivity: <15 ng/L (ref 0–19)

## 2024-08-22 LAB — PRO BRAIN NATRIURETIC PEPTIDE: Pro Brain Natriuretic Peptide: 688 pg/mL — ABNORMAL HIGH

## 2024-08-22 LAB — DIGOXIN LEVEL: Digoxin Level: 0.8 ng/mL (ref 0.8–2.0)

## 2024-08-22 MED ORDER — POLYETHYLENE GLYCOL 3350 17 GM/SCOOP PO POWD: 119.0000 g | Freq: Every day | ORAL | Status: DC

## 2024-08-22 MED ORDER — ACETAMINOPHEN 325 MG PO TABS
650.0000 mg | ORAL_TABLET | ORAL | Status: DC | PRN
  Administered 2024-08-23: 650 mg via ORAL
  Filled 2024-08-22: qty 2

## 2024-08-22 MED ORDER — RIVAROXABAN 20 MG PO TABS
20.0000 mg | ORAL_TABLET | Freq: Every day | ORAL | Status: DC
  Administered 2024-08-22: 20 mg via ORAL
  Filled 2024-08-22: qty 1

## 2024-08-22 MED ORDER — SODIUM CHLORIDE 0.9% FLUSH: 3.0000 mL | INTRAVENOUS | Status: DC | PRN

## 2024-08-22 MED ORDER — SODIUM CHLORIDE 0.9% FLUSH
3.0000 mL | Freq: Two times a day (BID) | INTRAVENOUS | Status: DC
  Administered 2024-08-22 – 2024-08-23 (×3): 3 mL via INTRAVENOUS

## 2024-08-22 MED ORDER — SODIUM CHLORIDE 0.9 % IV SOLN: 250.0000 mL | INTRAVENOUS | Status: AC | PRN

## 2024-08-22 MED ORDER — FUROSEMIDE 10 MG/ML IJ SOLN
60.0000 mg | Freq: Once | INTRAMUSCULAR | Status: AC
  Administered 2024-08-22: 60 mg via INTRAVENOUS
  Filled 2024-08-22: qty 8

## 2024-08-22 MED ORDER — POLYETHYLENE GLYCOL 3350 17 G PO PACK
17.0000 g | PACK | Freq: Every day | ORAL | Status: DC
  Administered 2024-08-22: 17 g via ORAL
  Filled 2024-08-22: qty 1

## 2024-08-22 MED ORDER — ESCITALOPRAM OXALATE 10 MG PO TABS
20.0000 mg | ORAL_TABLET | Freq: Every day | ORAL | Status: DC
  Administered 2024-08-23: 20 mg via ORAL
  Filled 2024-08-22: qty 2

## 2024-08-22 MED ORDER — METFORMIN HCL 500 MG PO TABS: 1000.0000 mg | ORAL_TABLET | Freq: Every day | ORAL | Status: DC

## 2024-08-22 MED ORDER — ONDANSETRON HCL 4 MG/2ML IJ SOLN: 4.0000 mg | Freq: Four times a day (QID) | INTRAMUSCULAR | Status: DC | PRN

## 2024-08-22 MED ORDER — METOPROLOL SUCCINATE ER 25 MG PO TB24
25.0000 mg | ORAL_TABLET | Freq: Every day | ORAL | Status: DC
  Administered 2024-08-23: 25 mg via ORAL
  Filled 2024-08-22: qty 1

## 2024-08-22 MED ORDER — FLECAINIDE ACETATE 100 MG PO TABS
100.0000 mg | ORAL_TABLET | Freq: Two times a day (BID) | ORAL | Status: DC
  Administered 2024-08-22 – 2024-08-23 (×2): 100 mg via ORAL
  Filled 2024-08-22 (×2): qty 1

## 2024-08-22 MED ORDER — METFORMIN HCL 500 MG PO TABS
500.0000 mg | ORAL_TABLET | Freq: Every day | ORAL | Status: DC
  Administered 2024-08-23: 500 mg via ORAL
  Filled 2024-08-22: qty 1

## 2024-08-22 MED ORDER — GUAIFENESIN ER 600 MG PO TB12
600.0000 mg | ORAL_TABLET | Freq: Two times a day (BID) | ORAL | Status: DC
  Administered 2024-08-22 – 2024-08-23 (×2): 600 mg via ORAL
  Filled 2024-08-22 (×2): qty 1

## 2024-08-22 MED ORDER — DIGOXIN 125 MCG PO TABS
0.1250 mg | ORAL_TABLET | Freq: Every day | ORAL | Status: DC
  Administered 2024-08-23: 0.125 mg via ORAL
  Filled 2024-08-22: qty 1

## 2024-08-22 MED ORDER — FUROSEMIDE 10 MG/ML IJ SOLN
40.0000 mg | Freq: Two times a day (BID) | INTRAMUSCULAR | Status: DC
  Administered 2024-08-23: 40 mg via INTRAVENOUS
  Filled 2024-08-22: qty 4

## 2024-08-22 MED ORDER — PANTOPRAZOLE SODIUM 40 MG PO TBEC
40.0000 mg | DELAYED_RELEASE_TABLET | Freq: Two times a day (BID) | ORAL | Status: DC
  Administered 2024-08-22 – 2024-08-23 (×2): 40 mg via ORAL
  Filled 2024-08-22 (×2): qty 1

## 2024-08-22 NOTE — ED Provider Notes (Signed)
 "  [facility] Provider Note    Event Date/Time   First MD Initiated Contact with Patient 08/22/24 1003     (approximate)   History   Shortness of Breath   HPI  Stanley Kane is a 58 y.o. male with a history of diabetes, CHF, on blood thinners, had an for A-fib about 1 week ago who presents with shortness of breath.  He reports it is much worse when he lies flat, he is sleeping in a recliner.  He feels short of breath with minimal amounts of exertion.  Denies chest pain.  No fever.     Physical Exam   Triage Vital Signs: ED Triage Vitals  Encounter Vitals Group     BP 08/22/24 0955 137/68     Girls Systolic BP Percentile --      Girls Diastolic BP Percentile --      Boys Systolic BP Percentile --      Boys Diastolic BP Percentile --      Pulse Rate 08/22/24 0951 73     Resp 08/22/24 0951 (!) 26     Temp 08/22/24 0951 97.6 F (36.4 C)     Temp Source 08/22/24 0951 Oral     SpO2 08/22/24 0951 93 %     Weight 08/22/24 0954 108.9 kg (240 lb)     Height 08/22/24 0954 1.702 m (5' 7)     Head Circumference --      Peak Flow --      Pain Score 08/22/24 0952 6     Pain Loc --      Pain Education --      Exclude from Growth Chart --     Most recent vital signs: Vitals:   08/22/24 0951 08/22/24 0955  BP:  137/68  Pulse: 73   Resp: (!) 26   Temp: 97.6 F (36.4 C)   SpO2: 93%      General: Awake, no distress.  CV:  Good peripheral perfusion.  Resp:  Mild tachypnea, bibasilar rales Abd:  No distention.  Other:     ED Results / Procedures / Treatments   Labs (all labs ordered are listed, but only abnormal results are displayed) Labs Reviewed  CBC - Abnormal; Notable for the following components:      Result Value   RBC 3.66 (*)    Hemoglobin 11.2 (*)    HCT 34.8 (*)    All other components within normal limits  PRO BRAIN NATRIURETIC PEPTIDE - Abnormal; Notable for the following components:   Pro Brain Natriuretic Peptide 688.0 (*)     All other components within normal limits  COMPREHENSIVE METABOLIC PANEL WITH GFR - Abnormal; Notable for the following components:   Glucose, Bld 106 (*)    All other components within normal limits  DIGOXIN  LEVEL  HIV ANTIBODY (ROUTINE TESTING W REFLEX)  TROPONIN T, HIGH SENSITIVITY  TROPONIN T, HIGH SENSITIVITY     EKG  ED ECG REPORT I, Lamar Price, the attending physician, personally viewed and interpreted this ECG.  Date: 08/22/2024  Rhythm: normal sinus rhythm QRS Axis: normal Intervals: normal ST/T Wave abnormalities: normal Narrative Interpretation: no evidence of acute ischemia    RADIOLOGY Chest x-ray demonstrates mild edema    PROCEDURES:  Critical Care performed:   Procedures   MEDICATIONS ORDERED IN ED: Medications  digoxin  (LANOXIN ) tablet 0.125 mg (has no administration in time range)  flecainide  (TAMBOCOR ) tablet 100 mg (has no administration in time range)  metoprolol  succinate (TOPROL -XL) 24 hr tablet 25 mg (has no administration in time range)  escitalopram  (LEXAPRO ) tablet 20 mg (has no administration in time range)  metFORMIN  (GLUCOPHAGE ) tablet 500 mg (has no administration in time range)    And  metFORMIN  (GLUCOPHAGE ) tablet 1,000 mg (has no administration in time range)  pantoprazole  (PROTONIX ) EC tablet 40 mg (has no administration in time range)  polyethylene glycol powder (GLYCOLAX /MIRALAX ) container 119 g (has no administration in time range)  rivaroxaban  (XARELTO ) tablet 20 mg (has no administration in time range)  sodium chloride  flush (NS) 0.9 % injection 3 mL (has no administration in time range)  sodium chloride  flush (NS) 0.9 % injection 3 mL (has no administration in time range)  0.9 %  sodium chloride  infusion (has no administration in time range)  acetaminophen  (TYLENOL ) tablet 650 mg (has no administration in time range)  ondansetron  (ZOFRAN ) injection 4 mg (has no administration in time range)  furosemide  (LASIX )  injection 60 mg (60 mg Intravenous Given 08/22/24 1106)     IMPRESSION / MDM / ASSESSMENT AND PLAN / ED COURSE  I reviewed the triage vital signs and the nursing notes. Patient's presentation is most consistent with acute presentation with potential threat to life or bodily function.  Patient presents with shortness of breath as described above.  Worse with lying flat, noticed some swelling to the ankles bilaterally as well.  Differential includes pulmonary edema/CHF exacerbation, pneumonia, less likely PE given that he is on blood thinners  Pending labs, x-ray,  X-ray suspicious for early pulmonary edema with bilateral pleural effusions.  BNP pending  Patient's BNP is elevated, I suspect CHF exacerbation as the cause of his significant shortness of breath, will start IV Lasix , consult with the hospitalist team.      FINAL CLINICAL IMPRESSION(S) / ED DIAGNOSES   Final diagnoses:  Acute on chronic congestive heart failure, unspecified heart failure type (HCC)     Rx / DC Orders   ED Discharge Orders     None        Note:  This document was prepared using Dragon voice recognition software and may include unintentional dictation errors.   Arlander Charleston, MD 08/22/24 1246  "

## 2024-08-22 NOTE — ED Notes (Signed)
 Pt resting in stretcher, provided with drink and crackers per request. Message sent to pharmacy regarding outstanding unverified meds.

## 2024-08-22 NOTE — ED Notes (Signed)
 CCMD called to initiate cardiac monitoring.

## 2024-08-22 NOTE — H&P (Signed)
 " History and Physical    Stanley Kane FMW:[id number] DOB: September 30, 1965 DOA: 08/22/2024  PCP: Stanley Baller, MD (Confirm with patient/family/NH records and if not entered, this has to be entered at [facility] point of entry) Patient coming from: Home  I have personally briefly reviewed patient's old medical records in [facility]  Chief Complaint: SOB, ankle swelling  HPI: Stanley Kane is a 57 y.o. male with medical history significant of PAF on Eliquis status post recent ablation on 08/16/2024, chronic HFpEF, IIDM, anxiety/depression, morbid obesity, presented with worsening of shortness of breath.  Patient underwent catheter ablation on A-fib at [facility] last Friday.  Procedure was uneventful and patient went home.  Starting from Friday night, patient started to feel increasing exertional dyspnea, denies any cough.  Symptoms gradually getting worse, in time he felt his body girth has increased and both ankles have swelled up as well.  Denies any chest pain, no fever or chills.  He gained about 5 pounds since Friday.  He however did not take his as needed Lasix .  ED Course: Afebrile, not tachycardia blood pressure 130/60 O2 saturation 93% on room air.  Chest x-ray showed pulmonary congestion.  60 mg Lasix  given in the ED.  Review of Systems: As per HPI otherwise 14 point review of systems negative.    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)    a.) TTE 05/22/2012: EF 50-55%, no RWMAs, mild LAE, norm RVSF, mild MR; b.) TTE 09/26/2017: EF 60-65%, no RWMAs, mild LAE, norm RVSF, triv PR, mild MR/TR; c.) TTE 11/23/2017: EF 45-50%, no RWMAs, mild LAE, norm RVSF, triv MR/PR, mild TR; d.) TTE  04/13/2023: EF 60-65%, no RWMAs, mod LAE, norm RVSF, mild MR   Depression    Diabetes mellitus without complication (HCC)    Difficult intubation    Fatty liver 06/2014   History of kidney stones    Kidney stone    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   a.) MRI chest 07/25/2023: 12.1 x 11.8 x 9.4 cm circumscribed mass to RIGHT chest and lower axilla; overlies oblique musculature and anterior latissimus dorsi   Lipoma of posterior chest wall 12/27/2021   S/p excision 07/2023- pathology: lipoma (8.5 x 4.5 x 3.6 cm well circumscribed) with fat necrosis      Long-term current use of immunosuppressive biologic agent    a.) secukinumab   NICM (nonischemic cardiomyopathy) (HCC)    a.) EF 05/22/2012: EF 50-55%; b.) TTE 09/26/2014: EF 60-65%; c.) MV 09/06/2017: EF 33-44%; d.) TTE 11/23/2017: EF 45-50%; e.) MV 01/15/2020: EF 45%; f.) MV 11/29/2021: EF 60%; g.) TTE 04/13/2023: EF 60-65%   Obesity    On rivaroxaban  therapy    OSA (obstructive sleep apnea)    a.) does not utilize nocturnal PAP therapy   Persistent atrial fibrillation (HCC) 2013   a.) Dx'd in 2013; b.) CHA2DS2-VASc = 1 (HFmrEF) as of 08/09/2023; b.) s/p DCCV 07/06/2012 (150 J x1); c.) s/p DCCV 11/25/2014 (150 J x 1); d.) cardiac rate/rhythm maintained on oral digoxin  + metoprolol  succinate; chronically anticoagulated using rivaroxaban    Plaque psoriasis    a.) Tx'd with secukinumab injections + topical triamcinolone    Prediabetes  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   hospitalization [facility] - stress with father's illness   Vitamin D  deficiency     Past Surgical History:  Procedure Laterality Date   CARDIOVASCULAR STRESS TEST  ~2005   thinks at [facility], told normal   CARDIOVERSION N/A 07/17/2024   Procedure: CARDIOVERSION;  Surgeon: Florencio Cara BIRCH, MD;  Location: [facility] ORS;  Service: Cardiovascular;  Laterality: N/A;   carotid us   08/29/2014   no plaque   COLONOSCOPY  08/29/1998   WNL, for weight loss after started gym    COLONOSCOPY N/A 05/02/2024   HP, hemorrhoids, rpt (Mansouraty, Aloha Raddle., MD)   EXTRACORPOREAL SHOCK WAVE LITHOTRIPSY Left 06/01/2023   Procedure: EXTRACORPOREAL SHOCK WAVE LITHOTRIPSY (ESWL);  Surgeon: Francisca Redell BROCKS, MD;  Location: [facility] ORS;  Service: Urology;  Laterality: Left;   LIPOMA EXCISION Right 08/14/2023   Procedure: EXCISION LIPOMA, chest wall;  Surgeon: Marinda Jayson KIDD, MD;  Location: [facility] ORS;  Service: General;  Laterality: Right;   MRI  08/29/2014   brain - WNL, skull base C1/2 articulation   TONSILLECTOMY AND ADENOIDECTOMY  childhood   US  ECHOCARDIOGRAPHY  08/29/2014   EF 60%, nl vent fxn, mildly dilated LA, mild MR, mild TR     reports that he quit smoking about 28 years ago. His smoking use included cigarettes. He has been exposed to tobacco smoke. He has never used smokeless tobacco. He reports that he does not currently use alcohol after a past usage of about 2.0 standard drinks of alcohol per week. He reports that he does not currently use drugs after having used the following drugs: Cocaine.  Allergies[1]  Family History  Problem Relation Age of Onset   Diabetes Mother    Heart disease Mother    Hypertension Father    Coronary artery disease Father 43   Heart attack Father    Cirrhosis Father    Hypercalcemia Father    CAD Sister 19       deceased from MI   GI Disease Sister    Stroke Neg Hx      Prior to Admission medications  Medication Sig Start Date End Date Taking? Authorizing Provider  AMBULATORY NON FORMULARY MEDICATION Medication Name: Diltiazem  2%/ lidocaine  5%.  Apply pea sized amount three times a day for 8 weeks Patient taking differently: Medication Name: Diltiazem  2%/ lidocaine  5%.  Apply pea sized amount three times a day as needed 02/13/24   Arletta, Camie BRAVO, PA-C  Blood Glucose Monitoring Suppl DEVI 1 each by Does not apply route in the morning, at noon, and at bedtime. May substitute to any manufacturer covered by patient's insurance.  06/13/24   Viviann Pastor, MD  clindamycin (CLEOCIN T) 1 % external solution Apply 1 Application topically 2 (two) times daily.    [provider]  DENTA 5000 PLUS 1.1 % CREA dental cream Place 1 Application onto teeth daily. 06/05/23   [provider]  digoxin  (LANOXIN ) 0.125 MG tablet Take 1 tablet (0.125 mg total) by mouth daily. 06/09/23   Gollan, Timothy J, MD  escitalopram  (LEXAPRO ) 20 MG tablet Take 1 tablet (20 mg total) by mouth daily. 11/21/23   Stanley Baller, MD  flecainide  (TAMBOCOR ) 100 MG tablet Take 1 tablet (100 mg total) by mouth 2 (two) times daily. 07/01/24   Stanley Baller, MD  furosemide  (LASIX ) 20 MG tablet Take 1 tablet (20 mg total) by mouth as needed for edema. 06/09/23   Gollan, Timothy J, MD  Lancets MISC 1 each by  Does not apply route as directed. Dispense based on patient and insurance preference. Use up to four times daily as directed. (FOR ICD-10 E10.9, E11.9). 06/13/24   Viviann Pastor, MD  metFORMIN  (GLUCOPHAGE ) 500 MG tablet Take 1 tablet (500 mg total) by mouth daily with breakfast AND 2 tablets (1,000 mg total) daily with supper. 07/01/24 09/29/24  Stanley Baller, MD  metoprolol  succinate (TOPROL -XL) 25 MG 24 hr tablet Take 1 tablet (25 mg total) by mouth daily. 07/17/24   Hudson, Caralyn, PA-C  pantoprazole  (PROTONIX ) 40 MG tablet Take 1 tablet (40 mg total) by mouth 2 (two) times daily. 02/13/24   Heinz, Sara E, PA-C  Polyethylene Glycol 3350  (MIRALAX  PO) Take 1 Capful by mouth at bedtime.    [provider]  rivaroxaban  (XARELTO ) 20 MG TABS tablet Take 1 tablet (20 mg total) by mouth daily with supper. Patient taking differently: Take 20 mg by mouth daily. 11/13/23   Gollan, Timothy J, MD  Secukinumab, 300 MG Dose, (COSENTYX, 300 MG DOSE,) 150 MG/ML SOSY Inject 2 mLs (300 mg total) into the skin every 30 (thirty) days. 06/26/23   Stanley Baller, MD  Vitamin D , Ergocalciferol , 50000 units CAPS Take 5,000 Units by mouth once a  week. Tuesdays 05/22/23   [provider]  VTAMA 1 % CREA Apply 1 Application topically daily. 01/24/24   [provider]    Physical Exam: Vitals:   08/22/24 0951 08/22/24 0954 08/22/24 0955  BP:   137/68  Pulse: 73    Resp: (!) 26    Temp: 97.6 F (36.4 C)    TempSrc: Oral    SpO2: 93%    Weight:  108.9 kg   Height:  5' 7 (1.702 m)     Constitutional: NAD, calm, comfortable Vitals:   08/22/24 0951 08/22/24 0954 08/22/24 0955  BP:   137/68  Pulse: 73    Resp: (!) 26    Temp: 97.6 F (36.4 C)    TempSrc: Oral    SpO2: 93%    Weight:  108.9 kg   Height:  5' 7 (1.702 m)    Eyes: PERRL, lids and conjunctivae normal ENMT: Mucous membranes are moist. Posterior pharynx clear of any exudate or lesions.Normal dentition.  Neck: normal, supple, no masses, no thyromegaly Respiratory: clear to auscultation bilaterally, no wheezing, fine crackles on bilateral lower fields, increasing respiratory effort. No accessory muscle use.  Cardiovascular: Regular rate and rhythm, no murmurs / rubs / gallops.  2+ extremity edema. 2+ pedal pulses. No carotid bruits.  Abdomen: no tenderness, no masses palpated. No hepatosplenomegaly. Bowel sounds positive.  Musculoskeletal: no clubbing / cyanosis. No joint deformity upper and lower extremities. Good ROM, no contractures. Normal muscle tone.  Skin: no rashes, lesions, ulcers. No induration Neurologic: CN 2-12 grossly intact. Sensation intact, DTR normal. Strength 5/5 in all 4.  Psychiatric: Normal judgment and insight. Alert and oriented x 3. Normal mood.   Labs on Admission: I have personally reviewed following labs and imaging studies  CBC: Recent Labs  Lab 08/22/24 0954  WBC 8.9  HGB 11.2*  HCT 34.8*  MCV 95.1  PLT 395   Basic Metabolic Panel: Recent Labs  Lab 08/22/24 1003  NA 139  K 4.6  CL 102  CO2 28  GLUCOSE 106*  BUN 8  CREATININE 0.88  CALCIUM 9.1   GFR: Estimated Creatinine Clearance: 109 mL/min  (by C-G formula based on SCr of 0.88 mg/dL). Liver Function Tests: Recent Labs  Lab 08/22/24 1003  AST 25  ALT 20  ALKPHOS 45  BILITOT 0.4  PROT 6.8  ALBUMIN 3.6   No results for input(s): LIPASE, AMYLASE in the last 168 hours. No results for input(s): AMMONIA in the last 168 hours. Coagulation Profile: No results for input(s): INR, PROTIME in the last 168 hours. Cardiac Enzymes: No results for input(s): CKTOTAL, CKMB, CKMBINDEX, TROPONINI in the last 168 hours. BNP (last 3 results) Recent Labs    08/22/24 1003  PROBNP 688.0*   HbA1C: No results for input(s): HGBA1C in the last 72 hours. CBG: No results for input(s): GLUCAP in the last 168 hours. Lipid Profile: No results for input(s): CHOL, HDL, LDLCALC, TRIG, CHOLHDL, LDLDIRECT in the last 72 hours. Thyroid  Function Tests: No results for input(s): TSH, T4TOTAL, FREET4, T3FREE, THYROIDAB in the last 72 hours. Anemia Panel: No results for input(s): VITAMINB12, FOLATE, FERRITIN, TIBC, IRON, RETICCTPCT in the last 72 hours. Urine analysis:    Component Value Date/Time   COLORURINE YELLOW (A) 02/21/2023 1224   APPEARANCEUR Clear 06/21/2023 1115   LABSPEC 1.015 02/21/2023 1224   LABSPEC 1.020 09/25/2014 1518   PHURINE 5.0 02/21/2023 1224   GLUCOSEU Negative 06/21/2023 1115   GLUCOSEU Negative 09/25/2014 1518   HGBUR LARGE (A) 02/21/2023 1224   BILIRUBINUR Negative 06/21/2023 1115   BILIRUBINUR Negative 09/25/2014 1518   KETONESUR NEGATIVE 02/21/2023 1224   PROTEINUR Negative 06/21/2023 1115   PROTEINUR NEGATIVE 02/21/2023 1224   NITRITE Negative 06/21/2023 1115   NITRITE NEGATIVE 02/21/2023 1224   LEUKOCYTESUR Negative 06/21/2023 1115   LEUKOCYTESUR NEGATIVE 02/21/2023 1224   LEUKOCYTESUR Negative 09/25/2014 1518    Radiological Exams on Admission: DG Chest 2 View Result Date: 08/22/2024 CLINICAL DATA:  Worsening shortness of breath. EXAM: CHEST - 2 VIEW  COMPARISON:  October 03, 2023 FINDINGS: The heart size and mediastinal contours are within normal limits. Mild diffusely increased interstitial lung markings are seen. Mild areas of atelectasis and/or infiltrate are also noted within the bilateral lung bases, left slightly greater than right. There are small bilateral pleural effusions. No pneumothorax is identified. The visualized skeletal structures are unremarkable. IMPRESSION: 1. Mild interstitial edema. 2. Mild bibasilar atelectasis and/or infiltrate, left slightly greater than right. 3. Small bilateral pleural effusions. Electronically Signed   By: Suzen Dials M.D.   On: 08/22/2024 10:29    EKG: Independently reviewed.  Sinus rhythm, no acute ST changes.  Assessment/Plan Principal Problem:   CHF (congestive heart failure) (HCC) Active Problems:   Heart failure with mildly reduced ejection fraction (HFmrEF) (HCC)  (please populate well all problems here in Problem List. (For example, if patient is on BP meds at home and you resume or decide to hold them, it is a problem that needs to be her. Same for CAD, COPD, HLD and so on)  Acute on chronic HFpEF decompensation - Continue IV diuresis 40 mg IV twice daily x 2 doses, repeat chest x-ray tomorrow, likely can switch back to as needed Lasix  discharge home tomorrow. - Echocardiogram was done less than 5 months ago.  History of PAF status post recent ablation - In sinus rhythm - Continue flecainide , metoprolol  and digoxin  - Continue Xarelto   IIDM - Continue metformin   Morbid obesity - BMI= 37 - Outpatient GLP-1 agonist therapy evaluation  DVT prophylaxis: Xarelto  Code Status: Full code Family Communication: None at bedside Disposition Plan: Expect less than 2 midnight hospital stay Consults called: None Admission status: Telemetry observation   Cort ONEIDA Mana MD [facility] Pager [phone]  08/22/2024, 12:51  PM       [1]  Allergies Allergen Reactions   Vicodin  [Hydrocodone-Acetaminophen ] Other (See Comments)    Makes him jittery   "

## 2024-08-22 NOTE — ED Triage Notes (Signed)
 Pt to ED for SOB since 12/19 getting progressively worse. Worse with supine positioning. Had cardiac ablation on 12/18 at [facility] for a fib. Pt states noticed bilateral ankle swelling since last night. Pain with inspiration like a knife to L chest. Takes Xarelto , took this AM. Respirations are shallow but unlabored. Skin is dry.  Blue top sent.

## 2024-08-23 ENCOUNTER — Other Ambulatory Visit: Payer: Self-pay

## 2024-08-23 ENCOUNTER — Observation Stay

## 2024-08-23 DIAGNOSIS — J90 Pleural effusion, not elsewhere classified: Secondary | ICD-10-CM | POA: Diagnosis not present

## 2024-08-23 DIAGNOSIS — I509 Heart failure, unspecified: Secondary | ICD-10-CM | POA: Diagnosis not present

## 2024-08-23 DIAGNOSIS — I5020 Unspecified systolic (congestive) heart failure: Secondary | ICD-10-CM

## 2024-08-23 LAB — BASIC METABOLIC PANEL WITH GFR
Anion gap: 12 (ref 5–15)
BUN: 10 mg/dL (ref 6–20)
CO2: 26 mmol/L (ref 22–32)
Calcium: 8.8 mg/dL — ABNORMAL LOW (ref 8.9–10.3)
Chloride: 100 mmol/L (ref 98–111)
Creatinine, Ser: 0.87 mg/dL (ref 0.61–1.24)
GFR, Estimated: >60 mL/min
Glucose, Bld: 119 mg/dL — ABNORMAL HIGH (ref 70–99)
Potassium: 3.4 mmol/L — ABNORMAL LOW (ref 3.5–5.1)
Sodium: 138 mmol/L (ref 135–145)

## 2024-08-23 LAB — HIV ANTIBODY (ROUTINE TESTING W REFLEX): HIV Screen 4th Generation wRfx: NONREACTIVE

## 2024-08-23 MED ORDER — DIGOXIN 125 MCG PO TABS: 0.0625 mg | ORAL_TABLET | Freq: Two times a day (BID) | ORAL | Status: DC

## 2024-08-23 MED ORDER — FUROSEMIDE 20 MG PO TABS
ORAL_TABLET | ORAL | 0 refills | Status: AC
  Filled 2024-08-23: qty 45, 30d supply, fill #0

## 2024-08-23 MED ORDER — POTASSIUM CHLORIDE CRYS ER 20 MEQ PO TBCR
40.0000 meq | EXTENDED_RELEASE_TABLET | Freq: Every day | ORAL | 0 refills | Status: AC
  Filled 2024-08-23: qty 10, 5d supply, fill #0

## 2024-08-23 MED ORDER — DEXTROMETHORPHAN-GUAIFENESIN 20-200 MG/20ML PO LIQD
20.0000 mL | ORAL | 0 refills | Status: AC | PRN
  Filled 2024-08-23: qty 118, 2d supply, fill #0

## 2024-08-23 MED ORDER — GUAIFENESIN-DM 100-10 MG/5ML PO SYRP
5.0000 mL | ORAL_SOLUTION | ORAL | Status: DC | PRN
  Administered 2024-08-23: 5 mL via ORAL
  Filled 2024-08-23: qty 10

## 2024-08-23 MED ORDER — METOPROLOL SUCCINATE ER 25 MG PO TB24: 25.0000 mg | ORAL_TABLET | Freq: Two times a day (BID) | ORAL | Status: AC

## 2024-08-23 NOTE — Plan of Care (Signed)

## 2024-08-23 NOTE — Telephone Encounter (Signed)
 Hospitalized with CHF exacerbation

## 2024-08-23 NOTE — Discharge Summary (Incomplete)
 "    Physician Discharge Summary   Patient: Stanley Kane MRN: [id number]  DOB: [birth year]   Admit:     Date of Admission: 08/22/2024 Admitted from: ***   Discharge: Date of discharge: {dischdate:26783} Disposition: {Plan; Disposition:26390} Condition at discharge: {DC Condition:26389}  CODE STATUS: ***     Discharge Physician: Laneta Blunt, DO [facility]     PCP: Rilla Baller, MD  Recommendations for Outpatient Follow-up:  Follow up with PCP Rilla Baller, MD in *** weeks Please obtain labs/tests: *** in *** Please follow up on the following pending results: *** PCP AND OTHER OUTPATIENT PROVIDERS: SEE BELOW FOR SPECIFIC DISCHARGE INSTRUCTIONS PRINTED FOR PATIENT IN ADDITION TO GENERIC AVS PATIENT INFO  ***   Discharge Instructions     (HEART FAILURE PATIENTS) Call MD:  Anytime you have any of the following symptoms: 1) 3 pound weight gain in 24 hours or 5 pounds in 1 week 2) shortness of breath, with or without a dry hacking cough 3) swelling in the hands, feet or stomach 4) if you have to sleep on extra pillows at night in order to breathe.   Complete by: As directed    Diet - low sodium heart healthy   Complete by: As directed    Increase activity slowly   Complete by: As directed          Discharge Diagnoses: Principal Problem:   CHF (congestive heart failure) (HCC) Active Problems:   Heart failure with mildly reduced ejection fraction (HFmrEF) [facility])       Hospital Course: Hospital course / significant events:   HPI: Stanley Kane is a 58 y.o. male with medical history significant of PAF on Eliquis status post recent ablation on 08/16/2024, chronic HFpEF, IIDM, anxiety/depression, morbid obesity, presented with worsening of shortness of breath.   Patient underwent catheter ablation on A-fib at [facility] last Friday.  Procedure was uneventful and patient went home.  Starting from Friday night, patient started to feel  increasing exertional dyspnea, denies any cough.  Symptoms gradually getting worse, in time he felt his body girth has increased and both ankles have swelled up as well.  Denies any chest pain, no fever or chills.  He gained about 5 pounds since Friday.  He however did not take his as needed Lasix .   ED Course: Afebrile, not tachycardia blood pressure 130/60 O2 saturation 93% on room air.  Chest x-ray showed pulmonary congestion.  ***     Consultants:  ***  Procedures/Surgeries: ***      ASSESSMENT & PLAN:       *** based on BMI: Body mass index is 38.98 kg/m.SABRA Significantly low or high BMI is associated with higher medical risk.  Underweight - under 18  overweight - 25 to 29 obese - 30 or more Class 1 obesity: BMI of 30.0 to 34 Class 2 obesity: BMI of 35.0 to 39 Class 3 obesity: BMI of 40.0 to 49 Super Morbid Obesity: BMI 50-59 Super-super Morbid Obesity: BMI 60+ Healthy nutrition and physical activity advised as adjunct to other disease management and risk reduction treatments    DVT prophylaxis: *** IV fluids: *** continuous IV fluids  Nutrition: *** Central lines / other devices: ***  Code Status: *** ACP documentation reviewed: *** none on file in [facility]  TOC needs: *** Medical barriers to dispo: ***. Expected medical readiness for discharge ***.             Discharge Instructions  Allergies as of 08/23/2024  Reactions   Vicodin [hydrocodone-acetaminophen ] Other (See Comments)   Makes him jittery        Medication List     TAKE these medications    AMBULATORY NON FORMULARY MEDICATION Medication Name: Diltiazem  2%/ lidocaine  5%.  Apply pea sized amount three times a day for 8 weeks What changed: additional instructions   Blood Glucose Monitoring Suppl Devi 1 each by Does not apply route in the morning, at noon, and at bedtime. May substitute to any manufacturer covered by patient's insurance.   Cosentyx (300 MG Dose) 150  MG/ML Sosy Generic drug: Secukinumab (300 MG Dose) Inject 2 mLs (300 mg total) into the skin every 30 (thirty) days.   digoxin  0.125 MG tablet Commonly known as: LANOXIN  Take 0.5 tablets (0.0625 mg total) by mouth 2 (two) times daily.   escitalopram  20 MG tablet Commonly known as: Lexapro  Take 1 tablet (20 mg total) by mouth daily.   flecainide  100 MG tablet Commonly known as: TAMBOCOR  Take 1 tablet (100 mg total) by mouth 2 (two) times daily.   furosemide  20 MG tablet Commonly known as: LASIX  Take 2 tablets (40 mg total) by mouth 2 (two) times daily for 3 days, THEN 1 tablet (20 mg total) daily for 27 days. May increase to 1 tablet (40 mg total) by mouth TWICE daily (total daily dose 80 mg) as needed for up to 3 days for increased leg swelling, shortness of breath, weight gain 5+ lbs over 1-2 days. Seek medical care if these symptoms are not improving with increased dose. Start taking on: August 23, 2024 What changed: See the new instructions.   guaiFENesin -dextromethorphan  100-10 MG/5ML syrup Commonly known as: ROBITUSSIN DM Take 10 mLs by mouth every 4 (four) hours as needed for cough.   Lancets Misc 1 each by Does not apply route as directed. Dispense based on patient and insurance preference. Use up to four times daily as directed. (FOR ICD-10 E10.9, E11.9).   metFORMIN  500 MG tablet Commonly known as: GLUCOPHAGE  Take 1 tablet (500 mg total) by mouth daily with breakfast AND 2 tablets (1,000 mg total) daily with supper.   metoprolol  succinate 25 MG 24 hr tablet Commonly known as: TOPROL -XL Take 1 tablet (25 mg total) by mouth 2 (two) times daily.   pantoprazole  40 MG tablet Commonly known as: PROTONIX  Take 1 tablet (40 mg total) by mouth 2 (two) times daily.   potassium chloride  SA 20 MEQ tablet Commonly known as: KLOR-CON  M Take 2 tablets (40 mEq total) by mouth daily.   rivaroxaban  20 MG Tabs tablet Commonly known as: XARELTO  Take 1 tablet (20 mg total) by  mouth daily with supper. What changed: when to take this   Vitamin D  (Ergocalciferol ) 50000 units Caps Take 5,000 Units by mouth once a week. Tuesdays   Vtama 1 % Crea Generic drug: Tapinarof Apply 1 Application topically daily.         Follow-up Information     Callwood, Dwayne D, MD. Schedule an appointment as soon as possible for a visit.   Specialties: Cardiology, Internal Medicine Why: hospital follow up - Dr Marsa raker Dr Florencio, confirm pt may call office and they will arrange faster follow up than currently scheduled Contact information: [address] [place] [address] [phone]                 Allergies[1]   Subjective: ***   Discharge Exam: BP 106/79 (BP Location: Right Arm)   Pulse 79   Temp  98.2 F (36.8 C) (Oral)   Resp 18   Ht 5' 7 (1.702 m)   Wt 112.9 kg   SpO2 93%   BMI 38.98 kg/m  *** General: Pt is alert, awake, not in acute distress Cardiovascular: RRR, S1/S2 +, no rubs, no gallops Respiratory: CTA bilaterally, no wheezing, no rhonchi Abdominal: Soft, NT, ND, bowel sounds + Extremities: no edema, no cyanosis     The results of significant diagnostics from this hospitalization (including imaging, microbiology, ancillary and laboratory) are listed below for reference.     Microbiology: No results found for this or any previous visit (from the past 240 hours).   Labs: BNP (last 3 results) No results for input(s): BNP in the last 8760 hours. Basic Metabolic Panel: Recent Labs  Lab 08/22/24 1003 08/23/24 0421  NA 139 138  K 4.6 3.4*  CL 102 100  CO2 28 26  GLUCOSE 106* 119*  BUN 8 10  CREATININE 0.88 0.87  CALCIUM 9.1 8.8*   Liver Function Tests: Recent Labs  Lab 08/22/24 1003  AST 25  ALT 20  ALKPHOS 45  BILITOT 0.4  PROT 6.8  ALBUMIN 3.6   No results for input(s): LIPASE, AMYLASE in the last 168 hours. No results for input(s): AMMONIA in the last 168 hours. CBC: Recent  Labs  Lab 08/22/24 0954  WBC 8.9  HGB 11.2*  HCT 34.8*  MCV 95.1  PLT 395   Cardiac Enzymes: No results for input(s): CKTOTAL, CKMB, CKMBINDEX, TROPONINI in the last 168 hours. BNP: Invalid input(s): POCBNP CBG: No results for input(s): GLUCAP in the last 168 hours. D-Dimer No results for input(s): DDIMER in the last 72 hours. Hgb A1c No results for input(s): HGBA1C in the last 72 hours. Lipid Profile No results for input(s): CHOL, HDL, LDLCALC, TRIG, CHOLHDL, LDLDIRECT in the last 72 hours. Thyroid  function studies No results for input(s): TSH, T4TOTAL, T3FREE, THYROIDAB in the last 72 hours.  Invalid input(s): FREET3 Anemia work up No results for input(s): VITAMINB12, FOLATE, FERRITIN, TIBC, IRON, RETICCTPCT in the last 72 hours. Urinalysis    Component Value Date/Time   COLORURINE YELLOW (A) 02/21/2023 1224   APPEARANCEUR Clear 06/21/2023 1115   LABSPEC 1.015 02/21/2023 1224   LABSPEC 1.020 09/25/2014 1518   PHURINE 5.0 02/21/2023 1224   GLUCOSEU Negative 06/21/2023 1115   GLUCOSEU Negative 09/25/2014 1518   HGBUR LARGE (A) 02/21/2023 1224   BILIRUBINUR Negative 06/21/2023 1115   BILIRUBINUR Negative 09/25/2014 1518   KETONESUR NEGATIVE 02/21/2023 1224   PROTEINUR Negative 06/21/2023 1115   PROTEINUR NEGATIVE 02/21/2023 1224   NITRITE Negative 06/21/2023 1115   NITRITE NEGATIVE 02/21/2023 1224   LEUKOCYTESUR Negative 06/21/2023 1115   LEUKOCYTESUR NEGATIVE 02/21/2023 1224   LEUKOCYTESUR Negative 09/25/2014 1518   Sepsis Labs Recent Labs  Lab 08/22/24 0954  WBC 8.9   Microbiology No results found for this or any previous visit (from the past 240 hours). Imaging DG Chest 1 View Result Date: 08/23/2024 CLINICAL DATA:  CHF. EXAM: CHEST  1 VIEW COMPARISON:  08/22/2024 FINDINGS: Cardiopericardial silhouette is at upper limits of normal for size. Interval improvement in aeration at the left base with persistent  patchy airspace disease bilaterally, right greater than left. Tiny right pleural effusion. Telemetry leads overlie the chest. IMPRESSION: Interval improvement in aeration at the left base with persistent patchy basilar airspace disease bilaterally, right greater than left. Electronically Signed   By: Camellia Candle M.D.   On: 08/23/2024 07:21   DG Chest 2  View Result Date: 08/22/2024 CLINICAL DATA:  Worsening shortness of breath. EXAM: CHEST - 2 VIEW COMPARISON:  October 03, 2023 FINDINGS: The heart size and mediastinal contours are within normal limits. Mild diffusely increased interstitial lung markings are seen. Mild areas of atelectasis and/or infiltrate are also noted within the bilateral lung bases, left slightly greater than right. There are small bilateral pleural effusions. No pneumothorax is identified. The visualized skeletal structures are unremarkable. IMPRESSION: 1. Mild interstitial edema. 2. Mild bibasilar atelectasis and/or infiltrate, left slightly greater than right. 3. Small bilateral pleural effusions. Electronically Signed   By: Suzen Dials M.D.   On: 08/22/2024 10:29      Time coordinating discharge: *** 30 minutes  SIGNED:  Akacia Boltz DO [facility]       [1]  Allergies Allergen Reactions   Vicodin [Hydrocodone-Acetaminophen ] Other (See Comments)    Makes him jittery   "

## 2024-08-23 NOTE — Progress Notes (Signed)
 AVS reviewed and given to patient. PIV removed. All questions answered.

## 2024-08-23 NOTE — Discharge Summary (Signed)
 "    Physician Discharge Summary   Patient: Stanley Kane MRN: [id number]  DOB: [birth year]   Admit:     Date of Admission: 08/22/2024 Admitted from: home    Discharge: Date of discharge: 08/23/2024 Disposition: Home Condition at discharge: good  CODE STATUS: FULL CODE     Discharge Physician: Laneta Blunt, DO [facility]     PCP: Rilla Baller, MD  Recommendations for Outpatient Follow-up:  Follow up with PCP Rilla Baller, MD in 2-3 weeks Follow up next week a/ Dr [facility] cardiology     Discharge Instructions     (HEART FAILURE PATIENTS) Call MD:  Anytime you have any of the following symptoms: 1) 3 pound weight gain in 24 hours or 5 pounds in 1 week 2) shortness of breath, with or without a dry hacking cough 3) swelling in the hands, feet or stomach 4) if you have to sleep on extra pillows at night in order to breathe.   Complete by: As directed    Diet - low sodium heart healthy   Complete by: As directed    Increase activity slowly   Complete by: As directed          Discharge Diagnoses: Principal Problem:   CHF (congestive heart failure) (HCC) Active Problems:   Heart failure with mildly reduced ejection fraction (HFmrEF) [facility])       Hospital Course: Hospital course / significant events:   HPI: Stanley Kane is a 58 y.o. male with medical history significant of PAF on Eliquis status post recent ablation on 08/16/2024, chronic HFpEF, IIDM, anxiety/depression, morbid obesity, presented with worsening of shortness of breath.   Patient underwent catheter ablation on A-fib at [facility] last Friday.  Procedure was uneventful and patient went home.  Starting from Friday night, patient started to feel increasing exertional dyspnea, denies any cough.  Symptoms gradually getting worse, in time he felt his body girth has increased and both ankles have swelled up as well.  Denies any chest pain, no fever or chills.  He gained about 5  pounds since Friday.  He however did not take his as needed Lasix .   ED Course: Afebrile, not tachycardia blood pressure 130/60 O2 saturation 93% on room air.  Chest x-ray showed pulmonary congestion.  Diuresed overnight, repeat CXR improved, remains on room air. I d/w Dr [facility] cardiology - pt to call the office and they will arrange expedited follow up        Consultants:  none  Procedures/Surgeries: none      ASSESSMENT & PLAN:   Acute on chronic HFpEF decompensation Back to po lasix , higher dose few days then can reduce to maintenance / increase as needed Follow w/ cardiology Echocardiogram was done less than 5 months ago.   History of PAF status post recent ablation In sinus rhythm but then back into rate controlled afib Follow w/ cardiology Continue flecainide , metoprolol  and digoxin  Continue Xarelto    IIDM Continue metformin    Morbid obesity BMI= 37 Outpatient GLP-1 agonist therapy evaluation             Discharge Instructions  Allergies as of 08/23/2024       Reactions   Vicodin [hydrocodone-acetaminophen ] Other (See Comments)   Makes him jittery        Medication List     TAKE these medications    AMBULATORY NON FORMULARY MEDICATION Medication Name: Diltiazem  2%/ lidocaine  5%.  Apply pea sized amount three times a day for 8 weeks What  changed: additional instructions   Blood Glucose Monitoring Suppl Devi 1 each by Does not apply route in the morning, at noon, and at bedtime. May substitute to any manufacturer covered by patient's insurance.   Cosentyx (300 MG Dose) 150 MG/ML Sosy Generic drug: Secukinumab (300 MG Dose) Inject 2 mLs (300 mg total) into the skin every 30 (thirty) days.   digoxin  0.125 MG tablet Commonly known as: LANOXIN  Take 0.5 tablets (0.0625 mg total) by mouth 2 (two) times daily.   escitalopram  20 MG tablet Commonly known as: Lexapro  Take 1 tablet (20 mg total) by mouth daily.   flecainide  100 MG  tablet Commonly known as: TAMBOCOR  Take 1 tablet (100 mg total) by mouth 2 (two) times daily.   furosemide  20 MG tablet Commonly known as: LASIX  Take 2 tablets (40 mg total) by mouth 2 (two) times daily for 3 days, THEN 1 tablet (20 mg total) daily for 27 days. May increase to 2 tablets (40 mg total) by mouth TWICE daily (total daily dose 80 mg) as needed for up to 3 days for increased leg swelling, shortness of breath, weight gain 5+ lbs over 1-2 days. Seek medical care if these symptoms are not improving with increased dose. Start taking on: August 23, 2024 What changed: See the new instructions.   Lancets Misc 1 each by Does not apply route as directed. Dispense based on patient and insurance preference. Use up to four times daily as directed. (FOR ICD-10 E10.9, E11.9).   metFORMIN  500 MG tablet Commonly known as: GLUCOPHAGE  Take 1 tablet (500 mg total) by mouth daily with breakfast AND 2 tablets (1,000 mg total) daily with supper.   metoprolol  succinate 25 MG 24 hr tablet Commonly known as: TOPROL -XL Take 1 tablet (25 mg total) by mouth 2 (two) times daily.   pantoprazole  40 MG tablet Commonly known as: PROTONIX  Take 1 tablet (40 mg total) by mouth 2 (two) times daily.   potassium chloride  SA 20 MEQ tablet Commonly known as: KLOR-CON  M Take 2 tablets (40 mEq total) by mouth daily.   rivaroxaban  20 MG Tabs tablet Commonly known as: XARELTO  Take 1 tablet (20 mg total) by mouth daily with supper. What changed: when to take this   Robafen DM 20-200 MG/20ML Liqd Generic drug: Dextromethorphan -guaiFENesin  Take 20 mLs by mouth every 4 (four) hours as needed.   Vitamin D  (Ergocalciferol ) 50000 units Caps Take 5,000 Units by mouth once a week. Tuesdays   Vtama 1 % Crea Generic drug: Tapinarof Apply 1 Application topically daily.         Follow-up Information     Callwood, Dwayne D, MD. Schedule an appointment as soon as possible for a visit.   Specialties: Cardiology,  Internal Medicine Why: hospital follow up - Dr Marsa raker Dr Florencio, confirm pt may call office and they will arrange faster follow up than currently scheduled Contact information: [address] [place] [address] [phone]                 Allergies[1]   Subjective: pt reports feelin gbetter today, no SOB on exertion, he is ambulatingwell and tolerating diet    Discharge Exam: BP 106/79 (BP Location: Right Arm)   Pulse 79   Temp 98.2 F (36.8 C) (Oral)   Resp 18   Ht 5' 7 (1.702 m)   Wt 112.9 kg   SpO2 93%   BMI 38.98 kg/m  General: Pt is alert, awake, not in acute distress Cardiovascular: irreg irreg,  S1/S2 +, no rubs, no gallops Respiratory: rales bilateral bases but otherwise good air movement  Abdominal: Soft, NT, ND, bowel sounds + Extremities: + edema, no cyanosis     The results of significant diagnostics from this hospitalization (including imaging, microbiology, ancillary and laboratory) are listed below for reference.     Microbiology: No results found for this or any previous visit (from the past 240 hours).   Labs: BNP (last 3 results) No results for input(s): BNP in the last 8760 hours. Basic Metabolic Panel: Recent Labs  Lab 08/22/24 1003 08/23/24 0421  NA 139 138  K 4.6 3.4*  CL 102 100  CO2 28 26  GLUCOSE 106* 119*  BUN 8 10  CREATININE 0.88 0.87  CALCIUM 9.1 8.8*   Liver Function Tests: Recent Labs  Lab 08/22/24 1003  AST 25  ALT 20  ALKPHOS 45  BILITOT 0.4  PROT 6.8  ALBUMIN 3.6   No results for input(s): LIPASE, AMYLASE in the last 168 hours. No results for input(s): AMMONIA in the last 168 hours. CBC: Recent Labs  Lab 08/22/24 0954  WBC 8.9  HGB 11.2*  HCT 34.8*  MCV 95.1  PLT 395   Cardiac Enzymes: No results for input(s): CKTOTAL, CKMB, CKMBINDEX, TROPONINI in the last 168 hours. BNP: Invalid input(s): POCBNP CBG: No results for input(s): GLUCAP in the last  168 hours. D-Dimer No results for input(s): DDIMER in the last 72 hours. Hgb A1c No results for input(s): HGBA1C in the last 72 hours. Lipid Profile No results for input(s): CHOL, HDL, LDLCALC, TRIG, CHOLHDL, LDLDIRECT in the last 72 hours. Thyroid  function studies No results for input(s): TSH, T4TOTAL, T3FREE, THYROIDAB in the last 72 hours.  Invalid input(s): FREET3 Anemia work up No results for input(s): VITAMINB12, FOLATE, FERRITIN, TIBC, IRON, RETICCTPCT in the last 72 hours. Urinalysis    Component Value Date/Time   COLORURINE YELLOW (A) 02/21/2023 1224   APPEARANCEUR Clear 06/21/2023 1115   LABSPEC 1.015 02/21/2023 1224   LABSPEC 1.020 09/25/2014 1518   PHURINE 5.0 02/21/2023 1224   GLUCOSEU Negative 06/21/2023 1115   GLUCOSEU Negative 09/25/2014 1518   HGBUR LARGE (A) 02/21/2023 1224   BILIRUBINUR Negative 06/21/2023 1115   BILIRUBINUR Negative 09/25/2014 1518   KETONESUR NEGATIVE 02/21/2023 1224   PROTEINUR Negative 06/21/2023 1115   PROTEINUR NEGATIVE 02/21/2023 1224   NITRITE Negative 06/21/2023 1115   NITRITE NEGATIVE 02/21/2023 1224   LEUKOCYTESUR Negative 06/21/2023 1115   LEUKOCYTESUR NEGATIVE 02/21/2023 1224   LEUKOCYTESUR Negative 09/25/2014 1518   Sepsis Labs Recent Labs  Lab 08/22/24 0954  WBC 8.9   Microbiology No results found for this or any previous visit (from the past 240 hours). Imaging DG Chest 1 View Result Date: 08/23/2024 CLINICAL DATA:  CHF. EXAM: CHEST  1 VIEW COMPARISON:  08/22/2024 FINDINGS: Cardiopericardial silhouette is at upper limits of normal for size. Interval improvement in aeration at the left base with persistent patchy airspace disease bilaterally, right greater than left. Tiny right pleural effusion. Telemetry leads overlie the chest. IMPRESSION: Interval improvement in aeration at the left base with persistent patchy basilar airspace disease bilaterally, right greater than left.  Electronically Signed   By: Camellia Candle M.D.   On: 08/23/2024 07:21   DG Chest 2 View Result Date: 08/22/2024 CLINICAL DATA:  Worsening shortness of breath. EXAM: CHEST - 2 VIEW COMPARISON:  October 03, 2023 FINDINGS: The heart size and mediastinal contours are within normal limits. Mild diffusely increased interstitial lung markings are seen.  Mild areas of atelectasis and/or infiltrate are also noted within the bilateral lung bases, left slightly greater than right. There are small bilateral pleural effusions. No pneumothorax is identified. The visualized skeletal structures are unremarkable. IMPRESSION: 1. Mild interstitial edema. 2. Mild bibasilar atelectasis and/or infiltrate, left slightly greater than right. 3. Small bilateral pleural effusions. Electronically Signed   By: Suzen Dials M.D.   On: 08/22/2024 10:29      Time coordinating discharge: over 30 minutes  SIGNED:  Lavona Norsworthy DO [facility]       [1]  Allergies Allergen Reactions   Vicodin [Hydrocodone-Acetaminophen ] Other (See Comments)    Makes him jittery   "

## 2024-08-23 NOTE — Hospital Course (Addendum)
 Hospital course / significant events:   HPI: Stanley Kane is a 58 y.o. male with medical history significant of PAF on Eliquis status post recent ablation on 08/16/2024, chronic HFpEF, IIDM, anxiety/depression, morbid obesity, presented with worsening of shortness of breath.   Patient underwent catheter ablation on A-fib at [facility] last Friday.  Procedure was uneventful and patient went home.  Starting from Friday night, patient started to feel increasing exertional dyspnea, denies any cough.  Symptoms gradually getting worse, in time he felt his body girth has increased and both ankles have swelled up as well.  Denies any chest pain, no fever or chills.  He gained about 5 pounds since Friday.  He however did not take his as needed Lasix .   ED Course: Afebrile, not tachycardia blood pressure 130/60 O2 saturation 93% on room air.  Chest x-ray showed pulmonary congestion.  Diuresed overnight, repeat CXR improved, remains on room air. I d/w Dr [facility] cardiology - pt to call the office and they will arrange expedited follow up        Consultants:  none  Procedures/Surgeries: none      ASSESSMENT & PLAN:   Acute on chronic HFpEF decompensation Back to po lasix , higher dose few days then can reduce to maintenance / increase as needed Follow w/ cardiology Echocardiogram was done less than 5 months ago.   History of PAF status post recent ablation In sinus rhythm but then back into rate controlled afib Follow w/ cardiology Continue flecainide , metoprolol  and digoxin  Continue Xarelto    IIDM Continue metformin    Morbid obesity BMI= 37 Outpatient GLP-1 agonist therapy evaluation

## 2024-08-26 ENCOUNTER — Telehealth: Payer: Self-pay

## 2024-08-26 NOTE — Transitions of Care (Post Inpatient/ED Visit) (Signed)
 "  08/26/2024  Name: Stanley Kane MRN: [id number] DOB: 1966/03/09  Today's TOC FU Call Status: Today's TOC FU Call Status:: Successful TOC FU Call Completed TOC FU Call Complete Date: 08/26/24  Patient's Name and Date of Birth confirmed. DOB, Name  Transition Care Management Follow-up Telephone Call Date of Discharge: 08/23/24 Discharge Facility: [facility]) Type of Discharge: Inpatient Admission Primary Inpatient Discharge Diagnosis:: CHF How have you been since you were released from the hospital?: Better Any questions or concerns?: No  Items Reviewed: Did you receive and understand the discharge instructions provided?: Yes Medications obtained,verified, and reconciled?: Yes (Medications Reviewed) Any new allergies since your discharge?: No Dietary orders reviewed?: NA Do you have support at home?: Yes  Medications Reviewed Today: Medications Reviewed Today     Reviewed by Lavelle Charmaine NOVAK, LPN (Licensed Practical Nurse) on 08/26/24 at 1117  Med List Status: <None>   Medication Order Taking? Sig Documenting Provider Last Dose Status Informant  AMBULATORY NON FORMULARY MEDICATION [id number] No Medication Name: Diltiazem  2%/ lidocaine  5%.  Apply pea sized amount three times a day for 8 weeks  Patient taking differently: Medication Name: Diltiazem  2%/ lidocaine  5%.  Apply pea sized amount three times a day as needed   Arletta Camie FORBES, PA-C Unknown Active Self, Pharmacy Records, Multiple Informants  Blood Glucose Monitoring Suppl DEVI [id number] No 1 each by Does not apply route in the morning, at noon, and at bedtime. May substitute to any manufacturer covered by patient's insurance. Viviann Pastor, MD 08/21/2024 Active Self, Pharmacy Records, Multiple Informants  Dextromethorphan -guaiFENesin  20-200 MG/20ML LIQD [id number]  Take 20 mLs by mouth every 4 (four) hours as needed. Alexander, Natalie, DO  Active   digoxin  (LANOXIN ) 0.125 MG tablet [id number]   Take 0.5 tablets (0.0625 mg total) by mouth 2 (two) times daily. Alexander, Natalie, DO  Active   escitalopram  (LEXAPRO ) 20 MG tablet [id number] No Take 1 tablet (20 mg total) by mouth daily. Rilla Baller, MD 08/21/2024 Active Self, Pharmacy Records, Multiple Informants  flecainide  (TAMBOCOR ) 100 MG tablet [id number] No Take 1 tablet (100 mg total) by mouth 2 (two) times daily. Rilla Baller, MD 08/21/2024 Active Self, Pharmacy Records, Multiple Informants  furosemide  (LASIX ) 20 MG tablet [id number]  Take 2 tablets (40 mg total) by mouth 2 (two) times daily for 3 days, THEN 1 tablet (20 mg total) daily for 27 days. May increase to 2 tablets (40 mg total) by mouth TWICE daily (total daily dose 80 mg) as needed for up to 3 days for increased leg swelling, shortness of breath, weight gain 5+ lbs over 1-2 days. Seek medical care if these symptoms are not improving with increased dose. Alexander, Natalie, DO  Active   Lancets MISC [id number] No 1 each by Does not apply route as directed. Dispense based on patient and insurance preference. Use up to four times daily as directed. (FOR ICD-10 E10.9, E11.9). Viviann Pastor, MD 08/21/2024 Active Self, Pharmacy Records, Multiple Informants  metFORMIN  (GLUCOPHAGE ) 500 MG tablet [id number] No Take 1 tablet (500 mg total) by mouth daily with breakfast AND 2 tablets (1,000 mg total) daily with supper. Rilla Baller, MD 08/21/2024 Active Self, Pharmacy Records, Multiple Informants  metoprolol  succinate (TOPROL -XL) 25 MG 24 hr tablet [id number]  Take 1 tablet (25 mg total) by mouth 2 (two) times daily. Alexander, Natalie, DO  Active   pantoprazole  (PROTONIX ) 40 MG tablet [id number] No Take 1 tablet (40 mg total) by mouth 2 (two) times daily. Arletta Camie FORBES, PA-C  08/21/2024 Active Self, Pharmacy Records, Multiple Informants  potassium chloride  SA (KLOR-CON  M) 20 MEQ tablet [id number]  Take 2 tablets (40 mEq total) by mouth daily. Alexander, Natalie, DO  Active    rivaroxaban  (XARELTO ) 20 MG TABS tablet [id number] No Take 1 tablet (20 mg total) by mouth daily with supper.  Patient taking differently: Take 20 mg by mouth daily.   Gollan, Timothy J, MD 08/21/2024 Active Self, Pharmacy Records, Multiple Informants  Secukinumab, 300 MG Dose, (COSENTYX, 300 MG DOSE,) 150 MG/ML SOSY [id number] No Inject 2 mLs (300 mg total) into the skin every 30 (thirty) days. Rilla Baller, MD Past Month Active Self, Pharmacy Records, Multiple Informants  Vitamin D , Ergocalciferol , 50000 units CAPS [id number] No Take 5,000 Units by mouth once a week. Tuesdays [provider] 08/20/2024 Active Self, Pharmacy Records, Multiple Informants  VTAMA 1 % CREA [id number] No Apply 1 Application topically daily. [provider] 08/21/2024 Active Self, Pharmacy Records, Multiple Informants            Home Care and Equipment/Supplies: Were Home Health Services Ordered?: NA Any new equipment or medical supplies ordered?: NA  Functional Questionnaire: Do you need assistance with bathing/showering or dressing?: No Do you need assistance with meal preparation?: No Do you need assistance with eating?: No Do you have difficulty maintaining continence: No Do you need assistance with getting out of bed/getting out of a chair/moving?: No Do you have difficulty managing or taking your medications?: No  Follow up appointments reviewed: PCP Follow-up appointment confirmed?: (S) NA (Patient declined to schedule additional appt- will follow up with Cardiology) Specialist Hospital Follow-up appointment confirmed?: Yes Follow-Up Specialty Provider:: Cardiology- Dr. Florencio Do you need transportation to your follow-up appointment?: No Do you understand care options if your condition(s) worsen?: Yes-patient verbalized understanding    SIGNATURE Charmaine Bloodgood, LPN [facility] Advisor [facility] You Are. We Are. One [facility] Direct Dial  [phone]  "

## 2024-08-27 DIAGNOSIS — E66811 Obesity, class 1: Secondary | ICD-10-CM | POA: Diagnosis not present

## 2024-08-27 DIAGNOSIS — I10 Essential (primary) hypertension: Secondary | ICD-10-CM | POA: Diagnosis not present

## 2024-08-27 DIAGNOSIS — K219 Gastro-esophageal reflux disease without esophagitis: Secondary | ICD-10-CM | POA: Diagnosis not present

## 2024-08-27 DIAGNOSIS — G4733 Obstructive sleep apnea (adult) (pediatric): Secondary | ICD-10-CM | POA: Diagnosis not present

## 2024-08-27 DIAGNOSIS — Z8679 Personal history of other diseases of the circulatory system: Secondary | ICD-10-CM | POA: Diagnosis not present

## 2024-08-27 DIAGNOSIS — I4811 Longstanding persistent atrial fibrillation: Secondary | ICD-10-CM | POA: Diagnosis not present

## 2024-08-27 DIAGNOSIS — E119 Type 2 diabetes mellitus without complications: Secondary | ICD-10-CM | POA: Diagnosis not present

## 2024-08-27 DIAGNOSIS — R600 Localized edema: Secondary | ICD-10-CM | POA: Diagnosis not present

## 2024-08-27 DIAGNOSIS — E785 Hyperlipidemia, unspecified: Secondary | ICD-10-CM | POA: Diagnosis not present

## 2024-08-27 DIAGNOSIS — I420 Dilated cardiomyopathy: Secondary | ICD-10-CM | POA: Diagnosis not present

## 2024-08-27 DIAGNOSIS — I4821 Permanent atrial fibrillation: Secondary | ICD-10-CM | POA: Diagnosis not present

## 2024-08-27 DIAGNOSIS — Z7901 Long term (current) use of anticoagulants: Secondary | ICD-10-CM | POA: Diagnosis not present

## 2024-09-01 MED ORDER — SODIUM CHLORIDE 0.9 % IV SOLN: INTRAVENOUS | Status: DC

## 2024-09-02 ENCOUNTER — Other Ambulatory Visit: Payer: Self-pay

## 2024-09-02 ENCOUNTER — Ambulatory Visit: Admitting: Anesthesiology

## 2024-09-02 ENCOUNTER — Ambulatory Visit
Admission: RE | Admit: 2024-09-02 | Discharge: 2024-09-02 | Disposition: A | Discharge status: Home / Self Care | Attending: Internal Medicine | Admitting: Internal Medicine

## 2024-09-02 ENCOUNTER — Encounter: Admission: RE | Disposition: A | Payer: Self-pay | Source: Home / Self Care | Attending: Internal Medicine

## 2024-09-02 DIAGNOSIS — I5022 Chronic systolic (congestive) heart failure: Secondary | ICD-10-CM | POA: Diagnosis not present

## 2024-09-02 DIAGNOSIS — Z7984 Long term (current) use of oral hypoglycemic drugs: Secondary | ICD-10-CM | POA: Diagnosis not present

## 2024-09-02 DIAGNOSIS — I4819 Other persistent atrial fibrillation: Secondary | ICD-10-CM

## 2024-09-02 DIAGNOSIS — Z87891 Personal history of nicotine dependence: Secondary | ICD-10-CM | POA: Diagnosis not present

## 2024-09-02 DIAGNOSIS — I4891 Unspecified atrial fibrillation: Secondary | ICD-10-CM | POA: Insufficient documentation

## 2024-09-02 DIAGNOSIS — E119 Type 2 diabetes mellitus without complications: Secondary | ICD-10-CM | POA: Diagnosis not present

## 2024-09-02 DIAGNOSIS — G4730 Sleep apnea, unspecified: Secondary | ICD-10-CM | POA: Insufficient documentation

## 2024-09-02 DIAGNOSIS — I110 Hypertensive heart disease with heart failure: Secondary | ICD-10-CM | POA: Insufficient documentation

## 2024-09-02 DIAGNOSIS — R001 Bradycardia, unspecified: Secondary | ICD-10-CM | POA: Diagnosis not present

## 2024-09-02 HISTORY — PX: CARDIOVERSION: SHX1299

## 2024-09-02 LAB — GLUCOSE, CAPILLARY: Glucose-Capillary: 124 mg/dL — ABNORMAL HIGH (ref 70–99)

## 2024-09-02 MED ORDER — PROPOFOL 10 MG/ML IV BOLUS
INTRAVENOUS | Status: DC | PRN
  Administered 2024-09-02: 60 mg via INTRAVENOUS

## 2024-09-02 NOTE — Anesthesia Preprocedure Evaluation (Signed)
 "                                  Anesthesia Evaluation  Patient identified by MRN, date of birth, ID band Patient awake    Reviewed: Allergy & Precautions, NPO status , Patient's Chart, lab work & pertinent test results  Airway Mallampati: III  TM Distance: >3 FB Neck ROM: full    Dental  (+) Teeth Intact, Missing   Pulmonary neg pulmonary ROS, sleep apnea , former smoker   Pulmonary exam normal breath sounds clear to auscultation       Cardiovascular Exercise Tolerance: Good +CHF  negative cardio ROS Normal cardiovascular exam+ dysrhythmias Atrial Fibrillation  Rhythm:Irregular     Neuro/Psych   Anxiety     negative neurological ROS  negative psych ROS   GI/Hepatic negative GI ROS, Neg liver ROS,,,  Endo/Other  negative endocrine ROSdiabetes, Type 2, Oral Hypoglycemic Agents  Class 3 obesity  Renal/GU negative Renal ROS  negative genitourinary   Musculoskeletal negative musculoskeletal ROS (+)    Abdominal  (+) + obese  Peds negative pediatric ROS (+)  Hematology negative hematology ROS (+)   Anesthesia Other Findings Past Medical History: No date: Anxiety No date: Chest pain     Comment:  a.) cCTA 10/17/2016: Ca2+ = 0; b.) MV 09/06/2017: EF               33-44%, no ischemia/infarct; c.) MV 01/15/2020: EF 46%,               no ischemia/infarct; d.) MV 11/29/2021: EF 60%, no               ischemia/infarct No date: Cholelithiasis No date: 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)     Comment:  a.) TTE 05/22/2012: EF 50-55%, no RWMAs, mild LAE, norm               RVSF, mild MR; b.) TTE 09/26/2017: EF 60-65%, no RWMAs,               mild LAE, norm RVSF, triv PR, mild MR/TR; c.) TTE               11/23/2017: EF 45-50%, no RWMAs, mild LAE, norm RVSF,               triv MR/PR, mild TR; d.) TTE 04/13/2023: EF 60-65%, no               RWMAs, mod LAE, norm RVSF, mild MR No date: Depression No date: Diabetes mellitus  without complication (HCC) No date: Difficult intubation 06/2014: Fatty liver No date: History of kidney stones No date: Kidney stone 07/25/2023: Lipoma of chest wall     Comment:  a.) MRI chest 07/25/2023: 12.1 x 11.8 x 9.4 cm               circumscribed mass to RIGHT chest and lower axilla;               overlies oblique musculature and anterior latissimus               dorsi 12/27/2021: Lipoma of posterior chest wall     Comment:  S/p excision 07/2023- pathology: lipoma (8.5 x 4.5 x 3.6              cm well circumscribed) with fat necrosis    No date: 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     Comment:  a.) secukinumab No date: NICM (nonischemic cardiomyopathy) (HCC)     Comment:  a.) EF 05/22/2012: EF 50-55%; b.) TTE 09/26/2014: EF               60-65%; c.) MV 09/06/2017: EF 33-44%; d.) TTE 11/23/2017:              EF 45-50%; e.) MV 01/15/2020: EF 45%; f.) MV 11/29/2021:               EF 60%; g.) TTE 04/13/2023: EF 60-65% No date: Obesity No date: On rivaroxaban  therapy No date: OSA (obstructive sleep apnea)     Comment:  a.) does not utilize nocturnal PAP therapy 2013: Persistent atrial fibrillation (HCC)     Comment:  a.) Dx'd in 2013; b.) CHA2DS2-VASc = 1 (HFmrEF) as of               08/09/2023; b.) s/p DCCV 07/06/2012 (150 J x1); c.) s/p               DCCV 11/25/2014 (150 J x 1); d.) cardiac rate/rhythm               maintained on oral digoxin  + metoprolol  succinate;               chronically anticoagulated using rivaroxaban  No date: Plaque psoriasis     Comment:  a.) Tx'd with secukinumab injections + topical               triamcinolone  No date: Prediabetes 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     Comment:  hospitalization [facility] - stress with father's illness No date: Vitamin D  deficiency  Past Surgical History: ~2005: CARDIOVASCULAR STRESS TEST     Comment:  thinks at [place], told normal 07/17/2024: CARDIOVERSION; N/A     Comment:  Procedure:  CARDIOVERSION;  Surgeon: Florencio Cara BIRCH,               MD;  Location: [facility] ORS;  Service: Cardiovascular;                Laterality: N/A; 08/29/2014: carotid us      Comment:  no plaque 08/29/1998: COLONOSCOPY     Comment:  WNL, for weight loss after started gym 05/02/2024: COLONOSCOPY; N/A     Comment:  HP, hemorrhoids, rpt (Mansouraty, Aloha Raddle., MD) 06/01/2023: EXTRACORPOREAL SHOCK WAVE LITHOTRIPSY; Left     Comment:  Procedure: EXTRACORPOREAL SHOCK WAVE LITHOTRIPSY (ESWL);              Surgeon: Francisca Redell BROCKS, MD;  Location: [facility] ORS;                Service: Urology;  Laterality: Left; 08/14/2023: LIPOMA EXCISION; Right     Comment:  Procedure: EXCISION LIPOMA, chest wall;  Surgeon:               Marinda Jayson KIDD, MD;  Location: [facility] ORS;  Service:               General;  Laterality: Right; 08/29/2014: MRI     Comment:  brain - WNL, skull base C1/2 articulation childhood: TONSILLECTOMY AND ADENOIDECTOMY 08/29/2014: US  ECHOCARDIOGRAPHY     Comment:  EF 60%, nl vent fxn, mildly dilated LA, mild MR, mild TR  BMI    Body Mass Index: 37.90 kg/m      Reproductive/Obstetrics negative OB ROS  Anesthesia Physical Anesthesia Plan  ASA: 3  Anesthesia Plan: General   Post-op Pain Management:    Induction: Intravenous  PONV Risk Score and Plan: Propofol  infusion and TIVA  Airway Management Planned: Natural Airway and Nasal Cannula  Additional Equipment:   Intra-op Plan:   Post-operative Plan:   Informed Consent: I have reviewed the patients History and Physical, chart, labs and discussed the procedure including the risks, benefits and alternatives for the proposed anesthesia with the patient or authorized representative who has indicated his/her understanding and acceptance.     Dental Advisory Given  Plan Discussed with: CRNA  Anesthesia Plan Comments:         Anesthesia Quick Evaluation  "

## 2024-09-02 NOTE — Anesthesia Postprocedure Evaluation (Signed)
"   Anesthesia Post Note  Patient: Stanley Kane  Procedure(s) Performed: CARDIOVERSION  Patient location during evaluation: PACU Anesthesia Type: General Level of consciousness: awake Pain management: pain level controlled Vital Signs Assessment: post-procedure vital signs reviewed and stable Respiratory status: spontaneous breathing Cardiovascular status: stable Anesthetic complications: no   No notable events documented.   Last Vitals:  Vitals:   09/02/24 1245 09/02/24 1300  BP: 98/66 92/78  Pulse: (!) 54 60  Resp: 20 18  Temp:    SpO2: 97% 97%    Last Pain:  Vitals:   09/02/24 1300  TempSrc:   PainSc: 0-No pain                 VAN STAVEREN,Mahogony Gilchrest      "

## 2024-09-02 NOTE — Transfer of Care (Signed)
 Immediate Anesthesia Transfer of Care Note  Patient: Stanley Kane  Procedure(s) Performed: CARDIOVERSION  Patient Location: PACU  Anesthesia Type:General  Level of Consciousness: awake, alert , and oriented  Airway & Oxygen Therapy: Patient Spontanous Breathing  Post-op Assessment: Report given to RN and Post -op Vital signs reviewed and stable  Post vital signs: stable  Last Vitals:  Vitals Value Taken Time  BP    Temp    Pulse    Resp    SpO2      Last Pain:  Vitals:   09/02/24 1119  TempSrc: Oral  PainSc: 0-No pain         Complications: No notable events documented.

## 2024-09-03 ENCOUNTER — Encounter: Payer: Self-pay | Admitting: Internal Medicine

## 2024-09-05 ENCOUNTER — Telehealth: Payer: Self-pay | Admitting: Family Medicine

## 2024-09-05 NOTE — Telephone Encounter (Signed)
 Referral was faxed last month.  Office does not have referral.  Please refax referral to FAX# [phone]

## 2024-09-05 NOTE — Telephone Encounter (Signed)
 Copied from CRM #[id number]. Topic: Referral - Question >> Sep 05, 2024  9:51 AM Suzen RAMAN wrote: Reason for CRM: per patient [facility] located at [address] [place], [place] [address] needs a new referral faxed prior to his 6 week follow up for insurance purposes.    FAX# [phone]

## 2024-09-06 ENCOUNTER — Other Ambulatory Visit
Admission: RE | Admit: 2024-09-06 | Discharge: 2024-09-06 | Disposition: A | Attending: Dermatology | Admitting: Dermatology

## 2024-09-09 ENCOUNTER — Other Ambulatory Visit
Admission: RE | Admit: 2024-09-09 | Discharge: 2024-09-09 | Disposition: A | Discharge status: Home / Self Care | Source: Ambulatory Visit | Attending: Dermatology | Admitting: Dermatology

## 2024-09-09 DIAGNOSIS — Z79899 Other long term (current) drug therapy: Secondary | ICD-10-CM | POA: Diagnosis present

## 2024-09-09 LAB — COMPREHENSIVE METABOLIC PANEL WITH GFR
ALT: 16 U/L (ref 0–44)
AST: 21 U/L (ref 15–41)
Albumin: 3.8 g/dL (ref 3.5–5.0)
Alkaline Phosphatase: 47 U/L (ref 38–126)
Anion gap: 11 (ref 5–15)
BUN: 10 mg/dL (ref 6–20)
CO2: 24 mmol/L (ref 22–32)
Calcium: 9.3 mg/dL (ref 8.9–10.3)
Chloride: 102 mmol/L (ref 98–111)
Creatinine, Ser: 0.88 mg/dL (ref 0.61–1.24)
GFR, Estimated: >60 mL/min
Glucose, Bld: 168 mg/dL — ABNORMAL HIGH (ref 70–99)
Potassium: 4.1 mmol/L (ref 3.5–5.1)
Sodium: 137 mmol/L (ref 135–145)
Total Bilirubin: 0.6 mg/dL (ref 0.0–1.2)
Total Protein: 7.4 g/dL (ref 6.5–8.1)

## 2024-09-09 LAB — CBC WITH DIFFERENTIAL/PLATELET
Abs Immature Granulocytes: 0.04 K/uL (ref 0.00–0.07)
Basophils Absolute: 0.1 K/uL (ref 0.0–0.1)
Basophils Relative: 1 %
Eosinophils Absolute: 0.3 K/uL (ref 0.0–0.5)
Eosinophils Relative: 4 %
HCT: 37.3 % — ABNORMAL LOW (ref 39.0–52.0)
Hemoglobin: 12.6 g/dL — ABNORMAL LOW (ref 13.0–17.0)
Immature Granulocytes: 1 %
Lymphocytes Relative: 25 %
Lymphs Abs: 2.1 K/uL (ref 0.7–4.0)
MCH: 31.2 pg (ref 26.0–34.0)
MCHC: 33.8 g/dL (ref 30.0–36.0)
MCV: 92.3 fL (ref 80.0–100.0)
Monocytes Absolute: 0.5 K/uL (ref 0.1–1.0)
Monocytes Relative: 6 %
Neutro Abs: 5.4 K/uL (ref 1.7–7.7)
Neutrophils Relative %: 63 %
Platelets: 371 K/uL (ref 150–400)
RBC: 4.04 MIL/uL — ABNORMAL LOW (ref 4.22–5.81)
RDW: 12.8 % (ref 11.5–15.5)
WBC: 8.5 K/uL (ref 4.0–10.5)
nRBC: 0 % (ref 0.0–0.2)

## 2024-09-09 LAB — VITAMIN D 25 HYDROXY (VIT D DEFICIENCY, FRACTURES): Vit D, 25-Hydroxy: 43.1 ng/mL (ref 30–100)

## 2024-09-09 LAB — HEPATITIS PANEL, ACUTE
HCV Ab: NONREACTIVE
Hep A IgM: NONREACTIVE
Hep B C IgM: NONREACTIVE
Hepatitis B Surface Ag: NONREACTIVE

## 2024-09-10 LAB — QUANTIFERON-TB GOLD PLUS (RQFGPL)
QuantiFERON Mitogen Value: 9.3 [IU]/mL
QuantiFERON Nil Value: 0.03 [IU]/mL
QuantiFERON TB1 Ag Value: 0.04 [IU]/mL
QuantiFERON TB2 Ag Value: 0.04 [IU]/mL

## 2024-09-10 LAB — QUANTIFERON-TB GOLD PLUS: QuantiFERON-TB Gold Plus: NEGATIVE

## 2024-09-16 NOTE — CV Procedure (Signed)
 Electrical Cardioversion Procedure Note   Procedure: Electrical Cardioversion Indications:  Atrial Fibrillation  Procedure Details Consent: Risks of procedure as well as the alternatives and risks of each were explained to the (patient/caregiver).  Consent for procedure obtained. Time Out: Verified patient identification, verified procedure, site/side was marked, verified correct patient position, special equipment/implants available, medications/allergies/relevent history reviewed, required imaging and test results available.  Performed  Patient placed on cardiac monitor, pulse oximetry, supplemental oxygen as necessary.  Sedation given: Propofol  as per anesthesia Pacer pads placed anterior and posterior chest.  Cardioverted 1 time(s).  Cardioverted at 200J.  Evaluation Findings: Post procedure EKG shows: NSR Complications: None Patient did tolerate procedure well.  Stanley Kane 09/02/2024

## 2024-09-23 ENCOUNTER — Other Ambulatory Visit: Payer: Self-pay | Admitting: Family Medicine

## 2024-09-23 DIAGNOSIS — E559 Vitamin D deficiency, unspecified: Secondary | ICD-10-CM

## 2024-09-23 DIAGNOSIS — Z125 Encounter for screening for malignant neoplasm of prostate: Secondary | ICD-10-CM

## 2024-09-23 DIAGNOSIS — E1169 Type 2 diabetes mellitus with other specified complication: Secondary | ICD-10-CM

## 2024-09-23 DIAGNOSIS — I4821 Permanent atrial fibrillation: Secondary | ICD-10-CM

## 2024-09-23 DIAGNOSIS — K760 Fatty (change of) liver, not elsewhere classified: Secondary | ICD-10-CM

## 2024-09-25 ENCOUNTER — Other Ambulatory Visit

## 2024-09-25 DIAGNOSIS — K760 Fatty (change of) liver, not elsewhere classified: Secondary | ICD-10-CM | POA: Diagnosis not present

## 2024-09-25 DIAGNOSIS — E785 Hyperlipidemia, unspecified: Secondary | ICD-10-CM

## 2024-09-25 DIAGNOSIS — E1169 Type 2 diabetes mellitus with other specified complication: Secondary | ICD-10-CM | POA: Diagnosis not present

## 2024-09-25 DIAGNOSIS — I4821 Permanent atrial fibrillation: Secondary | ICD-10-CM

## 2024-09-25 DIAGNOSIS — Z125 Encounter for screening for malignant neoplasm of prostate: Secondary | ICD-10-CM | POA: Diagnosis not present

## 2024-09-25 LAB — BASIC METABOLIC PANEL WITH GFR
BUN: 20 mg/dL (ref 6–23)
CO2: 32 meq/L (ref 19–32)
Calcium: 9.7 mg/dL (ref 8.4–10.5)
Chloride: 99 meq/L (ref 96–112)
Creatinine, Ser: 0.99 mg/dL (ref 0.40–1.50)
GFR: 84.3 mL/min
Glucose, Bld: 117 mg/dL — ABNORMAL HIGH (ref 70–99)
Potassium: 4.6 meq/L (ref 3.5–5.1)
Sodium: 137 meq/L (ref 135–145)

## 2024-09-25 LAB — LIPID PANEL
Cholesterol: 132 mg/dL (ref 28–200)
HDL: 39.7 mg/dL
LDL Cholesterol: 59 mg/dL (ref 10–99)
NonHDL: 92.14
Total CHOL/HDL Ratio: 3
Triglycerides: 166 mg/dL — ABNORMAL HIGH (ref 10.0–149.0)
VLDL: 33.2 mg/dL (ref 0.0–40.0)

## 2024-09-25 LAB — CBC WITH DIFFERENTIAL/PLATELET
Basophils Absolute: 0.1 10*3/uL (ref 0.0–0.1)
Basophils Relative: 0.9 % (ref 0.0–3.0)
Eosinophils Absolute: 0.4 10*3/uL (ref 0.0–0.7)
Eosinophils Relative: 3.4 % (ref 0.0–5.0)
HCT: 42.2 % (ref 39.0–52.0)
Hemoglobin: 14.1 g/dL (ref 13.0–17.0)
Lymphocytes Relative: 33.1 % (ref 12.0–46.0)
Lymphs Abs: 3.9 10*3/uL (ref 0.7–4.0)
MCHC: 33.5 g/dL (ref 30.0–36.0)
MCV: 91.8 fl (ref 78.0–100.0)
Monocytes Absolute: 0.8 10*3/uL (ref 0.1–1.0)
Monocytes Relative: 6.8 % (ref 3.0–12.0)
Neutro Abs: 6.5 10*3/uL (ref 1.4–7.7)
Neutrophils Relative %: 55.8 % (ref 43.0–77.0)
Platelets: 354 10*3/uL (ref 150.0–400.0)
RBC: 4.6 Mil/uL (ref 4.22–5.81)
RDW: 13.6 % (ref 11.5–15.5)
WBC: 11.7 10*3/uL — ABNORMAL HIGH (ref 4.0–10.5)

## 2024-09-25 LAB — MICROALBUMIN / CREATININE URINE RATIO
Creatinine,U: 193.3 mg/dL
Microalb Creat Ratio: 4.2 mg/g (ref 0.0–30.0)
Microalb, Ur: 0.8 mg/dL (ref 0.7–1.9)

## 2024-09-25 LAB — VITAMIN B12: Vitamin B-12: 154 pg/mL — ABNORMAL LOW (ref 211–911)

## 2024-09-25 LAB — HEMOGLOBIN A1C: Hgb A1c MFr Bld: 6.6 % — ABNORMAL HIGH (ref 4.6–6.5)

## 2024-09-26 ENCOUNTER — Ambulatory Visit: Payer: Self-pay | Admitting: Family Medicine

## 2024-09-26 LAB — PSA: PSA: 0.65 ng/mL (ref 0.10–4.00)

## 2024-09-26 MED ORDER — VITAMIN B-12 1000 MCG PO TABS: 1000.0000 ug | ORAL_TABLET | Freq: Every day | ORAL | Status: AC

## 2024-09-27 ENCOUNTER — Encounter: Admission: RE | Payer: Self-pay | Source: Home / Self Care

## 2024-09-27 ENCOUNTER — Ambulatory Visit: Admission: RE | Admit: 2024-09-27 | Source: Home / Self Care | Admitting: Internal Medicine

## 2024-10-02 ENCOUNTER — Encounter: Admitting: Family Medicine

## 2024-10-11 ENCOUNTER — Encounter: Admitting: Family Medicine
